# Patient Record
Sex: Female | Born: 1960
Health system: Southern US, Community
[De-identification: ages and names within clinical notes are randomized; demographics above are authoritative.]

## PROBLEM LIST (undated history)

## (undated) DIAGNOSIS — F900 Attention-deficit hyperactivity disorder, predominantly inattentive type: Secondary | ICD-10-CM

## (undated) DIAGNOSIS — S93409A Sprain of unspecified ligament of unspecified ankle, initial encounter: Secondary | ICD-10-CM

## (undated) DIAGNOSIS — C50919 Malignant neoplasm of unspecified site of unspecified female breast: Secondary | ICD-10-CM

## (undated) DIAGNOSIS — Z923 Personal history of irradiation: Secondary | ICD-10-CM

## (undated) DIAGNOSIS — IMO0001 Reserved for inherently not codable concepts without codable children: Secondary | ICD-10-CM

## (undated) DIAGNOSIS — F32A Depression, unspecified: Secondary | ICD-10-CM

## (undated) DIAGNOSIS — E039 Hypothyroidism, unspecified: Secondary | ICD-10-CM

## (undated) DIAGNOSIS — F329 Major depressive disorder, single episode, unspecified: Secondary | ICD-10-CM

## (undated) HISTORY — DX: Malignant neoplasm of unspecified site of unspecified female breast: C50.919

## (undated) HISTORY — PX: WISDOM TOOTH EXTRACTION: SHX21

## (undated) HISTORY — PX: DILATION AND EVACUATION: SHX1459

## (undated) HISTORY — DX: Reserved for inherently not codable concepts without codable children: IMO0001

## (undated) HISTORY — DX: Hypothyroidism, unspecified: E03.9

## (undated) HISTORY — DX: Major depressive disorder, single episode, unspecified: F32.9

## (undated) HISTORY — PX: OTHER SURGICAL HISTORY: SHX169

## (undated) HISTORY — DX: Depression, unspecified: F32.A

## (undated) HISTORY — DX: Attention-deficit hyperactivity disorder, predominantly inattentive type: F90.0

## (undated) HISTORY — DX: Sprain of unspecified ligament of unspecified ankle, initial encounter: S93.409A

## (undated) SURGERY — Surgical Case
Anesthesia: *Unknown

---

## 2000-12-01 ENCOUNTER — Other Ambulatory Visit: Admission: RE | Admit: 2000-12-01 | Discharge: 2000-12-01 | Payer: Self-pay | Admitting: Obstetrics & Gynecology

## 2000-12-03 ENCOUNTER — Encounter: Payer: Self-pay | Admitting: Obstetrics & Gynecology

## 2000-12-03 ENCOUNTER — Encounter: Admission: RE | Admit: 2000-12-03 | Discharge: 2000-12-03 | Payer: Self-pay | Admitting: Obstetrics & Gynecology

## 2000-12-06 ENCOUNTER — Ambulatory Visit (HOSPITAL_COMMUNITY): Admission: RE | Admit: 2000-12-06 | Discharge: 2000-12-06 | Payer: Self-pay | Admitting: Obstetrics & Gynecology

## 2000-12-06 ENCOUNTER — Encounter (INDEPENDENT_AMBULATORY_CARE_PROVIDER_SITE_OTHER): Payer: Self-pay | Admitting: Specialist

## 2000-12-14 ENCOUNTER — Inpatient Hospital Stay (HOSPITAL_COMMUNITY): Admission: AD | Admit: 2000-12-14 | Discharge: 2000-12-14 | Payer: Self-pay | Admitting: Obstetrics and Gynecology

## 2000-12-16 ENCOUNTER — Encounter (INDEPENDENT_AMBULATORY_CARE_PROVIDER_SITE_OTHER): Payer: Self-pay

## 2000-12-16 ENCOUNTER — Ambulatory Visit (HOSPITAL_COMMUNITY): Admission: RE | Admit: 2000-12-16 | Discharge: 2000-12-16 | Payer: Self-pay | Admitting: Obstetrics & Gynecology

## 2002-06-07 ENCOUNTER — Inpatient Hospital Stay (HOSPITAL_COMMUNITY): Admission: AD | Admit: 2002-06-07 | Discharge: 2002-06-07 | Payer: Self-pay | Admitting: Obstetrics and Gynecology

## 2002-06-08 ENCOUNTER — Encounter: Payer: Self-pay | Admitting: Obstetrics and Gynecology

## 2002-08-04 ENCOUNTER — Inpatient Hospital Stay (HOSPITAL_COMMUNITY): Admission: AD | Admit: 2002-08-04 | Discharge: 2002-08-04 | Payer: Self-pay | Admitting: Obstetrics and Gynecology

## 2002-09-26 ENCOUNTER — Inpatient Hospital Stay (HOSPITAL_COMMUNITY): Admission: AD | Admit: 2002-09-26 | Discharge: 2002-09-28 | Payer: Self-pay | Admitting: *Deleted

## 2002-09-29 ENCOUNTER — Encounter: Admission: RE | Admit: 2002-09-29 | Discharge: 2002-10-29 | Payer: Self-pay | Admitting: Obstetrics and Gynecology

## 2002-11-29 ENCOUNTER — Encounter: Admission: RE | Admit: 2002-11-29 | Discharge: 2002-12-29 | Payer: Self-pay | Admitting: Obstetrics and Gynecology

## 2004-08-21 ENCOUNTER — Ambulatory Visit: Payer: Self-pay | Admitting: Internal Medicine

## 2004-09-05 ENCOUNTER — Ambulatory Visit: Payer: Self-pay | Admitting: Internal Medicine

## 2004-12-09 ENCOUNTER — Ambulatory Visit: Payer: Self-pay | Admitting: Internal Medicine

## 2004-12-10 ENCOUNTER — Emergency Department (HOSPITAL_COMMUNITY): Admission: EM | Admit: 2004-12-10 | Discharge: 2004-12-10 | Payer: Self-pay | Admitting: Emergency Medicine

## 2004-12-11 ENCOUNTER — Ambulatory Visit: Payer: Self-pay | Admitting: Internal Medicine

## 2005-03-11 ENCOUNTER — Ambulatory Visit: Payer: Self-pay | Admitting: Internal Medicine

## 2005-07-21 ENCOUNTER — Ambulatory Visit: Payer: Self-pay | Admitting: Internal Medicine

## 2005-10-30 ENCOUNTER — Ambulatory Visit: Payer: Self-pay | Admitting: Internal Medicine

## 2007-05-30 ENCOUNTER — Encounter: Payer: Self-pay | Admitting: Internal Medicine

## 2007-07-15 ENCOUNTER — Telehealth: Payer: Self-pay | Admitting: Internal Medicine

## 2008-07-03 ENCOUNTER — Telehealth: Payer: Self-pay | Admitting: Internal Medicine

## 2009-12-18 ENCOUNTER — Ambulatory Visit: Payer: Self-pay | Admitting: Internal Medicine

## 2010-04-10 ENCOUNTER — Encounter: Payer: Self-pay | Admitting: Internal Medicine

## 2010-07-24 NOTE — Progress Notes (Signed)
Summary: RF  Phone Note Call from Patient Call back at [phone]   Caller: Patient-LIVE CALL Call For: DR K Summary of Call: RF BUDEPRION 87M 1QD. PLEASE CALL [facility] AT [phone]. Initial call taken by: Warnell Forester,  July 15, 2007 12:20 PM  Follow-up for Phone Call        Rx Called In Follow-up by: Raechel Ache, RN,  July 15, 2007 2:19 PM      Prescriptions: WELLBUTRIN XL 300 MG  TB24 (BUPROPION HCL) 1 once daily  #90 x 3   Entered by:   Raechel Ache, RN   Authorized by:   Gordy Savers  MD   Signed by:   Raechel Ache, RN on 07/15/2007   Method used:   Historical   RxID:   [id number]

## 2010-07-24 NOTE — Miscellaneous (Signed)
  Clinical Lists Changes  Medications: Added new medication of WELLBUTRIN XL 300 MG  TB24 (BUPROPION HCL) 1 once daily - Signed Added new medication of CELEXA 40 MG  TABS (CITALOPRAM HYDROBROMIDE) 1 once daily Rx of WELLBUTRIN XL 300 MG  TB24 (BUPROPION HCL) 1 once daily;  #34 x 0;  Signed;  Entered by: Raechel Ache, RN;  Authorized by: Gordy Savers  MD;  Method used: Historical Allergies: Added new allergy or adverse reaction of CODEINE PHOSPHATE (CODEINE PHOSPHATE) Observations: Added new observation of NKA: F (05/30/2007 13:49)    Prescriptions: WELLBUTRIN XL 300 MG  TB24 (BUPROPION HCL) 1 once daily  #34 x 0   Entered by:   Raechel Ache, RN   Authorized by:   Gordy Savers  MD   Signed by:   Raechel Ache, RN on 05/30/2007   Method used:   Historical   RxID:   [id number]

## 2010-07-24 NOTE — Progress Notes (Signed)
Summary: refill  Phone Note Call from Patient Call back at Home Phone [phone]   Caller: pt live Call For: K  Summary of Call: citalopram 40mg , budeprion xl 300 mg [facility]  Initial call taken by: Roselle Locus,  July 03, 2008 2:59 PM  Follow-up for Phone Call        cannot refill- needs OV- has been told this before. Follow-up by: Raechel Ache, RN,  July 03, 2008 3:13 PM  Additional Follow-up for Phone Call Additional follow up Details #1::        lm for patient to schedule appt Roselle Locus  July 03, 2008 4:54 PM

## 2010-07-24 NOTE — Assessment & Plan Note (Signed)
Summary: ankle pain-fup on meds/ccm   Vital Signs:  Patient profile:   50 year old female Height:      63.25 inches Weight:      127 pounds BMI:     22.40 Temp:     97.7 degrees F oral BP sitting:   120 / 78  (right arm) Cuff size:   regular  Vitals Entered By: Duard Brady LPN (December 18, 2009 10:14 AM) CC: c/o (R) ankle swelling and sprain - twisted 1 wk ago going up stairs Is Patient Diabetic? No   CC:  c/o (R) ankle swelling and sprain - twisted 1 wk ago going up stairs.  History of Present Illness: 50 year old patient has not been seen in our office in quite some time.  She does have a long history of depression.  More recently she has been followed by Dr. Allena Napoleon and has been on some thyroid supplementation.  Her depression has been stable.  Her chief complaint today is right ankle pain following a sprain about one week ago.  It is improving daily  Preventive Screening-Counseling & Management  Alcohol-Tobacco     Smoking Status: never  Allergies: 1)  ! Codeine Phosphate (Codeine Phosphate)  Past History:  Past Medical History: Depression  Social History: Smoking Status:  never  Review of Systems       The patient complains of difficulty walking and depression.  The patient denies anorexia, fever, weight loss, weight gain, vision loss, decreased hearing, hoarseness, chest pain, syncope, dyspnea on exertion, peripheral edema, prolonged cough, headaches, hemoptysis, abdominal pain, melena, hematochezia, severe indigestion/heartburn, hematuria, incontinence, genital sores, muscle weakness, suspicious skin lesions, transient blindness, unusual weight change, abnormal bleeding, enlarged lymph nodes, angioedema, and breast masses.    Physical Exam  General:  Well-developed,well-nourished,in no acute distress; alert,appropriate and cooperative throughout examination Msk:  soft tissue swelling and some resolving ecchymoses involving the right ankle both  medially and laterally.  She had no difficulty walking and bearing weight   Impression & Recommendations:  Problem # 1:  ANKLE SPRAIN, RIGHT (ICD-845.00)  Problem # 2:  DEPRESSION (ICD-311)  The following medications were removed from the medication list:    Wellbutrin Xl 300 Mg Tb24 (Bupropion hcl) .Marland Kitchen... 1 once daily    Celexa 40 Mg Tabs (Citalopram hydrobromide) .Marland Kitchen... 1 once daily Her updated medication list for this problem includes:    Bupropion Hcl 300 Mg Xr24h-tab (Bupropion hcl) ..... Qd    Celexa 20 Mg Tabs (Citalopram hydrobromide) ..... Qd  The following medications were removed from the medication list:    Wellbutrin Xl 300 Mg Tb24 (Bupropion hcl) .Marland Kitchen... 1 once daily    Celexa 40 Mg Tabs (Citalopram hydrobromide) .Marland Kitchen... 1 once daily Her updated medication list for this problem includes:    Bupropion Hcl 300 Mg Xr24h-tab (Bupropion hcl) ..... Qd    Celexa 20 Mg Tabs (Citalopram hydrobromide) ..... Qd  Complete Medication List: 1)  Bupropion Hcl 300 Mg Xr24h-tab (Bupropion hcl) .... Qd 2)  Celexa 20 Mg Tabs (Citalopram hydrobromide) .... Qd 3)  Cytomel 25 Mcg Tabs (Liothyronine sodium) .... Q am 4)  Synthroid 75 Mcg Tabs (Levothyroxine sodium) .... Q am  Patient Instructions: 1)  You may move around but avoid painful motions. Apply ice to sore area for 20 minutes 3-4 times a day for 2-3 days. 2)  Please schedule a follow-up appointment as needed. Prescriptions: CELEXA 20 MG TABS (CITALOPRAM HYDROBROMIDE) qd  #90 x 6   Entered and  Authorized by:   Gordy Savers  MD   Signed by:   Gordy Savers  MD on 12/18/2009   Method used:   Print then Give to Patient   RxID:   [id number] BUPROPION HCL 300 MG XR24H-TAB (BUPROPION HCL) qd  #90 x 6   Entered and Authorized by:   Gordy Savers  MD   Signed by:   Gordy Savers  MD on 12/18/2009   Method used:   Print then Give to Patient   RxID:   [id number]

## 2010-07-24 NOTE — Letter (Signed)
Summary: PATIENT HX FORMS  PATIENT HX FORMS   Imported By: Georgian Co 04/10/2010 12:24:42  _____________________________________________________________________  External Attachment:    Type:   Image     Comment:   External Document

## 2010-11-07 NOTE — Op Note (Signed)
[facility]  Patient:    Sarah Hall, Sarah Hall                   MRN: [id number] Proc. Date: 12/06/00 Adm. Date:  [id number] Attending:  Genia Del                           Operative Report  DATE OF BIRTH:                Jan 22, 1961.  PREOPERATIVE DIAGNOSES:       1. Missed abortion around 8 to [redacted] weeks                                  gestation.                               2. Acute toxoplasmosis in first trimester.  POSTOPERATIVE DIAGNOSES:      1. Missed abortion around 8 to [redacted] weeks                                  gestation.                               2. Acute toxoplasmosis in first trimester.  OPERATION:                    Dilatation and evacuation.  SURGEON:                      Genia Del, Hall.D.  ANESTHESIOLOGIST:             Raul Del, Hall.D.  ANESTHESIA:                   MAC and paracervical block.  ESTIMATED BLOOD LOSS:         50 cc.  DESCRIPTION OF PROCEDURE:     Under MAC analgesia, the patient is in lithotomy position. She is prepped with Betadine in the suprapubic, vulvar, and vaginal areas. The bladder is emptied with a catheter and the patient is draped as usual. The vaginal exam reveals an ______ uterus about 8 to [redacted] weeks gestation, mobile, no adnexal mass. The cervix is closed. There is no vaginal bleeding. The speculum is introduced. The anterior lip of the cervix is grasped with a tenaculum and the paracervical block is done with a total of 20 cc of lidocaine 1%, plain at 4 oclock and 8 oclock. We then start dilatation with Hegar dilators up to #29 without difficulty. We then use a curved #9 curet and proceed with suction. Products of conception corresponding to about 8 weeks are brought out. We then use the sharp curet to make sure that no products of conception are left in the cavity. The uterine sound is heard all over. We then suction one last time. The uterus contracts well on the curets.  Hemostasis is adequate. We therefore remove the instruments. Vaginal exam after the procedure confirmed a well-contracted uterus. Estimated blood loss is about 50 cc. No complication occurred and the patient was transferred to recovery room in good status. Note that Rh is negative; a RhoGAM vaccine will therefore be given.  Toxoplasmosis research was requested on products of conception. DD:  12/06/00 TD:  12/07/00 Job: [id number]

## 2010-11-07 NOTE — Op Note (Signed)
[facility]  Patient:    Sarah Hall, Sarah Hall                   MRN: [id number] Proc. Date: 12/16/00 Adm. Date:  [id number] Attending:  Genia Del                           Operative Report  DATE OF BIRTH:                1961-02-03  PREOPERATIVE DIAGNOSIS:       Possible retained products of conception versus endometritis with blood clots.  POSTOPERATIVE DIAGNOSIS:      Possible retained products of conception versus endometritis with blood clots.  OPERATION:                    Dilatation and evacuation.  SURGEON:                      Genia Del, Hall.D.  ANESTHESIOLOGIST:             ______  DESCRIPTION OF PROCEDURE:     Under MAC analgesia, the patient is in the lithotomy position.  She is prepped with Betadine in the suprapubic, vulvar, and vaginal areas.   The bladder is catheterized, and the patient is draped as usual.  The vaginal exam revealed an anteverted uterus slightly increased in volume, mobile, no adnexal mass.  The cervix is closed with no bleeding.  The speculum was inserted.  The paracervical block is done with Xylocaine 1%, 20 cc total at 4 and 8 oclock.  The tenaculum is applied on the anterior lip of the cervix.  Dilatation is achieved with Hegar dilators up to #29 easily.  We then used a #9 curved curet to proceed with suction curettage.  A small amount of products of conception versus dysidria are removed and will be sent to pathology, and toxoplasmosis was requested once again because of acute toxoplasmosis during the first trimester.   We feel that the uterus was contracting well under curet.  The sharp curet had been used to systematically curettage all surfaces of the uterine cavity.  The uterine sound is heard all over.  We then finish with the suction curet to remove any blood clot.  The uterus is contracting very well.  Instruments are removed.  The vaginal exam revealed an anteverted uterus, well  contracted, no adnexal mass.  Hemostasis is adequate.  Estimated blood loss is less than 50 cc.  No complications occurred, and the patient was brought to the recovery room in good status. RhoGAM was received on December 06, 2000, for Rh negative.  The patient will finish doxycycline and Methergine and follow up in office in two weeks. DD:  12/16/00 TD:  12/16/00 Job: [id number]

## 2011-01-08 ENCOUNTER — Other Ambulatory Visit: Payer: Self-pay | Admitting: Internal Medicine

## 2011-03-24 ENCOUNTER — Telehealth: Payer: Self-pay | Admitting: Internal Medicine

## 2011-03-24 NOTE — Telephone Encounter (Signed)
Pt is req to change pcps from Dr Amador Cunas to Dr Tawanna Cooler, because pts spouse is a pt of Dr Tawanna Cooler. Pls advise if ok. Pt needs to get est ov with Dr Tawanna Cooler asap, because pt on thyroid med and she has gained 12 lbs in 1 month.

## 2011-03-24 NOTE — Telephone Encounter (Signed)
ok 

## 2011-03-25 MED ORDER — LEVOTHYROXINE SODIUM 75 MCG PO TABS: 75.0000 ug | ORAL_TABLET | Freq: Every day | ORAL | Status: DC

## 2011-03-25 NOTE — Telephone Encounter (Signed)
Okay to refill thyroid for 30 days.  She needs a 30 minute appointment sometime in the next month for me to see her........ do a physical exam review .  Problem .

## 2011-04-13 ENCOUNTER — Encounter: Payer: Self-pay | Admitting: Family Medicine

## 2011-04-14 ENCOUNTER — Ambulatory Visit (INDEPENDENT_AMBULATORY_CARE_PROVIDER_SITE_OTHER): Payer: BC Managed Care – PPO | Admitting: Family Medicine

## 2011-04-14 ENCOUNTER — Encounter: Payer: Self-pay | Admitting: Family Medicine

## 2011-04-14 ENCOUNTER — Other Ambulatory Visit (HOSPITAL_COMMUNITY)
Admission: RE | Admit: 2011-04-14 | Discharge: 2011-04-14 | Disposition: A | Discharge status: Home / Self Care | Payer: BC Managed Care – PPO | Source: Ambulatory Visit | Attending: Family Medicine | Admitting: Family Medicine

## 2011-04-14 DIAGNOSIS — Z23 Encounter for immunization: Secondary | ICD-10-CM

## 2011-04-14 DIAGNOSIS — Z01419 Encounter for gynecological examination (general) (routine) without abnormal findings: Secondary | ICD-10-CM | POA: Insufficient documentation

## 2011-04-14 DIAGNOSIS — N6019 Diffuse cystic mastopathy of unspecified breast: Secondary | ICD-10-CM

## 2011-04-14 DIAGNOSIS — Z0000 Encounter for general adult medical examination without abnormal findings: Secondary | ICD-10-CM

## 2011-04-14 DIAGNOSIS — F3289 Other specified depressive episodes: Secondary | ICD-10-CM

## 2011-04-14 DIAGNOSIS — E039 Hypothyroidism, unspecified: Secondary | ICD-10-CM

## 2011-04-14 DIAGNOSIS — F329 Major depressive disorder, single episode, unspecified: Secondary | ICD-10-CM

## 2011-04-14 LAB — POCT URINALYSIS DIPSTICK
Bilirubin, UA: NEGATIVE
Glucose, UA: NEGATIVE
Ketones, UA: NEGATIVE
Leukocytes, UA: NEGATIVE
Nitrite, UA: NEGATIVE
Spec Grav, UA: 1.02
Urobilinogen, UA: 0.2
pH, UA: 7

## 2011-04-14 LAB — HEPATIC FUNCTION PANEL
ALT: 23 U/L (ref 0–35)
AST: 28 U/L (ref 0–37)
Albumin: 4.2 g/dL (ref 3.5–5.2)
Alkaline Phosphatase: 58 U/L (ref 39–117)
Bilirubin, Direct: 0 mg/dL (ref 0.0–0.3)
Total Bilirubin: 0.5 mg/dL (ref 0.3–1.2)
Total Protein: 7.2 g/dL (ref 6.0–8.3)

## 2011-04-14 LAB — CBC WITH DIFFERENTIAL/PLATELET
Basophils Absolute: 0 10*3/uL (ref 0.0–0.1)
Basophils Relative: 0.4 % (ref 0.0–3.0)
Eosinophils Absolute: 0.1 10*3/uL (ref 0.0–0.7)
Eosinophils Relative: 1.4 % (ref 0.0–5.0)
HCT: 38.9 % (ref 36.0–46.0)
Hemoglobin: 13.5 g/dL (ref 12.0–15.0)
Lymphocytes Relative: 44.5 % (ref 12.0–46.0)
Lymphs Abs: 1.9 10*3/uL (ref 0.7–4.0)
MCHC: 34.7 g/dL (ref 30.0–36.0)
MCV: 95.4 fL (ref 78.0–100.0)
Monocytes Absolute: 0.5 10*3/uL (ref 0.1–1.0)
Monocytes Relative: 11.9 % (ref 3.0–12.0)
Neutro Abs: 1.8 10*3/uL (ref 1.4–7.7)
Neutrophils Relative %: 41.8 % — ABNORMAL LOW (ref 43.0–77.0)
Platelets: 151 10*3/uL (ref 150.0–400.0)
RBC: 4.08 Mil/uL (ref 3.87–5.11)
RDW: 13.4 % (ref 11.5–14.6)
WBC: 4.2 10*3/uL — ABNORMAL LOW (ref 4.5–10.5)

## 2011-04-14 LAB — TSH: TSH: 3.22 u[IU]/mL (ref 0.35–5.50)

## 2011-04-14 LAB — LIPID PANEL
Cholesterol: 168 mg/dL (ref 0–200)
HDL: 76.6 mg/dL (ref >39.00)
LDL Cholesterol: 73 mg/dL (ref 0–99)
Total CHOL/HDL Ratio: 2
Triglycerides: 94 mg/dL (ref 0.0–149.0)
VLDL: 18.8 mg/dL (ref 0.0–40.0)

## 2011-04-14 LAB — T3, FREE: T3, Free: 2.8 pg/mL (ref 2.3–4.2)

## 2011-04-14 LAB — T4, FREE: Free T4: 0.74 ng/dL (ref 0.60–1.60)

## 2011-04-14 MED ORDER — BUPROPION HCL ER (XL) 300 MG PO TB24: 300.0000 mg | ORAL_TABLET | ORAL | Status: DC

## 2011-04-14 MED ORDER — CITALOPRAM HYDROBROMIDE 20 MG PO TABS: 20.0000 mg | ORAL_TABLET | Freq: Every day | ORAL | Status: DC

## 2011-04-14 NOTE — Progress Notes (Signed)
  Subjective:    Patient ID: Sarah Hall, female    DOB: 04/09/1961, 50 y.o.   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 Kirtland Bouchard.  She has a history of underlying depression, for which she takes Wellbutrin 300 mg daily, and Celexa 20 mg daily.  She's been on this for many years and feels well.  She has been seeing Allena Napoleon and was told she was hypothyroidism and was given Synthroid and Cytomel, however, she stopped those medications.  A year ago, and remains asymptomatic.  Records pending.  Advised that if she was going to see me that she would not also see Dr. [name] at the same time.  She gets routine eye care, dental care, does not check her breasts monthly,,,,,,, history of fibrocystic changes,,,,,,,, last mammogram 9 years ago.  Encouraged monthly BSE and annual mammography.  She does have light-skinned light.  Eyes and sun damage.  Recommend sunscreens SPF 50.  LMP a week ago, normal.  No birth control over one year.  Recommend they use condoms until she goes through menopause.  Tetanus booster and flu shot unknown.  She's had a cold for a week.     Review of Systems  Constitutional: Negative.   HENT: Negative.   Eyes: Negative.   Respiratory: Negative.   Cardiovascular: Negative.   Gastrointestinal: Negative.   Genitourinary: Negative.   Musculoskeletal: Negative.   Neurological: Negative.   Hematological: Negative.   Psychiatric/Behavioral: Negative.        Objective:   Physical Exam  Constitutional: She appears well-developed and well-nourished.  HENT:  Head: Normocephalic and atraumatic.  Right Ear: External ear normal.  Left Ear: External ear normal.  Nose: Nose normal.  Mouth/Throat: Oropharynx is clear and moist.  Eyes: EOM are normal. Pupils are equal, round, and reactive to light.  Neck: Normal range of motion. Neck  supple. No thyromegaly present.  Cardiovascular: Normal rate, regular rhythm, normal heart sounds and intact distal pulses.  Exam reveals no gallop and no friction rub.   No murmur heard. Pulmonary/Chest: Effort normal and breath sounds normal.  Abdominal: Soft. Bowel sounds are normal. She exhibits no distension and no mass. There is no tenderness. There is no rebound.  Genitourinary: Vagina normal and uterus normal. Guaiac negative stool. No vaginal discharge found.       Bilateral breast exam shows the breast to be some at home.  There are diffuse BB size of fibrocystic changes throughout both breasts.  There is a large marble-sized lesion right breast below the nipple.  It soft, rubbery, and movable.  Recommend mammogram ASAP  Musculoskeletal: Normal range of motion.  Lymphadenopathy:    She has no cervical adenopathy.  Neurological: She is alert. She has normal reflexes. No cranial nerve deficit. She exhibits normal muscle tone. Coordination normal.  Skin: Skin is warm and dry.  Psychiatric: She has a normal mood and affect. Her behavior is normal. Judgment and thought content normal.          Assessment & Plan:  Healthy female.  History of depression.  Continue Wellbutrin and Celexa.  History of hypothyroidism.  Check labs.  The fibrocystic breast changes.  Recommend BSE monthly and annual mammography.  Viral syndrome.  Treat symptomatically with Tylenol and Hydromet.  Return one year, sooner if any problems.  Tetanus booster and flu shot given today

## 2011-04-14 NOTE — Patient Instructions (Signed)
Continued ear, good health habits.  Remember to use sunscreens SPF 50.  I would recommend condoms for birth control until you go through menopause.  Return in one year or sooner if any problems.  Do a thorough breast exam monthly and called today to get set up for your annual mammogram.  Continue the Wellbutrin and Celexa.  We will call you in a couple days with report of your lab work

## 2011-06-04 ENCOUNTER — Encounter: Payer: Self-pay | Admitting: Family Medicine

## 2011-06-04 ENCOUNTER — Ambulatory Visit (INDEPENDENT_AMBULATORY_CARE_PROVIDER_SITE_OTHER): Payer: BC Managed Care – PPO | Admitting: Family Medicine

## 2011-06-04 VITALS — BP 120/80 | Temp 98.9°F | Wt 134.0 lb

## 2011-06-04 DIAGNOSIS — N390 Urinary tract infection, site not specified: Secondary | ICD-10-CM

## 2011-06-04 DIAGNOSIS — R300 Dysuria: Secondary | ICD-10-CM

## 2011-06-04 LAB — POCT URINALYSIS DIPSTICK
Bilirubin, UA: NEGATIVE
Blood, UA: NEGATIVE
Nitrite, UA: POSITIVE
Protein, UA: 1
Spec Grav, UA: 1.015
Urobilinogen, UA: 1
pH, UA: 7

## 2011-06-04 MED ORDER — SULFAMETHOXAZOLE-TRIMETHOPRIM 800-160 MG PO TABS: ORAL_TABLET | ORAL | Status: AC

## 2011-06-04 NOTE — Patient Instructions (Signed)
One p.o. B.i.d. Of the Septra x 10 days.  Return p.r.n.

## 2011-06-04 NOTE — Progress Notes (Signed)
  Subjective:    Patient ID: Quynh M [place], female    DOB: [birth year], 50 y.o.   MRN: [id number]  HPI Natalia Leatherwood is a 50 year old, married female, perimenopausal, who comes in today with a UTI.  Last night she began having frequency and dysuria.  No fever, chills, or back pain.  She is going through menopause with dry skin.  Dry, vagina, and hot flushes   Review of Systems General and neurologic review of systems otherwise negative    Objective:   Physical Exam Well-developed well-nourished, female, in no acute distress.  Examination of the abdomen, soft.  Bowel sounds normal.  No tenderness       Assessment & Plan:  UTI plan Septra DS b.i.d. Return p.r.n.  Question Premarin vaginal cream

## 2011-08-20 ENCOUNTER — Other Ambulatory Visit: Payer: Self-pay | Admitting: Family Medicine

## 2011-08-31 ENCOUNTER — Other Ambulatory Visit: Payer: Self-pay | Admitting: Family Medicine

## 2011-08-31 MED ORDER — LIOTHYRONINE SODIUM 25 MCG PO TABS: 25.0000 ug | ORAL_TABLET | Freq: Every day | ORAL | Status: DC

## 2011-08-31 NOTE — Telephone Encounter (Signed)
Pt need refill on cytomel call into [facility]

## 2011-12-31 ENCOUNTER — Ambulatory Visit (INDEPENDENT_AMBULATORY_CARE_PROVIDER_SITE_OTHER): Payer: BC Managed Care – PPO | Admitting: Family Medicine

## 2011-12-31 ENCOUNTER — Encounter: Payer: Self-pay | Admitting: Family Medicine

## 2011-12-31 VITALS — BP 124/80 | Temp 98.3°F | Wt 146.0 lb

## 2011-12-31 DIAGNOSIS — N6019 Diffuse cystic mastopathy of unspecified breast: Secondary | ICD-10-CM

## 2011-12-31 DIAGNOSIS — N951 Menopausal and female climacteric states: Secondary | ICD-10-CM | POA: Insufficient documentation

## 2011-12-31 DIAGNOSIS — F988 Other specified behavioral and emotional disorders with onset usually occurring in childhood and adolescence: Secondary | ICD-10-CM

## 2011-12-31 DIAGNOSIS — F3289 Other specified depressive episodes: Secondary | ICD-10-CM

## 2011-12-31 DIAGNOSIS — F329 Major depressive disorder, single episode, unspecified: Secondary | ICD-10-CM

## 2011-12-31 LAB — CBC WITH DIFFERENTIAL/PLATELET
Basophils Absolute: 0 10*3/uL (ref 0.0–0.1)
Basophils Relative: 0.5 % (ref 0.0–3.0)
Eosinophils Absolute: 0 10*3/uL (ref 0.0–0.7)
Eosinophils Relative: 0.3 % (ref 0.0–5.0)
HCT: 38.8 % (ref 36.0–46.0)
Hemoglobin: 13.1 g/dL (ref 12.0–15.0)
Lymphocytes Relative: 38.6 % (ref 12.0–46.0)
Lymphs Abs: 2.4 10*3/uL (ref 0.7–4.0)
MCHC: 33.7 g/dL (ref 30.0–36.0)
MCV: 94.8 fL (ref 78.0–100.0)
Monocytes Absolute: 0.5 10*3/uL (ref 0.1–1.0)
Monocytes Relative: 8.4 % (ref 3.0–12.0)
Neutro Abs: 3.2 10*3/uL (ref 1.4–7.7)
Neutrophils Relative %: 52.2 % (ref 43.0–77.0)
Platelets: 155 10*3/uL (ref 150.0–400.0)
RBC: 4.09 Mil/uL (ref 3.87–5.11)
RDW: 13.3 % (ref 11.5–14.6)
WBC: 6.2 10*3/uL (ref 4.5–10.5)

## 2011-12-31 LAB — TSH: TSH: 0.99 u[IU]/mL (ref 0.35–5.50)

## 2011-12-31 LAB — POCT URINALYSIS DIPSTICK
Bilirubin, UA: NEGATIVE
Blood, UA: NEGATIVE
Glucose, UA: NEGATIVE
Ketones, UA: NEGATIVE
Leukocytes, UA: NEGATIVE
Nitrite, UA: NEGATIVE
Protein, UA: NEGATIVE
Spec Grav, UA: >=1.03
Urobilinogen, UA: 0.2
pH, UA: 5.5

## 2011-12-31 LAB — BASIC METABOLIC PANEL
BUN: 19 mg/dL (ref 6–23)
CO2: 28 meq/L (ref 19–32)
Calcium: 9.4 mg/dL (ref 8.4–10.5)
Chloride: 104 meq/L (ref 96–112)
Creatinine, Ser: 0.7 mg/dL (ref 0.4–1.2)
GFR: 88.65 mL/min (ref >60.00)
Glucose, Bld: 85 mg/dL (ref 70–99)
Potassium: 3.9 meq/L (ref 3.5–5.1)
Sodium: 140 meq/L (ref 135–145)

## 2011-12-31 MED ORDER — AMPHETAMINE-DEXTROAMPHETAMINE 10 MG PO TABS: 10.0000 mg | ORAL_TABLET | Freq: Two times a day (BID) | ORAL | Status: DC

## 2011-12-31 MED ORDER — ETHYNODIOL DIAC-ETH ESTRADIOL 1-35 MG-MCG PO TABS: 1.0000 | ORAL_TABLET | Freq: Every day | ORAL | Status: DC

## 2011-12-31 NOTE — Patient Instructions (Signed)
Begin the BCPs tonight  Begin the Adderall 10 mg twice daily  Walk 30 minutes daily  Avoid caffeine  Call the breast Center today and get set up for your screening mammogram  Return in 3 weeks for followup

## 2011-12-31 NOTE — Progress Notes (Signed)
  Subjective:    Patient ID: Sarah Hall, female    DOB: Oct 17, 1960, 51 y.o.   MRN: [id number]  HPI Sarah Hall is a 51 year old married female nonsmoker who comes in today for evaluation of multiple issues  At age 51 she is beginning to go through menopause. Periods can range from 5-3 weeks last 4 days in duration and for the last 9 months she's been having hot flushes. No complaints of dry skin nor dry vagina.  She is complaining of fatigue and weight gain  We checked her thyroid levels off Cytomel and Synthroid 9 months ago and they were normal however she continues to take both medications. Advised her to stop those  She takes Celexa 20 mg a day bedtime along with Wellbutrin 300 mg in the morning because of a history of mild depression  She also has ADD. She was diagnosed as a teenager and has been resistant to taking any medication however she gets older her symptoms get worse. She's having difficulty with focus concentrating and getting anything done.  Also when we saw her 9 months ago we noticed a diffuse fibrocystic changes and a lesion in her right breast at 12:00. We advised to get a mammogram and she never went. She does not check her breasts monthly   Review of Systems    general GYN psychiatric review of systems otherwise negative Objective:   Physical Exam Well-developed well nourished female no acute distress ENT was negative neck was supple thyroid was not enlarged lungs clear to auscultation his cardiac exam normal breast exam shows bilateral cystic changes throughout both breasts the largest is a marble-sized lesion right breast at the 12:00 position just above her nipple. Abdominal exam negative total body skin exam normal       Assessment & Plan:  Perimenopausal with hot flashes plan begin BCPs  Adult ADD trial of Adderall 10 mg twice a day  Normal thyroid functions stop the thyroid supplement  History of fibrocystic changes call and get set up for your  mammogram  Followup with me in 3 weeks

## 2012-01-15 ENCOUNTER — Other Ambulatory Visit: Payer: Self-pay | Admitting: Obstetrics and Gynecology

## 2012-01-15 DIAGNOSIS — Z1231 Encounter for screening mammogram for malignant neoplasm of breast: Secondary | ICD-10-CM

## 2012-01-18 ENCOUNTER — Ambulatory Visit (INDEPENDENT_AMBULATORY_CARE_PROVIDER_SITE_OTHER): Payer: BC Managed Care – PPO | Admitting: Family Medicine

## 2012-01-18 ENCOUNTER — Encounter: Payer: Self-pay | Admitting: Family Medicine

## 2012-01-18 VITALS — BP 120/80 | Temp 98.7°F | Wt 142.0 lb

## 2012-01-18 DIAGNOSIS — F988 Other specified behavioral and emotional disorders with onset usually occurring in childhood and adolescence: Secondary | ICD-10-CM

## 2012-01-18 DIAGNOSIS — N951 Menopausal and female climacteric states: Secondary | ICD-10-CM

## 2012-01-18 DIAGNOSIS — N6019 Diffuse cystic mastopathy of unspecified breast: Secondary | ICD-10-CM

## 2012-01-18 MED ORDER — AMPHETAMINE-DEXTROAMPHETAMINE 20 MG PO TABS: ORAL_TABLET | ORAL | Status: DC

## 2012-01-18 NOTE — Patient Instructions (Signed)
Take 20 mg of Adderall daily in the morning and a second 10 mg dose around 2 PM  Return in 4 weeks for followup

## 2012-01-18 NOTE — Progress Notes (Signed)
  Subjective:    Patient ID: Sarah Hall, female    DOB: 25-Sep-1960, 51 y.o.   MRN: [id number]  HPI Sarah Hall is a 51 year old single female nonsmoker who comes in today for followup of 2 problems  She started Adderall 10 mg twice a day and doesn't see much improvement in her focus and concentration. Therefore increase the dose to 20 in the morning 10 in the evening  She also has the pre-menopausal hot flashes and did not start her BCPs. She states now hot flashes are diminishing and she wishes to stay off the birth control pills. She'll normal. The end of June and the beginning of July.  She has a history of fibrocystic breast changes and has rescheduled her mammogram for August.   Review of Systems General and psychiatric review of systems otherwise negative    Objective:   Physical Exam  Well-developed well-nourished female in no acute distress      Assessment & Plan:

## 2012-01-27 ENCOUNTER — Ambulatory Visit: Payer: BC Managed Care – PPO

## 2012-01-27 ENCOUNTER — Telehealth: Payer: Self-pay | Admitting: Family Medicine

## 2012-01-27 NOTE — Telephone Encounter (Signed)
Pt has questions regarding new Adderall RX she recently recieved. Pt would like to be contacted before she fills it Friday

## 2012-01-27 NOTE — Telephone Encounter (Signed)
Patient would like to increase Adderall 20  to twice daily.  She has made an appointment for Monday.

## 2012-02-01 ENCOUNTER — Encounter: Payer: Self-pay | Admitting: Family Medicine

## 2012-02-01 ENCOUNTER — Ambulatory Visit (INDEPENDENT_AMBULATORY_CARE_PROVIDER_SITE_OTHER): Payer: BC Managed Care – PPO | Admitting: Family Medicine

## 2012-02-01 VITALS — BP 120/80 | Temp 98.3°F | Wt 141.0 lb

## 2012-02-01 DIAGNOSIS — F988 Other specified behavioral and emotional disorders with onset usually occurring in childhood and adolescence: Secondary | ICD-10-CM

## 2012-02-01 MED ORDER — AMPHETAMINE-DEXTROAMPHETAMINE 20 MG PO TABS: ORAL_TABLET | ORAL | Status: DC

## 2012-02-01 NOTE — Patient Instructions (Signed)
Increase the Adderall to 20 mg twice daily if you don't see any improvement in this medication call Dr. Andee Poles for consultation

## 2012-02-01 NOTE — Progress Notes (Signed)
  Subjective:    Patient ID: Sarah Hall, female    DOB: 1961-05-29, 51 y.o.   MRN: [id number]  HPI [name] is a 51 year old female who comes in today for followup of ADD  We have her on 10 mg of plane in the morning and 10 mg around 3 PM however she doesn't feel like is helping. She states initially it that did but now seems to wear off rather quickly. No major side effects    Review of Systems Review of systems otherwise negative    Objective:   Physical Exam Well-developed well-nourished female no acute distress she really does not able to focus and concentrate she goes from one topic to the next       Assessment & Plan:

## 2012-02-09 ENCOUNTER — Inpatient Hospital Stay: Admission: RE | Admit: 2012-02-09 | Payer: BC Managed Care – PPO | Source: Ambulatory Visit

## 2012-02-12 ENCOUNTER — Ambulatory Visit: Payer: BC Managed Care – PPO

## 2012-02-29 ENCOUNTER — Ambulatory Visit
Admission: RE | Admit: 2012-02-29 | Discharge: 2012-02-29 | Disposition: A | Discharge status: Home / Self Care | Payer: BC Managed Care – PPO | Source: Ambulatory Visit | Attending: Obstetrics and Gynecology | Admitting: Obstetrics and Gynecology

## 2012-02-29 DIAGNOSIS — Z1231 Encounter for screening mammogram for malignant neoplasm of breast: Secondary | ICD-10-CM

## 2012-03-01 ENCOUNTER — Telehealth: Payer: Self-pay | Admitting: Family Medicine

## 2012-03-01 DIAGNOSIS — F988 Other specified behavioral and emotional disorders with onset usually occurring in childhood and adolescence: Secondary | ICD-10-CM

## 2012-03-01 NOTE — Telephone Encounter (Signed)
Pt called is is req a 1 month refill of amphetamine-dextroamphetamine (ADDERALL) 20 MG tablet. Pt takes 1 pill bid qty #60. Pt has schd an ov to see Dr Emerson Monte as recommended by Dr Tawanna Cooler, because the med is helping but not doing all it should do. Pt only wants 1 month supply to tide her over until her ov with Dr Nolen Mu.

## 2012-03-02 MED ORDER — AMPHETAMINE-DEXTROAMPHETAMINE 20 MG PO TABS: ORAL_TABLET | ORAL | Status: DC

## 2012-03-02 NOTE — Telephone Encounter (Signed)
Left message on machine for patient Rx ready for pick up 

## 2012-03-08 ENCOUNTER — Other Ambulatory Visit: Payer: Self-pay | Admitting: Obstetrics and Gynecology

## 2012-03-08 DIAGNOSIS — R928 Other abnormal and inconclusive findings on diagnostic imaging of breast: Secondary | ICD-10-CM

## 2012-03-10 ENCOUNTER — Ambulatory Visit
Admission: RE | Admit: 2012-03-10 | Discharge: 2012-03-10 | Disposition: A | Discharge status: Home / Self Care | Payer: BC Managed Care – PPO | Source: Ambulatory Visit | Attending: Obstetrics and Gynecology | Admitting: Obstetrics and Gynecology

## 2012-03-10 DIAGNOSIS — R928 Other abnormal and inconclusive findings on diagnostic imaging of breast: Secondary | ICD-10-CM

## 2012-04-01 ENCOUNTER — Other Ambulatory Visit: Payer: Self-pay | Admitting: Family Medicine

## 2012-04-01 DIAGNOSIS — F988 Other specified behavioral and emotional disorders with onset usually occurring in childhood and adolescence: Secondary | ICD-10-CM

## 2012-04-01 MED ORDER — AMPHETAMINE-DEXTROAMPHETAMINE 20 MG PO TABS: ORAL_TABLET | ORAL | Status: DC

## 2012-04-01 NOTE — Telephone Encounter (Signed)
rx's x3 ready for pick up

## 2012-04-01 NOTE — Telephone Encounter (Signed)
Attempt to call to confirm dose - 20mg  - plain ,1 pill twice daily - LMTCB to confirm- will have ready on monday

## 2012-04-01 NOTE — Telephone Encounter (Signed)
Pt called req 1 month refill of amphetamine-dextroamphetamine (ADDERALL) 20 MG tablet. Pt req to pick up on Monday, because pt will run out by then.

## 2012-04-01 NOTE — Telephone Encounter (Signed)
Pt callback to confirm dose below is correct. Pt is aware rx will be ready monday

## 2012-04-27 ENCOUNTER — Other Ambulatory Visit: Payer: Self-pay | Admitting: Family Medicine

## 2012-07-06 ENCOUNTER — Other Ambulatory Visit: Payer: Self-pay | Admitting: Family Medicine

## 2012-07-06 DIAGNOSIS — F988 Other specified behavioral and emotional disorders with onset usually occurring in childhood and adolescence: Secondary | ICD-10-CM

## 2012-07-06 MED ORDER — AMPHETAMINE-DEXTROAMPHETAMINE 20 MG PO TABS: ORAL_TABLET | ORAL | Status: DC

## 2012-07-06 MED ORDER — AMPHETAMINE-DEXTROAMPHETAMINE 20 MG PO TABS: 20.0000 mg | ORAL_TABLET | Freq: Two times a day (BID) | ORAL | Status: DC

## 2012-07-06 NOTE — Telephone Encounter (Signed)
Rx ready for pick up and patient is aware 

## 2012-07-06 NOTE — Telephone Encounter (Signed)
Pt needs refill of  amphetamine-dextroamphetamine (ADDERALL) 20 MG tablet.  

## 2012-09-20 ENCOUNTER — Telehealth: Payer: Self-pay | Admitting: Family Medicine

## 2012-09-20 ENCOUNTER — Encounter: Payer: Self-pay | Admitting: Internal Medicine

## 2012-09-20 ENCOUNTER — Ambulatory Visit (INDEPENDENT_AMBULATORY_CARE_PROVIDER_SITE_OTHER): Payer: BC Managed Care – PPO | Admitting: Internal Medicine

## 2012-09-20 VITALS — BP 110/80 | Temp 98.9°F | Wt 122.0 lb

## 2012-09-20 DIAGNOSIS — F988 Other specified behavioral and emotional disorders with onset usually occurring in childhood and adolescence: Secondary | ICD-10-CM

## 2012-09-20 DIAGNOSIS — S8010XA Contusion of unspecified lower leg, initial encounter: Secondary | ICD-10-CM

## 2012-09-20 DIAGNOSIS — S8011XA Contusion of right lower leg, initial encounter: Secondary | ICD-10-CM

## 2012-09-20 NOTE — Progress Notes (Signed)
  Subjective:    Patient ID: Sarah Hall, female    DOB: 1960/07/09, 52 y.o.   MRN: [id number]  HPI  52 year old patient who is seen today as a work in after sustaining trauma to her right anterior shin when she tripped over a log. This occurred less than one hour ago.  Past Medical History  Diagnosis Date  . Ankle sprain   . Depression     History   Social History  . Marital Status: Single    Spouse Name: N/A    Number of Children: N/A  . Years of Education: N/A   Occupational History  . Not on file.   Social History Main Topics  . Smoking status: Never Smoker   . Smokeless tobacco: Not on file  . Alcohol Use: Yes  . Drug Use: No  . Sexually Active:    Other Topics Concern  . Not on file   Social History Narrative  . No narrative on file    History reviewed. No pertinent past surgical history.  No family history on file.  Allergies  Allergen Reactions  . Codeine Phosphate     Current Outpatient Prescriptions on File Prior to Visit  Medication Sig Dispense Refill  . amphetamine-dextroamphetamine (ADDERALL) 20 MG tablet One tablet twice a day  60 tablet  0  . amphetamine-dextroamphetamine (ADDERALL) 20 MG tablet Take 1 tablet (20 mg total) by mouth 2 (two) times daily.  60 tablet  0  . amphetamine-dextroamphetamine (ADDERALL) 20 MG tablet One tablet twice a day  60 tablet  0  . buPROPion (WELLBUTRIN XL) 300 MG 24 hr tablet take 1 tablet by mouth every morning  90 tablet  1  . citalopram (CELEXA) 20 MG tablet take 1 tablet by mouth once daily  90 tablet  1  . SYNTHROID 75 MCG tablet take 1 tablet by mouth once daily  90 tablet  1   No current facility-administered medications on file prior to visit.    BP 110/80  Temp(Src) 98.9 F (37.2 C) (Oral)  Wt 122 lb (55.339 kg)  BMI 21.62 kg/m2       Review of Systems  Skin: Positive for wound.       Objective:   Physical Exam  Constitutional: She appears well-developed and well-nourished. No  distress.  Skin:  Prominent 4 cm hematoma right anterior shin          Assessment & Plan:   Hematoma right anterior shin. Will elevate and maintain ice for the next 24 hours; will use Tylenol or ibuprofen for pain.

## 2012-09-20 NOTE — Telephone Encounter (Signed)
Patient called stating that she need a refill of her adderall 20 mg 1po bid. Please assist.  °

## 2012-09-20 NOTE — Patient Instructions (Signed)
You  may move around, but avoid painful motions and activities.  Apply ice to the sore area for 15 to 20 minutes 3 or 4 times daily for the next two to 3 days.  Take Aleve 200 mg twice daily for pain or swelling

## 2012-09-22 MED ORDER — AMPHETAMINE-DEXTROAMPHETAMINE 20 MG PO TABS: ORAL_TABLET | ORAL | Status: DC

## 2012-09-22 MED ORDER — AMPHETAMINE-DEXTROAMPHETAMINE 20 MG PO TABS: 20.0000 mg | ORAL_TABLET | Freq: Two times a day (BID) | ORAL | Status: DC

## 2012-09-22 NOTE — Telephone Encounter (Signed)
Left message on machine Rx ready for pick up 

## 2013-10-19 ENCOUNTER — Ambulatory Visit: Payer: BC Managed Care – PPO | Admitting: Family Medicine

## 2014-02-22 ENCOUNTER — Ambulatory Visit (INDEPENDENT_AMBULATORY_CARE_PROVIDER_SITE_OTHER): Payer: BC Managed Care – PPO | Admitting: Family Medicine

## 2014-02-22 ENCOUNTER — Encounter: Payer: Self-pay | Admitting: Family Medicine

## 2014-02-22 DIAGNOSIS — J3089 Other allergic rhinitis: Secondary | ICD-10-CM

## 2014-02-22 DIAGNOSIS — J302 Other seasonal allergic rhinitis: Secondary | ICD-10-CM

## 2014-02-22 DIAGNOSIS — E038 Other specified hypothyroidism: Secondary | ICD-10-CM

## 2014-02-22 DIAGNOSIS — J309 Allergic rhinitis, unspecified: Secondary | ICD-10-CM | POA: Insufficient documentation

## 2014-02-22 DIAGNOSIS — J069 Acute upper respiratory infection, unspecified: Secondary | ICD-10-CM | POA: Insufficient documentation

## 2014-02-22 DIAGNOSIS — B9789 Other viral agents as the cause of diseases classified elsewhere: Secondary | ICD-10-CM

## 2014-02-22 LAB — CBC WITH DIFFERENTIAL/PLATELET
Basophils Absolute: 0 10*3/uL (ref 0.0–0.1)
Basophils Relative: 0.4 % (ref 0.0–3.0)
Eosinophils Absolute: 0.1 10*3/uL (ref 0.0–0.7)
Eosinophils Relative: 2 % (ref 0.0–5.0)
HCT: 38.4 % (ref 36.0–46.0)
Hemoglobin: 13 g/dL (ref 12.0–15.0)
Lymphocytes Relative: 25.7 % (ref 12.0–46.0)
Lymphs Abs: 1.7 10*3/uL (ref 0.7–4.0)
MCHC: 33.7 g/dL (ref 30.0–36.0)
MCV: 94 fL (ref 78.0–100.0)
Monocytes Absolute: 0.6 10*3/uL (ref 0.1–1.0)
Monocytes Relative: 8.4 % (ref 3.0–12.0)
Neutro Abs: 4.3 10*3/uL (ref 1.4–7.7)
Neutrophils Relative %: 63.5 % (ref 43.0–77.0)
Platelets: 190 10*3/uL (ref 150.0–400.0)
RBC: 4.09 Mil/uL (ref 3.87–5.11)
RDW: 13.1 % (ref 11.5–15.5)
WBC: 6.8 10*3/uL (ref 4.0–10.5)

## 2014-02-22 LAB — BASIC METABOLIC PANEL
BUN: 18 mg/dL (ref 6–23)
CO2: 31 meq/L (ref 19–32)
Calcium: 9.6 mg/dL (ref 8.4–10.5)
Chloride: 106 meq/L (ref 96–112)
Creatinine, Ser: 0.8 mg/dL (ref 0.4–1.2)
GFR: 80.32 mL/min (ref >60.00)
Glucose, Bld: 96 mg/dL (ref 70–99)
Potassium: 4.4 meq/L (ref 3.5–5.1)
Sodium: 144 meq/L (ref 135–145)

## 2014-02-22 LAB — T3, FREE: T3, Free: 2.6 pg/mL (ref 2.3–4.2)

## 2014-02-22 LAB — TSH: TSH: 2.02 u[IU]/mL (ref 0.35–4.50)

## 2014-02-22 LAB — T4, FREE: Free T4: 0.7 ng/dL (ref 0.60–1.60)

## 2014-02-22 MED ORDER — HYDROCODONE-HOMATROPINE 5-1.5 MG/5ML PO SYRP: 5.0000 mL | ORAL_SOLUTION | Freq: Three times a day (TID) | ORAL | Status: DC | PRN

## 2014-02-22 NOTE — Progress Notes (Signed)
   Subjective:    Patient ID: Sarah Hall, female    DOB: [birth year], 53 y.o.   MRN: [id number]  HPI Sarah Hall is a 53 year old female nonsmoker who comes in today for evaluation of 3 issues  We last saw HER-2 years ago for general physical examination. Her thyroid levels were normal therefore we discontinued her Synthroid. She feels like she's having symptoms of hypothyroidism and would like her thyroid level rechecked  She also has a history of allergic rhinitis and takes Zyrtec daily  5 days ago she developed head congestion sore throat and cough. No fever no sputum production  History of asthma but no wheezing now   Review of Systems Review of systems otherwise negative,,,,,,, she's seeing a psychiatrist for treatment of her adult ADD and depression    Objective:   Physical Exam  Well-developed well-nourished female no acute distress vital signs stable she's afebrile HEENT negative neck was supple no adenopathy lungs are clear      Assessment & Plan:  Allergic rhinitis........ continue Zyrtec  Viral syndrome....... treat symptomatically...... Hydromet when necessary  History of hypothyroidism........ check labs

## 2014-02-22 NOTE — Progress Notes (Signed)
Pre visit review using our clinic review tool, if applicable. No additional management support is needed unless otherwise documented below in the visit note. 

## 2014-02-22 NOTE — Patient Instructions (Signed)
Labs today.........Marland Kitchen we will call you next week with the report  Zyrtec 10 mg plain...Marland KitchenMarland KitchenMarland Kitchen 1 daily  Hydromet..........Marland Kitchen 1/2-1 teaspoon at bedtime when necessary for cough and cold  Thank lots of water  Return for CPX

## 2014-02-23 ENCOUNTER — Telehealth: Payer: Self-pay | Admitting: Family Medicine

## 2014-02-23 NOTE — Telephone Encounter (Signed)
Pt walked in to office requesting results of thyroid test.

## 2014-02-23 NOTE — Telephone Encounter (Signed)
Patient is aware of lab results.

## 2014-03-28 ENCOUNTER — Ambulatory Visit (INDEPENDENT_AMBULATORY_CARE_PROVIDER_SITE_OTHER): Payer: BC Managed Care – PPO | Admitting: Family Medicine

## 2014-03-28 ENCOUNTER — Encounter: Payer: Self-pay | Admitting: Family Medicine

## 2014-03-28 VITALS — BP 130/84 | Temp 97.6°F | Ht 63.0 in | Wt 131.0 lb

## 2014-03-28 DIAGNOSIS — Z0000 Encounter for general adult medical examination without abnormal findings: Secondary | ICD-10-CM | POA: Insufficient documentation

## 2014-03-28 NOTE — Progress Notes (Signed)
   Subjective:    Patient ID: Sarah Hall, female    DOB: [birth year], 53 y.o.   MRN: [id number]  HPI Cire is a 53 year old married female nonsmoker who comes in today for general physical examination  She's been followed in psychiatry and is on ADD medication and a new medication for depression.  She gets routine eye care, dental care, does not do BSE monthly, last mammogram a year and half ago. Her Paps and pelvics by GYN. She is due to have her first screening colonoscopy. Family history negative for colon cancer and polyps     Review of Systems  Constitutional: Negative.   HENT: Negative.   Eyes: Negative.   Respiratory: Negative.   Cardiovascular: Negative.   Gastrointestinal: Negative.   Genitourinary: Negative.   Musculoskeletal: Negative.   Neurological: Negative.   Psychiatric/Behavioral: Negative.        Objective:   Physical Exam  Nursing note and vitals reviewed. Constitutional: She appears well-developed and well-nourished.  HENT:  Head: Normocephalic and atraumatic.  Right Ear: External ear normal.  Left Ear: External ear normal.  Nose: Nose normal.  Mouth/Throat: Oropharynx is clear and moist.  Eyes: EOM are normal. Pupils are equal, round, and reactive to light.  Neck: Normal range of motion. Neck supple. No thyromegaly present.  Cardiovascular: Normal rate, regular rhythm, normal heart sounds and intact distal pulses.  Exam reveals no gallop and no friction rub.   No murmur heard. Pulmonary/Chest: Effort normal and breath sounds normal.  Abdominal: Soft. Bowel sounds are normal. She exhibits no distension and no mass. There is no tenderness. There is no rebound.  Genitourinary:  Breasts and pelvic deferred to GYN  Musculoskeletal: Normal range of motion.  Lymphadenopathy:    She has no cervical adenopathy.  Neurological: She is alert. She has normal reflexes. No cranial nerve deficit. She exhibits normal muscle tone. Coordination normal.    Skin: Skin is warm and dry.  Total body skin exam normal  Psychiatric: She has a normal mood and affect. Her behavior is normal. Judgment and thought content normal.          Assessment & Plan:  Healthy female  Depression...Marland KitchenMarland KitchenMarland Kitchen followed by psychiatry  ADD............. followed by psychiatry  Recommended a flu shot,,,,,,, patient declines,,,,,,,,,, will refer for first screening colonoscopy

## 2014-03-28 NOTE — Progress Notes (Signed)
Pre visit review using our clinic review tool, if applicable. No additional management support is needed unless otherwise documented below in the visit note. 

## 2014-03-28 NOTE — Patient Instructions (Signed)
Remember to walk 30 minutes daily  We will set you up for a consult in GI to discuss your her first screening colonoscopy

## 2014-06-19 ENCOUNTER — Encounter: Payer: Self-pay | Admitting: Gastroenterology

## 2014-07-10 ENCOUNTER — Ambulatory Visit (AMBULATORY_SURGERY_CENTER): Payer: Self-pay

## 2014-07-10 VITALS — Ht 63.0 in | Wt 137.2 lb

## 2014-07-10 DIAGNOSIS — Z1211 Encounter for screening for malignant neoplasm of colon: Secondary | ICD-10-CM

## 2014-07-10 MED ORDER — MOVIPREP 100 G PO SOLR: ORAL | Status: DC

## 2014-07-10 NOTE — Progress Notes (Signed)
Per pt, no allergies to soy or egg products.Pt not taking any weight loss meds or using  O2 at home. 

## 2014-07-13 ENCOUNTER — Encounter: Payer: BLUE CROSS/BLUE SHIELD | Admitting: Gastroenterology

## 2014-07-30 ENCOUNTER — Encounter: Payer: BC Managed Care – PPO | Admitting: Gastroenterology

## 2014-07-30 ENCOUNTER — Encounter: Payer: BLUE CROSS/BLUE SHIELD | Admitting: Gastroenterology

## 2014-08-14 ENCOUNTER — Telehealth: Payer: Self-pay | Admitting: Family Medicine

## 2014-08-14 NOTE — Telephone Encounter (Signed)
Pt is having pain on outer left side of her leg that shoots down to her foot and has had this pain for about 2 months or so. She would like to see Dr Sherren Mocha tomorrow 08/15/14 or should she see someone else.Sarah Hall

## 2014-08-15 NOTE — Telephone Encounter (Signed)
Spoke with patient and an appointment made for Monday

## 2014-08-20 ENCOUNTER — Ambulatory Visit (INDEPENDENT_AMBULATORY_CARE_PROVIDER_SITE_OTHER)
Admission: RE | Admit: 2014-08-20 | Discharge: 2014-08-20 | Disposition: A | Discharge status: Home / Self Care | Payer: BLUE CROSS/BLUE SHIELD | Source: Ambulatory Visit | Attending: Family Medicine | Admitting: Family Medicine

## 2014-08-20 ENCOUNTER — Ambulatory Visit (INDEPENDENT_AMBULATORY_CARE_PROVIDER_SITE_OTHER): Payer: BLUE CROSS/BLUE SHIELD | Admitting: Family Medicine

## 2014-08-20 VITALS — BP 140/98 | Temp 98.4°F | Wt 142.0 lb

## 2014-08-20 DIAGNOSIS — M5442 Lumbago with sciatica, left side: Secondary | ICD-10-CM

## 2014-08-20 DIAGNOSIS — M545 Low back pain, unspecified: Secondary | ICD-10-CM | POA: Insufficient documentation

## 2014-08-20 NOTE — Progress Notes (Signed)
   Subjective:    Patient ID: Sarah Hall, female    DOB: 03-06-1961, 54 y.o.   MRN: [id number]  HPI Sarah Hall is a 54 year old married female nonsmoker who comes in today for evaluation of low back pain  She states she's had low back pain off-and-on for many years over the last 3 months is become more severe and more frequent. She describes it is a sharp pain sometimes dull and she points the left lumbar area. Says it radiates down to her foot. When it's at its worst is a 5. The pain occurs very severely 3 or 4 times a week the last for couple hours. In between time she has a chronic soreness which he describes as dull in her back. She's been trying over-the-counter medicine and massage therapy to no avail.  She states 30 years ago she had a fracture of her sacrum.  She still has a period once or twice a year no urine or bowel problems     Review of Systems No fever chills weight loss night pain etc.    Objective:   Physical Exam  Well-developed well-nourished female no acute distress she is off her Adderall therefore it's very difficult to get a coherent history  Examination spine in the sitting position shows no palpable tenderness or scoliosis. In the supine position both legs reveal equal length. Sensation muscle strength reflexes within normal limits. Straight leg raising negative      Assessment & Plan:  Mechanical low back pain,,,,, x-ray to rule out underlying pathology,,,,,, begin physical therapy,,,,,, OTC Motrin

## 2014-08-20 NOTE — Patient Instructions (Signed)
Go to the main office now for x-rays of your back  I will call you the report  Once we see your x-ray report then we'll decide further therapy  In the meantime 600 mg of Motrin twice daily with food

## 2014-08-20 NOTE — Progress Notes (Signed)
Pre visit review using our clinic review tool, if applicable. No additional management support is needed unless otherwise documented below in the visit note. 

## 2014-09-03 ENCOUNTER — Ambulatory Visit (AMBULATORY_SURGERY_CENTER): Payer: BLUE CROSS/BLUE SHIELD | Admitting: Gastroenterology

## 2014-09-03 ENCOUNTER — Encounter: Payer: Self-pay | Admitting: Gastroenterology

## 2014-09-03 VITALS — BP 123/89 | HR 62 | Temp 97.8°F | Resp 15 | Ht 63.0 in | Wt 137.0 lb

## 2014-09-03 DIAGNOSIS — D128 Benign neoplasm of rectum: Secondary | ICD-10-CM

## 2014-09-03 DIAGNOSIS — K621 Rectal polyp: Secondary | ICD-10-CM | POA: Diagnosis not present

## 2014-09-03 DIAGNOSIS — D120 Benign neoplasm of cecum: Secondary | ICD-10-CM | POA: Diagnosis not present

## 2014-09-03 DIAGNOSIS — K635 Polyp of colon: Secondary | ICD-10-CM

## 2014-09-03 DIAGNOSIS — Z1211 Encounter for screening for malignant neoplasm of colon: Secondary | ICD-10-CM

## 2014-09-03 DIAGNOSIS — D129 Benign neoplasm of anus and anal canal: Secondary | ICD-10-CM

## 2014-09-03 HISTORY — PX: COLONOSCOPY: SHX174

## 2014-09-03 MED ORDER — SODIUM CHLORIDE 0.9 % IV SOLN: 500.0000 mL | INTRAVENOUS | Status: DC

## 2014-09-03 NOTE — Op Note (Signed)
[facility]  [address] [place], [id number]   COLONOSCOPY PROCEDURE REPORT  PATIENT: Hall, Sarah M  MR#: [id number] BIRTHDATE: 22-Jul-1960 , 53  yrs. old GENDER: female ENDOSCOPIST: Ladene Artist, MD, [facility] REFERRED LP:[id number] Delora Fuel, M.D. PROCEDURE DATE:  09/03/2014 PROCEDURE:   Colonoscopy, screening, Colonoscopy with biopsy, and Colonoscopy with snare polypectomy First Screening Colonoscopy - Avg.  risk and is 50 yrs.  old or older Yes.  Prior Negative Screening - Now for repeat screening. N/A  History of Adenoma - Now for follow-up colonoscopy & has been > or = to 3 yrs.  N/A ASA CLASS:   Class II INDICATIONS:Screening for colonic neoplasia and Colorectal Neoplasm Risk Assessment for this procedure is average risk. MEDICATIONS: Monitored anesthesia care and Propofol 250 mg IV DESCRIPTION OF PROCEDURE:   After the risks benefits and alternatives of the procedure were thoroughly explained, informed consent was obtained.  The digital rectal exam revealed no abnormalities of the rectum.   The LB [id number]  endoscope was introduced through the anus and advanced to the cecum, which was identified by both the appendix and ileocecal valve. No adverse events experienced.   The quality of the prep was excellent. (MoviPrep was used)  The instrument was then slowly withdrawn as the colon was fully examined.  COLON FINDINGS: A sessile polyp measuring 4 mm in size was found at the cecum.  A polypectomy was performed with cold forceps.  The resection was complete, the polyp tissue was completely retrieved and sent to histology. A sessile polyp measuring 6 mm in size was found in the rectum.  A polypectomy was performed with a cold snare.  The resection was complete, the polyp tissue was completely retrieved and sent to histology.  There was mild diverticulosis noted in the sigmoid colon.   The examination was otherwise normal. Retroflexed views revealed no  abnormalities. The time to cecum = 2.2 Withdrawal time = 8.9   The scope was withdrawn and the procedure completed. COMPLICATIONS: There were no immediate complications.  ENDOSCOPIC IMPRESSION: 1.   Sessile polyp at the cecum; polypectomy performed with cold forceps 2.   Sessile polyp in the rectum; polypectomy performed with a cold snare 3.   Mild diverticulosis in the sigmoid colon  RECOMMENDATIONS: 1.  Await pathology results 2.  Repeat colonoscopy in 5 years if polyp(s) adenomatous; otherwise 10 years 3.  High fiber diet with liberal fluid intake.  eSigned:  Ladene Artist, MD, [facility] 09/03/2014 10:12 AM

## 2014-09-03 NOTE — Progress Notes (Signed)
A/ox3, pleased with MAC, report to RN 

## 2014-09-03 NOTE — Patient Instructions (Signed)
YOU HAD AN ENDOSCOPIC PROCEDURE TODAY AT [facility]:   Refer to the procedure report that was given to you for any specific questions about what was found during the examination.  If the procedure report does not answer your questions, please call your gastroenterologist to clarify.  If you requested that your care partner not be given the details of your procedure findings, then the procedure report has been included in a sealed envelope for you to review at your convenience later.  YOU SHOULD EXPECT: Some feelings of bloating in the abdomen. Passage of more gas than usual.  Walking can help get rid of the air that was put into your GI tract during the procedure and reduce the bloating. If you had a lower endoscopy (such as a colonoscopy or flexible sigmoidoscopy) you may notice spotting of blood in your stool or on the toilet paper. If you underwent a bowel prep for your procedure, you may not have a normal bowel movement for a few days.  Please Note:  You might notice some irritation and congestion in your nose or some drainage.  This is from the oxygen used during your procedure.  There is no need for concern and it should clear up in a day or so.  SYMPTOMS TO REPORT IMMEDIATELY:   Following lower endoscopy (colonoscopy or flexible sigmoidoscopy):  Excessive amounts of blood in the stool  Significant tenderness or worsening of abdominal pains  Swelling of the abdomen that is new, acute  Fever of 100F or higher    For urgent or emergent issues, a gastroenterologist can be reached at any hour by calling [phone].   DIET: Your first meal following the procedure should be a small meal and then it is ok to progress to your normal diet. Heavy or fried foods are harder to digest and may make you feel nauseous or bloated.  Likewise, meals heavy in dairy and vegetables can increase bloating.  Drink plenty of fluids but you should avoid alcoholic beverages for 24  hours.  ACTIVITY:  You should plan to take it easy for the rest of today and you should NOT DRIVE or use heavy machinery until tomorrow (because of the sedation medicines used during the test).    FOLLOW UP: Our staff will call the number listed on your records the next business day following your procedure to check on you and address any questions or concerns that you may have regarding the information given to you following your procedure. If we do not reach you, we will leave a message.  However, if you are feeling well and you are not experiencing any problems, there is no need to return our call.  We will assume that you have returned to your regular daily activities without incident.  If any biopsies were taken you will be contacted by phone or by letter within the next 1-3 weeks.  Please call us at [phone] if you have not heard about the biopsies in 3 weeks.    SIGNATURES/CONFIDENTIALITY: You and/or your care partner have signed paperwork which will be entered into your electronic medical record.  These signatures attest to the fact that that the information above on your After Visit Summary has been reviewed and is understood.  Full responsibility of the confidentiality of this discharge information lies with you and/or your care-partner.    Information on polyps ,diverticulosis ,and high fiber diet given to you today

## 2014-09-03 NOTE — Progress Notes (Signed)
Called to room to assist during endoscopic procedure.  Patient ID and intended procedure confirmed with present staff. Received instructions for my participation in the procedure from the performing physician.  

## 2014-09-04 ENCOUNTER — Telehealth: Payer: Self-pay

## 2014-09-04 NOTE — Telephone Encounter (Signed)
Left message on answering machine. 

## 2014-09-07 ENCOUNTER — Encounter: Payer: Self-pay | Admitting: Gastroenterology

## 2014-09-10 ENCOUNTER — Encounter: Payer: Self-pay | Admitting: Family Medicine

## 2014-09-10 ENCOUNTER — Ambulatory Visit (INDEPENDENT_AMBULATORY_CARE_PROVIDER_SITE_OTHER): Payer: BLUE CROSS/BLUE SHIELD | Admitting: Family Medicine

## 2014-09-10 VITALS — BP 131/81 | HR 79 | Temp 98.1°F | Ht 63.0 in | Wt 147.0 lb

## 2014-09-10 DIAGNOSIS — F909 Attention-deficit hyperactivity disorder, unspecified type: Secondary | ICD-10-CM

## 2014-09-10 DIAGNOSIS — J309 Allergic rhinitis, unspecified: Secondary | ICD-10-CM

## 2014-09-10 DIAGNOSIS — F988 Other specified behavioral and emotional disorders with onset usually occurring in childhood and adolescence: Secondary | ICD-10-CM

## 2014-09-10 MED ORDER — AMPHETAMINE-DEXTROAMPHETAMINE 20 MG PO TABS: 20.0000 mg | ORAL_TABLET | Freq: Two times a day (BID) | ORAL | Status: DC

## 2014-09-10 NOTE — Progress Notes (Signed)
Pre visit review using our clinic review tool, if applicable. No additional management support is needed unless otherwise documented below in the visit note. 

## 2014-09-11 ENCOUNTER — Encounter: Payer: Self-pay | Admitting: Family Medicine

## 2014-09-11 NOTE — Progress Notes (Signed)
   Subjective:    Patient ID: Sarah Hall, female    DOB: 1960-09-11, 54 y.o.   MRN: [id number]  HPI Here for several issues. First she is out of Adderall and asks for a small supply until she can see Dr. Sherren Mocha again. Also for the past 2 weeks she has had sinus congestion, sneezing, and a slight dry cough. No ST or fever. She has a hx of allergies to molds and mildews and she takes OTC meds for these at certain times of the year, usually in the fall when the leaves come down. Of note, she had all her carpeting replaced inher house 2 weeks ago, and some of the old carpeting had some signs of mildew beneath it.    Review of Systems  Constitutional: Negative.   HENT: Positive for congestion, postnasal drip, rhinorrhea, sinus pressure and sneezing. Negative for sore throat.   Eyes: Negative.   Respiratory: Positive for cough.        Objective:   Physical Exam  Constitutional: She is oriented to person, place, and time. She appears well-developed and well-nourished.  HENT:  Right Ear: External ear normal.  Left Ear: External ear normal.  Nose: Nose normal.  Mouth/Throat: Oropharynx is clear and moist.  Eyes: Conjunctivae are normal.  Pulmonary/Chest: Effort normal and breath sounds normal.  Lymphadenopathy:    She has no cervical adenopathy.  Neurological: She is alert and oriented to person, place, and time.        Assessment & Plan:  I think the mildew beneath her old carpeting has caused a flare of her allergies. I suggested she try Flonase sprays and Zyrtec daily until this calms down. Refilled Adderall for one month.

## 2014-10-09 ENCOUNTER — Ambulatory Visit (INDEPENDENT_AMBULATORY_CARE_PROVIDER_SITE_OTHER): Payer: BLUE CROSS/BLUE SHIELD | Admitting: Family Medicine

## 2014-10-09 ENCOUNTER — Encounter: Payer: Self-pay | Admitting: Family Medicine

## 2014-10-09 VITALS — BP 122/84 | Temp 98.0°F | Wt 142.0 lb

## 2014-10-09 DIAGNOSIS — F988 Other specified behavioral and emotional disorders with onset usually occurring in childhood and adolescence: Secondary | ICD-10-CM

## 2014-10-09 DIAGNOSIS — F909 Attention-deficit hyperactivity disorder, unspecified type: Secondary | ICD-10-CM | POA: Diagnosis not present

## 2014-10-09 MED ORDER — AMPHETAMINE-DEXTROAMPHET ER 20 MG PO CP24: ORAL_CAPSULE | ORAL | Status: DC

## 2014-10-09 MED ORDER — AMPHETAMINE-DEXTROAMPHETAMINE 20 MG PO TABS: 20.0000 mg | ORAL_TABLET | Freq: Two times a day (BID) | ORAL | Status: DC

## 2014-10-09 NOTE — Patient Instructions (Signed)
Adderall 20 mg........ one twice daily  Call 2 weeks from being out of your third prescription for refills.......Marland Kitchen Rachel's extension is [phone]

## 2014-10-09 NOTE — Progress Notes (Signed)
Pre visit review using our clinic review tool, if applicable. No additional management support is needed unless otherwise documented below in the visit note. 

## 2014-10-09 NOTE — Progress Notes (Signed)
   Subjective:    Patient ID: Sarah Hall, female    DOB: 1961-01-25, 54 y.o.   MRN: [id number]  HPI Sarah Hall is a 54 year old married female nonsmoker who comes in today for follow-up of adult ADD  She's currently taking Adderall 20 mg twice a day. I referred her to see Dr. Caprice Beaver in group. They have her on a new antidepressant 3 mg daily. She says is the best medicine she's overtaken.   Review of Systems Review of systems otherwise negative    Objective:   Physical Exam Well-developed well-nourished female no acute distress vital signs stable she's afebrile       Assessment & Plan:  Adult ADD,,,,,,, continue Adderall 20 mg twice a day  History of depression,,,,,,, follow-up by psychiatry

## 2014-11-08 ENCOUNTER — Telehealth: Payer: Self-pay | Admitting: Family Medicine

## 2014-11-08 DIAGNOSIS — R5382 Chronic fatigue, unspecified: Secondary | ICD-10-CM

## 2014-11-08 DIAGNOSIS — M797 Fibromyalgia: Secondary | ICD-10-CM

## 2014-11-08 DIAGNOSIS — G4730 Sleep apnea, unspecified: Secondary | ICD-10-CM

## 2014-11-08 NOTE — Telephone Encounter (Signed)
Pt would like to have a sleep study done. Pt went to an endocrinologist due to her chronic fatigue. Thyroid issue was ruled out. But suggested sleep study.  Pt would like to know who dr todd would recommend to see if it might be fibromyalgia.

## 2014-11-08 NOTE — Telephone Encounter (Signed)
Per Dr Sherren Mocha - referrals placed and patient is aware.

## 2015-01-04 ENCOUNTER — Encounter: Payer: Self-pay | Admitting: Family Medicine

## 2015-01-04 ENCOUNTER — Telehealth: Payer: Self-pay | Admitting: Family Medicine

## 2015-01-04 ENCOUNTER — Ambulatory Visit (INDEPENDENT_AMBULATORY_CARE_PROVIDER_SITE_OTHER): Payer: BLUE CROSS/BLUE SHIELD | Admitting: Family Medicine

## 2015-01-04 VITALS — BP 116/82 | HR 99 | Temp 99.8°F | Ht 63.0 in | Wt 137.0 lb

## 2015-01-04 DIAGNOSIS — K529 Noninfective gastroenteritis and colitis, unspecified: Secondary | ICD-10-CM | POA: Diagnosis not present

## 2015-01-04 MED ORDER — CIPROFLOXACIN HCL 500 MG PO TABS: 500.0000 mg | ORAL_TABLET | Freq: Two times a day (BID) | ORAL | Status: DC

## 2015-01-04 MED ORDER — DIPHENOXYLATE-ATROPINE 2.5-0.025 MG PO TABS: 2.0000 | ORAL_TABLET | Freq: Four times a day (QID) | ORAL | Status: DC | PRN

## 2015-01-04 MED ORDER — AMPHETAMINE-DEXTROAMPHETAMINE 20 MG PO TABS: 20.0000 mg | ORAL_TABLET | Freq: Two times a day (BID) | ORAL | Status: DC

## 2015-01-04 NOTE — Progress Notes (Signed)
   Subjective:    Patient ID: Sarah Hall, female    DOB: 1961/01/21, 54 y.o.   MRN: [id number]  HPI Here for 3 days of mild generalized abdominal cramps, body aches, and HAs which started on a plane flight home from [place]. Her husband feels fine. She has had some nausea but has not vomited. Drinking fluids.    Review of Systems  Constitutional: Negative.   HENT: Negative.   Eyes: Negative.   Respiratory: Negative.   Cardiovascular: Negative.   Gastrointestinal: Positive for nausea, abdominal pain and diarrhea. Negative for vomiting, constipation, blood in stool, abdominal distention, anal bleeding and rectal pain.  Genitourinary: Negative.        Objective:   Physical Exam  Constitutional: She is oriented to person, place, and time. She appears well-developed and well-nourished. No distress.  Neck: No thyromegaly present.  Cardiovascular: Normal rate, regular rhythm, normal heart sounds and intact distal pulses.   Pulmonary/Chest: Effort normal and breath sounds normal.  Abdominal: Soft. Bowel sounds are normal. She exhibits no distension and no mass. There is no rebound and no guarding.  Mildly tender diffusely   Lymphadenopathy:    She has no cervical adenopathy.  Neurological: She is alert and oriented to person, place, and time.          Assessment & Plan:  Enteritis. Treat with Cipro. Drink plenty of water and Gatorade. Use Lomotil prn

## 2015-01-04 NOTE — Telephone Encounter (Signed)
We will do this at the [facility] today

## 2015-01-04 NOTE — Progress Notes (Signed)
Pre visit review using our clinic review tool, if applicable. No additional management support is needed unless otherwise documented below in the visit note. 

## 2015-01-04 NOTE — Telephone Encounter (Signed)
Pt is seeing dr fry at 1:30 today for an acute issue.  Pt is needing refill of amphetamine-dextroamphetamine (ADDERALL XR) 20 MG 24 hr capsule  3 mo supply  Pt states dr fry is the one who put her back on the adderral when dr todd not avail. Can you get her rx ready and see if dr fry will sign? Pt has cpe in sept w/ dr Sherren Mocha

## 2015-01-15 ENCOUNTER — Ambulatory Visit (INDEPENDENT_AMBULATORY_CARE_PROVIDER_SITE_OTHER): Payer: BLUE CROSS/BLUE SHIELD | Admitting: Pulmonary Disease

## 2015-01-15 ENCOUNTER — Encounter: Payer: Self-pay | Admitting: Pulmonary Disease

## 2015-01-15 VITALS — BP 118/82 | HR 82 | Ht 63.0 in | Wt 136.8 lb

## 2015-01-15 DIAGNOSIS — G4700 Insomnia, unspecified: Secondary | ICD-10-CM | POA: Insufficient documentation

## 2015-01-15 NOTE — Progress Notes (Signed)
Subjective:    Patient ID: Sarah Hall, female    DOB: 01-Feb-1961, 54 y.o.   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 She reports poor sleep all her life. She also reports chronic depression for at least the last 20 years for which she is seeing a therapist. She is on Brexiprazole for 4 months and on Adderall for at least 2 years. She wakes up 6-7 times during the night and often has trouble going back to sleep. She's been tired throughout the day. Epworth sleepiness score is 2-but she reports excessive daytime fatigue. Bedtime is around 11 PM, sleep latency about 20 minutes, she prefers to sleep in the fetal position with one pillow. She does not have a fixed wake up time, and wakes up according to schedule as soon as 6:30 AM and as late sometimes is 11 AM. She wakes up feeling tired with occasional headaches and dryness of mouth. She feels the same regardless of whether she has slept 5 hours or 10 hours. She exercises 3 times a week. She has been able to cut out caffeine from her diet completely. She drinks up to 2 glasses of wine and sometimes closer to bedtime,  There is no history suggestive of cataplexy, sleep paralysis or parasomnias  She has used Ambien and Lunesta in the past-but this created a hangover effect  Past Medical History  Diagnosis Date  . Ankle sprain     right ankle  . Depression   . Deep breathing     hard to take a deep breath due to thyroid issue    Past Surgical History  Procedure Laterality Date  . Wisdom tooth extraction    . Dilation and evacuation       2 times  . Scraping of uterus      20 years ago   Allergies  Allergen Reactions  . Codeine Phosphate     Can take hydrocodone/Codeine cause anxiousness    History   Social History  . Marital Status: Single    Spouse Name: N/A  . Number of Children: N/A  . Years of Education: N/A   Occupational History  . Not on file.     Social History Main Topics  . Smoking status: Never Smoker   . Smokeless tobacco: Never Used  . Alcohol Use: 6.0 oz/week    10 Glasses of wine per week  . Drug Use: No  . Sexual Activity: Not on file   Other Topics Concern  . Not on file   Social History Narrative    Family History  Problem Relation Age of Onset  . Alzheimer's disease Father   . Colon cancer Neg Hx   . Esophageal cancer Neg Hx   . Rectal cancer Neg Hx   . Stomach cancer Neg Hx      Review of Systems neg for any significant sore throat, dysphagia, itching, sneezing, nasal congestion or excess/ purulent secretions, fever, chills, sweats, unintended wt loss, pleuritic or exertional cp, hempoptysis, orthopnea pnd or change in chronic leg swelling. Also denies presyncope, palpitations, heartburn, abdominal pain, nausea, vomiting, diarrhea or change in bowel or urinary habits, dysuria,hematuria, rash, arthralgias, visual complaints, headache, numbness weakness or ataxia.     Objective:   Physical Exam  Gen. Pleasant, well-nourished, in no distress, normal affect ENT - no lesions, no post nasal drip Neck: No JVD, no thyromegaly, no carotid bruits Lungs: no use of accessory muscles, no dullness  to percussion, clear without rales or rhonchi  Cardiovascular: Rhythm regular, heart sounds  normal, no murmurs or gallops, no peripheral edema Abdomen: soft and non-tender, no hepatosplenomegaly, BS normal. Musculoskeletal: No deformities, no cyanosis or clubbing Neuro:  alert, non focal       Assessment & Plan:

## 2015-01-15 NOTE — Patient Instructions (Signed)
Rules of sleep hygiene were discussed  - light exercise -avoid caffeinated beverages - no more than 20 mins staying awake in bed, if not asleep, get out of bed & reading or light music - No TV or computer games at bedtime. -Set up fixed wake up time -Sunlight exposure 20-30 mins in am  Trial of melatonin 5-10 mg 2h prior to bedtime Contact sleep therapist if no improvement with above

## 2015-01-15 NOTE — Assessment & Plan Note (Signed)
Rules of sleep hygiene were discussed  - light exercise -avoid caffeinated beverages - no more than 20 mins staying awake in bed, if not asleep, get out of bed & reading or light music - No TV or computer games at bedtime. -Set up fixed wake up time -Sunlight exposure 20-30 mins in am -Avoid alcohol at bedtime  Trial of melatonin 5-10 mg 2h prior to bedtime Contact sleep therapist if no improvement with above

## 2015-03-05 ENCOUNTER — Other Ambulatory Visit: Payer: Self-pay

## 2015-03-05 DIAGNOSIS — Z1231 Encounter for screening mammogram for malignant neoplasm of breast: Secondary | ICD-10-CM

## 2015-03-06 ENCOUNTER — Other Ambulatory Visit (INDEPENDENT_AMBULATORY_CARE_PROVIDER_SITE_OTHER): Payer: BLUE CROSS/BLUE SHIELD

## 2015-03-06 DIAGNOSIS — Z0000 Encounter for general adult medical examination without abnormal findings: Secondary | ICD-10-CM | POA: Diagnosis not present

## 2015-03-06 LAB — BASIC METABOLIC PANEL
BUN: 14 mg/dL (ref 6–23)
CO2: 28 meq/L (ref 19–32)
Calcium: 9.6 mg/dL (ref 8.4–10.5)
Chloride: 107 meq/L (ref 96–112)
Creatinine, Ser: 0.73 mg/dL (ref 0.40–1.20)
GFR: 88.91 mL/min (ref >60.00)
Glucose, Bld: 104 mg/dL — ABNORMAL HIGH (ref 70–99)
Potassium: 3.9 meq/L (ref 3.5–5.1)
Sodium: 143 meq/L (ref 135–145)

## 2015-03-06 LAB — CBC WITH DIFFERENTIAL/PLATELET
Basophils Absolute: 0 10*3/uL (ref 0.0–0.1)
Basophils Relative: 0.5 % (ref 0.0–3.0)
Eosinophils Absolute: 0 10*3/uL (ref 0.0–0.7)
Eosinophils Relative: 0.5 % (ref 0.0–5.0)
HCT: 38.3 % (ref 36.0–46.0)
Hemoglobin: 12.8 g/dL (ref 12.0–15.0)
Lymphocytes Relative: 33.8 % (ref 12.0–46.0)
Lymphs Abs: 2.2 10*3/uL (ref 0.7–4.0)
MCHC: 33.5 g/dL (ref 30.0–36.0)
MCV: 93.8 fL (ref 78.0–100.0)
Monocytes Absolute: 0.4 10*3/uL (ref 0.1–1.0)
Monocytes Relative: 6.1 % (ref 3.0–12.0)
Neutro Abs: 3.9 10*3/uL (ref 1.4–7.7)
Neutrophils Relative %: 59.1 % (ref 43.0–77.0)
Platelets: 190 10*3/uL (ref 150.0–400.0)
RBC: 4.08 Mil/uL (ref 3.87–5.11)
RDW: 13.4 % (ref 11.5–15.5)
WBC: 6.6 10*3/uL (ref 4.0–10.5)

## 2015-03-06 LAB — HEPATIC FUNCTION PANEL
ALT: 9 U/L (ref 0–35)
AST: 15 U/L (ref 0–37)
Albumin: 4.4 g/dL (ref 3.5–5.2)
Alkaline Phosphatase: 55 U/L (ref 39–117)
Bilirubin, Direct: 0.1 mg/dL (ref 0.0–0.3)
Total Bilirubin: 0.6 mg/dL (ref 0.2–1.2)
Total Protein: 6.9 g/dL (ref 6.0–8.3)

## 2015-03-06 LAB — LIPID PANEL
Cholesterol: 169 mg/dL (ref 0–200)
HDL: 82.8 mg/dL
LDL Cholesterol: 71 mg/dL (ref 0–99)
NonHDL: 86.44
Total CHOL/HDL Ratio: 2
Triglycerides: 75 mg/dL (ref 0.0–149.0)
VLDL: 15 mg/dL (ref 0.0–40.0)

## 2015-03-06 LAB — POCT URINALYSIS DIPSTICK
Bilirubin, UA: NEGATIVE
Blood, UA: NEGATIVE
Glucose, UA: NEGATIVE
Ketones, UA: NEGATIVE
Nitrite, UA: NEGATIVE
Protein, UA: NEGATIVE
Spec Grav, UA: 1.015
Urobilinogen, UA: 0.2
pH, UA: 7

## 2015-03-06 LAB — TSH: TSH: 3.42 u[IU]/mL (ref 0.35–4.50)

## 2015-03-08 ENCOUNTER — Ambulatory Visit: Payer: BLUE CROSS/BLUE SHIELD

## 2015-03-13 ENCOUNTER — Ambulatory Visit (INDEPENDENT_AMBULATORY_CARE_PROVIDER_SITE_OTHER): Payer: BLUE CROSS/BLUE SHIELD | Admitting: Family Medicine

## 2015-03-13 ENCOUNTER — Encounter: Payer: Self-pay | Admitting: Family Medicine

## 2015-03-13 VITALS — BP 120/80 | Temp 98.5°F | Ht 63.0 in | Wt 136.0 lb

## 2015-03-13 DIAGNOSIS — F3130 Bipolar disorder, current episode depressed, mild or moderate severity, unspecified: Secondary | ICD-10-CM

## 2015-03-13 DIAGNOSIS — F909 Attention-deficit hyperactivity disorder, unspecified type: Secondary | ICD-10-CM | POA: Diagnosis not present

## 2015-03-13 DIAGNOSIS — Z0000 Encounter for general adult medical examination without abnormal findings: Secondary | ICD-10-CM | POA: Diagnosis not present

## 2015-03-13 DIAGNOSIS — F988 Other specified behavioral and emotional disorders with onset usually occurring in childhood and adolescence: Secondary | ICD-10-CM

## 2015-03-13 DIAGNOSIS — F319 Bipolar disorder, unspecified: Secondary | ICD-10-CM

## 2015-03-13 MED ORDER — AMPHETAMINE-DEXTROAMPHET ER 20 MG PO CP24: ORAL_CAPSULE | ORAL | Status: DC

## 2015-03-13 MED ORDER — AMPHETAMINE-DEXTROAMPHETAMINE 20 MG PO TABS: 20.0000 mg | ORAL_TABLET | Freq: Two times a day (BID) | ORAL | Status: DC

## 2015-03-13 NOTE — Progress Notes (Signed)
Pre visit review using our clinic review tool, if applicable. No additional management support is needed unless otherwise documented below in the visit note. 

## 2015-03-13 NOTE — Progress Notes (Signed)
Influenza immunization was not given due to patient refusal.

## 2015-03-13 NOTE — Progress Notes (Signed)
   Subjective:    Patient ID: Sarah Hall, female    DOB: 1961-03-01, 54 y.o.   MRN: [id number]  HPI Sarah Hall is a 54 year old married female nonsmoker who comes in today for general physical examination because of a history of depression and adult ADD  We have her on Adderall 20 mg twice a day for adult ADD and she's doing well. I encouraged her to see Dr. Arvil Persons group. She went to was diagnosed to have bipolar depression. She is on 2 medications and is doing very well since she started the lithium 2 months ago.  She gets routine eye care, dental care, BSE sporadically, and you mammography, however the last time she went was 2013. She is due to go this fall she'll he has a date an appointment.  Pap by GYN  Vaccinations history she is due a flu shot but declines   Review of Systems  Constitutional: Negative.   HENT: Negative.   Eyes: Negative.   Respiratory: Negative.   Cardiovascular: Negative.   Gastrointestinal: Negative.   Endocrine: Negative.   Genitourinary: Negative.   Musculoskeletal: Negative.   Skin: Negative.   Allergic/Immunologic: Negative.   Neurological: Negative.   Hematological: Negative.   Psychiatric/Behavioral: Negative.        Objective:   Physical Exam  Constitutional: She appears well-developed and well-nourished.  HENT:  Head: Normocephalic and atraumatic.  Right Ear: External ear normal.  Left Ear: External ear normal.  Nose: Nose normal.  Mouth/Throat: Oropharynx is clear and moist.  Eyes: EOM are normal. Pupils are equal, round, and reactive to light.  Neck: Normal range of motion. Neck supple. No JVD present. No tracheal deviation present. No thyromegaly present.  Cardiovascular: Normal rate, regular rhythm, normal heart sounds and intact distal pulses.  Exam reveals no gallop and no friction rub.   No murmur heard. Pulmonary/Chest: Effort normal and breath sounds normal. No stridor. No respiratory distress. She has no wheezes.  She has no rales. She exhibits no tenderness.  Abdominal: Soft. Bowel sounds are normal. She exhibits no distension and no mass. There is no tenderness. There is no rebound and no guarding.  Genitourinary:  Bilateral breast exam normal  Musculoskeletal: Normal range of motion.  Lymphadenopathy:    She has no cervical adenopathy.  Neurological: She is alert. She has normal reflexes. No cranial nerve deficit. She exhibits normal muscle tone. Coordination normal.  Skin: Skin is warm and dry. No rash noted. No erythema. No pallor.  Total body skin exam normal  Psychiatric: She has a normal mood and affect. Her behavior is normal. Judgment and thought content normal.  Nursing note and vitals reviewed.         Assessment & Plan:  Healthy female  History of bipolar depression........ continue medications via psychiatry  History of adult ADD......Marland Kitchen Adderall 20 mg twice a day

## 2015-03-13 NOTE — Patient Instructions (Signed)
Continue current medications  Follow-up in one year sooner if any problems  Cory nafzinger............ our new adult nurse practitioner from [facility] 

## 2015-03-18 ENCOUNTER — Ambulatory Visit
Admission: RE | Admit: 2015-03-18 | Discharge: 2015-03-18 | Disposition: A | Discharge status: Home / Self Care | Payer: BLUE CROSS/BLUE SHIELD | Source: Ambulatory Visit

## 2015-03-18 DIAGNOSIS — Z1231 Encounter for screening mammogram for malignant neoplasm of breast: Secondary | ICD-10-CM

## 2015-05-19 ENCOUNTER — Ambulatory Visit (INDEPENDENT_AMBULATORY_CARE_PROVIDER_SITE_OTHER): Payer: BLUE CROSS/BLUE SHIELD | Admitting: Physician Assistant

## 2015-05-19 VITALS — BP 110/64 | HR 84 | Temp 98.3°F | Resp 14 | Ht 63.0 in | Wt 138.0 lb

## 2015-05-19 DIAGNOSIS — H00013 Hordeolum externum right eye, unspecified eyelid: Secondary | ICD-10-CM | POA: Diagnosis not present

## 2015-05-19 NOTE — Patient Instructions (Signed)
Stye A stye is a bump on your eyelid caused by a bacterial infection. A stye can form inside the eyelid (internal stye) or outside the eyelid (external stye). An internal stye may be caused by an infected oil-producing gland inside your eyelid. An external stye may be caused by an infection at the base of your eyelash (hair follicle). Styes are very common. Anyone can get them at any age. They usually occur in just one eye, but you may have more than one in either eye.  CAUSES  The infection is almost always caused by bacteria called Staphylococcus aureus. This is a common type of bacteria that lives on your skin. RISK FACTORS You may be at higher risk for a stye if you have had one before. You may also be at higher risk if you have:  Diabetes.  Long-term illness.  Long-term eye redness.  A skin condition called seborrhea.  High fat levels in your blood (lipids). SIGNS AND SYMPTOMS  Eyelid pain is the most common symptom of a stye. Internal styes are more painful than external styes. Other signs and symptoms may include:  Painful swelling of your eyelid.  A scratchy feeling in your eye.  Tearing and redness of your eye.  Pus draining from the stye. DIAGNOSIS  Your health care provider may be able to diagnose a stye just by examining your eye. The health care provider may also check to make sure:  You do not have a fever or other signs of a more serious infection.  The infection has not spread to other parts of your eye or areas around your eye. TREATMENT  Most styes will clear up in a few days without treatment. In some cases, you may need to use antibiotic drops or ointment to prevent infection. Your health care provider may have to drain the stye surgically if your stye is:  Large.  Causing a lot of pain.  Interfering with your vision. This can be done using a thin blade or a needle.  HOME CARE INSTRUCTIONS   Take medicines only as directed by your health care  provider.  Apply a clean, warm compress to your eye for 10 minutes, 4 times a day.  Do not wear contact lenses or eye makeup until your stye has healed.  Do not try to pop or drain the stye. SEEK MEDICAL CARE IF:  You have chills or a fever.  Your stye does not go away after several days.  Your stye affects your vision.  Your eyeball becomes swollen, red, or painful. MAKE SURE YOU:  Understand these instructions.  Will watch your condition.  Will get help right away if you are not doing well or get worse.   This information is not intended to replace advice given to you by your health care provider. Make sure you discuss any questions you have with your health care provider.   Document Released: 03/18/2005 Document Revised: 06/29/2014 Document Reviewed: 09/22/2013 Elsevier Interactive Patient Education 2016 Elsevier Inc.  

## 2015-05-19 NOTE — Progress Notes (Signed)
[facility] [address], [place]  [id number] [phone]  Date:  05/19/2015   Name:  Sarah Hall   DOB:  1961/04/08   MRN:  [id number]  PCP:  Joycelyn Man, MD    Chief Complaint: Eye Problem   History of Present Illness:  This is a 54 y.o. female with PMH bipolar depression, ADD, insomnia, allergic rhinitis who is presenting with right upper eyelid swelling x 2 days. There is a pustule at the tip of her eyelid. It is mildly painful with palpation but otherwise does not bother her. She denies vision change, eyeball redness, drainage. She has not tried anything for her symptoms yet. She does not wear contacts. She is worried because she has a family portrait planned in 2 days.  Review of Systems:  Review of Systems See HPI  Patient Active Problem List   Diagnosis Date Noted  . Bipolar depression ([place]) 03/13/2015  . Insomnia 01/15/2015  . Low back pain 08/20/2014  . Allergic rhinitis 02/22/2014  . ADD (attention deficit disorder) 12/31/2011  . Perimenopausal vasomotor symptoms 12/31/2011  . Fibrocystic breast disease 04/14/2011  . ANKLE SPRAIN, RIGHT 12/18/2009    Prior to Admission medications   Medication Sig Start Date End Date Taking? Authorizing Provider  amphetamine-dextroamphetamine (ADDERALL XR) 20 MG 24 hr capsule One by mouth twice a day 03/13/15  Yes Dorena Cookey, MD  amphetamine-dextroamphetamine (ADDERALL) 20 MG tablet Take 1 tablet (20 mg total) by mouth 2 (two) times daily. 03/13/15  Yes Dorena Cookey, MD  B Complex Vitamins (B COMPLEX PO) Take by mouth daily.   Yes Historical Provider, MD  Brexpiprazole 2 MG TABS Take 3 mg by mouth daily.    Yes Historical Provider, MD  lithium carbonate (LITHOBID) 300 MG CR tablet 100 mg. 02/12/15  Yes Historical Provider, MD  magnesium 30 MG tablet Take 300 mg by mouth daily.    Yes Historical Provider, MD  Multiple Vitamin (MULTIVITAMIN) tablet Take 1 tablet by mouth daily.   Yes  Historical Provider, MD  Probiotic Product (PROBIOTIC DAILY PO) Take by mouth. Perfect Biotics-take one daily   Yes Historical Provider, MD    Allergies  Allergen Reactions  . Codeine Phosphate     Can take hydrocodone/Codeine cause anxiousness    Past Surgical History  Procedure Laterality Date  . Wisdom tooth extraction    . Dilation and evacuation       2 times  . Scraping of uterus      20 years ago    Social History  Substance Use Topics  . Smoking status: Never Smoker   . Smokeless tobacco: Never Used  . Alcohol Use: 6.0 oz/week    10 Glasses of wine per week    Family History  Problem Relation Age of Onset  . Alzheimer's disease Father   . Colon cancer Neg Hx   . Esophageal cancer Neg Hx   . Rectal cancer Neg Hx   . Stomach cancer Neg Hx     Medication list has been reviewed and updated.  Physical Examination:   Physical Exam  Constitutional: She is oriented to person, place, and time. She appears well-developed and well-nourished. No distress.  HENT:  Head: Normocephalic and atraumatic.  Right Ear: Hearing normal.  Left Ear: Hearing normal.  Nose: Nose normal.  Eyes: Conjunctivae are normal.  Right upper eyelid with erythema and swelling. Small pustule at rim of eyelid, consistent with external hordeolum. Mild ttp. No  drainage or scleral redness.  Pulmonary/Chest: Effort normal. No respiratory distress.  Musculoskeletal: Normal range of motion.  Neurological: She is alert and oriented to person, place, and time.  Skin: Skin is warm, dry and intact.  Psychiatric: She has a normal mood and affect. Her speech is normal and behavior is normal. Thought content normal.   BP 110/64 mmHg  Pulse 84  Temp(Src) 98.3 F (36.8 C) (Oral)  Resp 14  Ht 5\' 3"  (1.6 m)  Wt 138 lb (62.596 kg)  BMI 24.45 kg/m2  SpO2 94%   Visual Acuity Screening   Right eye Left eye Both eyes  Without correction: 20/40 20/40 20/40   With correction:       Assessment and  Plan:  1. Stye external, right Exam consistent with stye. Counseled on supportive care. Return in 1 week if symptoms not improving.   Benjaman Pott Drenda Freeze, MHS [facility]  05/19/2015

## 2015-07-08 ENCOUNTER — Telehealth: Payer: Self-pay | Admitting: Family Medicine

## 2015-07-08 MED ORDER — AMPHETAMINE-DEXTROAMPHETAMINE 20 MG PO TABS: 20.0000 mg | ORAL_TABLET | Freq: Two times a day (BID) | ORAL | Status: DC

## 2015-07-08 NOTE — Telephone Encounter (Signed)
Rx ready for pick up and patient is aware 

## 2015-07-08 NOTE — Telephone Encounter (Signed)
Pt request refill of the following: amphetamine-dextroamphetamine (ADDERALL) 20 MG tablet ° ° °Phamacy: ° °

## 2015-10-11 ENCOUNTER — Telehealth: Payer: Self-pay | Admitting: Family Medicine

## 2015-10-11 DIAGNOSIS — E039 Hypothyroidism, unspecified: Secondary | ICD-10-CM | POA: Diagnosis not present

## 2015-10-11 NOTE — Telephone Encounter (Signed)
Pt request refill amphetamine-dextroamphetamine (ADDERALL XR) 20 MG 24 hr capsule °3 mo supply °

## 2015-10-14 MED ORDER — AMPHETAMINE-DEXTROAMPHETAMINE 20 MG PO TABS
20.0000 mg | ORAL_TABLET | Freq: Two times a day (BID) | ORAL | Status: DC
Start: 2015-10-14 — End: 2016-02-12

## 2015-10-14 MED ORDER — AMPHETAMINE-DEXTROAMPHETAMINE 20 MG PO TABS: 20.0000 mg | ORAL_TABLET | Freq: Two times a day (BID) | ORAL | Status: DC

## 2015-10-14 MED ORDER — AMPHETAMINE-DEXTROAMPHET ER 20 MG PO CP24: ORAL_CAPSULE | ORAL | Status: DC

## 2015-10-14 NOTE — Telephone Encounter (Signed)
rx ready for pick up and patient is aware  

## 2015-10-16 DIAGNOSIS — F411 Generalized anxiety disorder: Secondary | ICD-10-CM | POA: Diagnosis not present

## 2015-10-16 DIAGNOSIS — F319 Bipolar disorder, unspecified: Secondary | ICD-10-CM | POA: Diagnosis not present

## 2015-11-13 DIAGNOSIS — F319 Bipolar disorder, unspecified: Secondary | ICD-10-CM | POA: Diagnosis not present

## 2015-12-12 DIAGNOSIS — F319 Bipolar disorder, unspecified: Secondary | ICD-10-CM | POA: Diagnosis not present

## 2016-01-20 DIAGNOSIS — Q72812 Congenital shortening of left lower limb: Secondary | ICD-10-CM | POA: Diagnosis not present

## 2016-01-20 DIAGNOSIS — M9905 Segmental and somatic dysfunction of pelvic region: Secondary | ICD-10-CM | POA: Diagnosis not present

## 2016-01-20 DIAGNOSIS — M9901 Segmental and somatic dysfunction of cervical region: Secondary | ICD-10-CM | POA: Diagnosis not present

## 2016-01-20 DIAGNOSIS — M50322 Other cervical disc degeneration at C5-C6 level: Secondary | ICD-10-CM | POA: Diagnosis not present

## 2016-01-21 DIAGNOSIS — Q72812 Congenital shortening of left lower limb: Secondary | ICD-10-CM | POA: Diagnosis not present

## 2016-01-21 DIAGNOSIS — M9905 Segmental and somatic dysfunction of pelvic region: Secondary | ICD-10-CM | POA: Diagnosis not present

## 2016-01-21 DIAGNOSIS — M50322 Other cervical disc degeneration at C5-C6 level: Secondary | ICD-10-CM | POA: Diagnosis not present

## 2016-01-21 DIAGNOSIS — M9901 Segmental and somatic dysfunction of cervical region: Secondary | ICD-10-CM | POA: Diagnosis not present

## 2016-01-22 DIAGNOSIS — Q72812 Congenital shortening of left lower limb: Secondary | ICD-10-CM | POA: Diagnosis not present

## 2016-01-22 DIAGNOSIS — M50322 Other cervical disc degeneration at C5-C6 level: Secondary | ICD-10-CM | POA: Diagnosis not present

## 2016-01-22 DIAGNOSIS — M9901 Segmental and somatic dysfunction of cervical region: Secondary | ICD-10-CM | POA: Diagnosis not present

## 2016-01-22 DIAGNOSIS — M9905 Segmental and somatic dysfunction of pelvic region: Secondary | ICD-10-CM | POA: Diagnosis not present

## 2016-01-27 DIAGNOSIS — Q72812 Congenital shortening of left lower limb: Secondary | ICD-10-CM | POA: Diagnosis not present

## 2016-01-27 DIAGNOSIS — M9901 Segmental and somatic dysfunction of cervical region: Secondary | ICD-10-CM | POA: Diagnosis not present

## 2016-01-27 DIAGNOSIS — M50322 Other cervical disc degeneration at C5-C6 level: Secondary | ICD-10-CM | POA: Diagnosis not present

## 2016-01-27 DIAGNOSIS — M9905 Segmental and somatic dysfunction of pelvic region: Secondary | ICD-10-CM | POA: Diagnosis not present

## 2016-01-28 DIAGNOSIS — M50322 Other cervical disc degeneration at C5-C6 level: Secondary | ICD-10-CM | POA: Diagnosis not present

## 2016-01-28 DIAGNOSIS — M9905 Segmental and somatic dysfunction of pelvic region: Secondary | ICD-10-CM | POA: Diagnosis not present

## 2016-01-28 DIAGNOSIS — Q72812 Congenital shortening of left lower limb: Secondary | ICD-10-CM | POA: Diagnosis not present

## 2016-01-28 DIAGNOSIS — M9901 Segmental and somatic dysfunction of cervical region: Secondary | ICD-10-CM | POA: Diagnosis not present

## 2016-01-29 DIAGNOSIS — M9905 Segmental and somatic dysfunction of pelvic region: Secondary | ICD-10-CM | POA: Diagnosis not present

## 2016-01-29 DIAGNOSIS — M50322 Other cervical disc degeneration at C5-C6 level: Secondary | ICD-10-CM | POA: Diagnosis not present

## 2016-01-29 DIAGNOSIS — M9901 Segmental and somatic dysfunction of cervical region: Secondary | ICD-10-CM | POA: Diagnosis not present

## 2016-01-29 DIAGNOSIS — Q72812 Congenital shortening of left lower limb: Secondary | ICD-10-CM | POA: Diagnosis not present

## 2016-02-03 DIAGNOSIS — M9905 Segmental and somatic dysfunction of pelvic region: Secondary | ICD-10-CM | POA: Diagnosis not present

## 2016-02-03 DIAGNOSIS — M50322 Other cervical disc degeneration at C5-C6 level: Secondary | ICD-10-CM | POA: Diagnosis not present

## 2016-02-03 DIAGNOSIS — M9901 Segmental and somatic dysfunction of cervical region: Secondary | ICD-10-CM | POA: Diagnosis not present

## 2016-02-03 DIAGNOSIS — Q72812 Congenital shortening of left lower limb: Secondary | ICD-10-CM | POA: Diagnosis not present

## 2016-02-04 DIAGNOSIS — Q72812 Congenital shortening of left lower limb: Secondary | ICD-10-CM | POA: Diagnosis not present

## 2016-02-04 DIAGNOSIS — M9905 Segmental and somatic dysfunction of pelvic region: Secondary | ICD-10-CM | POA: Diagnosis not present

## 2016-02-04 DIAGNOSIS — M50322 Other cervical disc degeneration at C5-C6 level: Secondary | ICD-10-CM | POA: Diagnosis not present

## 2016-02-04 DIAGNOSIS — M9901 Segmental and somatic dysfunction of cervical region: Secondary | ICD-10-CM | POA: Diagnosis not present

## 2016-02-06 DIAGNOSIS — M50322 Other cervical disc degeneration at C5-C6 level: Secondary | ICD-10-CM | POA: Diagnosis not present

## 2016-02-06 DIAGNOSIS — M9901 Segmental and somatic dysfunction of cervical region: Secondary | ICD-10-CM | POA: Diagnosis not present

## 2016-02-06 DIAGNOSIS — Q72812 Congenital shortening of left lower limb: Secondary | ICD-10-CM | POA: Diagnosis not present

## 2016-02-06 DIAGNOSIS — M9905 Segmental and somatic dysfunction of pelvic region: Secondary | ICD-10-CM | POA: Diagnosis not present

## 2016-02-07 ENCOUNTER — Telehealth: Payer: Self-pay | Admitting: Family Medicine

## 2016-02-07 NOTE — Telephone Encounter (Signed)
Pt need new Rx for Adderall °

## 2016-02-10 NOTE — Telephone Encounter (Signed)
Pt scheduled with Dr. Elease Hashimoto 8/23

## 2016-02-10 NOTE — Telephone Encounter (Signed)
Per Dr. Sherren Mocha protocol pt needed appointment for further refills.

## 2016-02-12 ENCOUNTER — Ambulatory Visit (INDEPENDENT_AMBULATORY_CARE_PROVIDER_SITE_OTHER): Payer: BLUE CROSS/BLUE SHIELD | Admitting: Family Medicine

## 2016-02-12 VITALS — BP 104/80 | HR 101 | Temp 99.0°F | Ht 63.0 in | Wt 147.7 lb

## 2016-02-12 DIAGNOSIS — J309 Allergic rhinitis, unspecified: Secondary | ICD-10-CM | POA: Diagnosis not present

## 2016-02-12 DIAGNOSIS — F909 Attention-deficit hyperactivity disorder, unspecified type: Secondary | ICD-10-CM | POA: Diagnosis not present

## 2016-02-12 DIAGNOSIS — M9905 Segmental and somatic dysfunction of pelvic region: Secondary | ICD-10-CM | POA: Diagnosis not present

## 2016-02-12 DIAGNOSIS — F988 Other specified behavioral and emotional disorders with onset usually occurring in childhood and adolescence: Secondary | ICD-10-CM

## 2016-02-12 DIAGNOSIS — Q72812 Congenital shortening of left lower limb: Secondary | ICD-10-CM | POA: Diagnosis not present

## 2016-02-12 DIAGNOSIS — M9901 Segmental and somatic dysfunction of cervical region: Secondary | ICD-10-CM | POA: Diagnosis not present

## 2016-02-12 DIAGNOSIS — M50322 Other cervical disc degeneration at C5-C6 level: Secondary | ICD-10-CM | POA: Diagnosis not present

## 2016-02-12 MED ORDER — AZELASTINE HCL 0.1 % NA SOLN: 2.0000 | Freq: Two times a day (BID) | NASAL | 12 refills | Status: DC

## 2016-02-12 MED ORDER — AMPHETAMINE-DEXTROAMPHETAMINE 20 MG PO TABS: 20.0000 mg | ORAL_TABLET | Freq: Two times a day (BID) | ORAL | 0 refills | Status: DC

## 2016-02-12 NOTE — Progress Notes (Signed)
Subjective:     Patient ID: Sarah Hall, female   DOB: 03/16/1961, 55 y.o.   MRN: [id number]  HPI Patient seen for the following issues:  Attention deficit disorder. She's on Adderall 20 mg twice daily and has been on this for several years. Requesting refills. She's had some weight gain during the past year. She is followed by psychiatry for bipolar disorder and she states that has been very stable. No insomnia. No chest pains  Perennial and seasonal allergies. Currently takes Zyrtec and Flonase and still has frequent breakthrough symptoms.  Past Medical History:  Diagnosis Date  . Ankle sprain    right ankle  . Deep breathing    hard to take a deep breath due to thyroid issue  . Depression    Past Surgical History:  Procedure Laterality Date  . DILATION AND EVACUATION      2 times  . scraping of uterus     20 years ago  . WISDOM TOOTH EXTRACTION      reports that she has never smoked. She has never used smokeless tobacco. She reports that she drinks about 6.0 oz of alcohol per week . She reports that she does not use drugs. family history includes Alzheimer's disease in her father. Allergies  Allergen Reactions  . Codeine Phosphate     Can take hydrocodone/Codeine cause anxiousness     Review of Systems  Constitutional: Negative for chills, fatigue and fever.  HENT: Positive for congestion.   Eyes: Negative for visual disturbance.  Respiratory: Negative for cough, chest tightness, shortness of breath and wheezing.   Cardiovascular: Negative for chest pain, palpitations and leg swelling.  Neurological: Negative for dizziness, seizures, syncope, weakness, light-headedness and headaches.       Objective:   Physical Exam  Constitutional: She appears well-developed and well-nourished.  Eyes: Pupils are equal, round, and reactive to light.  Neck: Neck supple. No JVD present. No thyromegaly present.  Cardiovascular: Normal rate and regular rhythm.  Exam  reveals no gallop.   Pulmonary/Chest: Effort normal and breath sounds normal. No respiratory distress. She has no wheezes. She has no rales.  Musculoskeletal: She exhibits no edema.  Neurological: She is alert.       Assessment:     #1 attention deficit disorder stable on Adderall  #2 seasonal and perennial allergic rhinitis    Plan:     -Refill Adderall for 3 months until follow-up with her primary -Try Astelin nasal 1-2 sprays per nostril twice daily as needed  Eulas Post MD [facility]

## 2016-02-12 NOTE — Progress Notes (Signed)
Pre visit review using our clinic review tool, if applicable. No additional management support is needed unless otherwise documented below in the visit note. 

## 2016-02-13 DIAGNOSIS — Q72812 Congenital shortening of left lower limb: Secondary | ICD-10-CM | POA: Diagnosis not present

## 2016-02-13 DIAGNOSIS — M50322 Other cervical disc degeneration at C5-C6 level: Secondary | ICD-10-CM | POA: Diagnosis not present

## 2016-02-13 DIAGNOSIS — M9901 Segmental and somatic dysfunction of cervical region: Secondary | ICD-10-CM | POA: Diagnosis not present

## 2016-02-13 DIAGNOSIS — M9905 Segmental and somatic dysfunction of pelvic region: Secondary | ICD-10-CM | POA: Diagnosis not present

## 2016-02-13 NOTE — Telephone Encounter (Signed)
Called and spoke to pt, informed her that per Dr. Sherren Mocha establishing with a new provider will be best. Pt explained that she would love to transfer to Dr. Sarajane Jews. I explained that Dr. Sarajane Jews is not accepting new pt's at this time. I informed her that Nadara Mode, and Dr,Bruinsma are accepting new pt's at this time. Pt stated that she will come in for acute visits and see what provider she would like to establish with at that time.

## 2016-02-17 DIAGNOSIS — Q72812 Congenital shortening of left lower limb: Secondary | ICD-10-CM | POA: Diagnosis not present

## 2016-02-17 DIAGNOSIS — M9901 Segmental and somatic dysfunction of cervical region: Secondary | ICD-10-CM | POA: Diagnosis not present

## 2016-02-17 DIAGNOSIS — M9905 Segmental and somatic dysfunction of pelvic region: Secondary | ICD-10-CM | POA: Diagnosis not present

## 2016-02-17 DIAGNOSIS — M50322 Other cervical disc degeneration at C5-C6 level: Secondary | ICD-10-CM | POA: Diagnosis not present

## 2016-02-20 DIAGNOSIS — M9901 Segmental and somatic dysfunction of cervical region: Secondary | ICD-10-CM | POA: Diagnosis not present

## 2016-02-20 DIAGNOSIS — M9905 Segmental and somatic dysfunction of pelvic region: Secondary | ICD-10-CM | POA: Diagnosis not present

## 2016-02-20 DIAGNOSIS — Q72812 Congenital shortening of left lower limb: Secondary | ICD-10-CM | POA: Diagnosis not present

## 2016-02-20 DIAGNOSIS — M50322 Other cervical disc degeneration at C5-C6 level: Secondary | ICD-10-CM | POA: Diagnosis not present

## 2016-02-25 DIAGNOSIS — M9905 Segmental and somatic dysfunction of pelvic region: Secondary | ICD-10-CM | POA: Diagnosis not present

## 2016-02-25 DIAGNOSIS — M9901 Segmental and somatic dysfunction of cervical region: Secondary | ICD-10-CM | POA: Diagnosis not present

## 2016-02-25 DIAGNOSIS — M50322 Other cervical disc degeneration at C5-C6 level: Secondary | ICD-10-CM | POA: Diagnosis not present

## 2016-02-25 DIAGNOSIS — Q72812 Congenital shortening of left lower limb: Secondary | ICD-10-CM | POA: Diagnosis not present

## 2016-02-27 DIAGNOSIS — M9905 Segmental and somatic dysfunction of pelvic region: Secondary | ICD-10-CM | POA: Diagnosis not present

## 2016-02-27 DIAGNOSIS — M9901 Segmental and somatic dysfunction of cervical region: Secondary | ICD-10-CM | POA: Diagnosis not present

## 2016-02-27 DIAGNOSIS — M50322 Other cervical disc degeneration at C5-C6 level: Secondary | ICD-10-CM | POA: Diagnosis not present

## 2016-02-27 DIAGNOSIS — Q72812 Congenital shortening of left lower limb: Secondary | ICD-10-CM | POA: Diagnosis not present

## 2016-03-02 DIAGNOSIS — M50322 Other cervical disc degeneration at C5-C6 level: Secondary | ICD-10-CM | POA: Diagnosis not present

## 2016-03-02 DIAGNOSIS — M9901 Segmental and somatic dysfunction of cervical region: Secondary | ICD-10-CM | POA: Diagnosis not present

## 2016-03-02 DIAGNOSIS — Q72812 Congenital shortening of left lower limb: Secondary | ICD-10-CM | POA: Diagnosis not present

## 2016-03-02 DIAGNOSIS — M9905 Segmental and somatic dysfunction of pelvic region: Secondary | ICD-10-CM | POA: Diagnosis not present

## 2016-03-16 DIAGNOSIS — Q72812 Congenital shortening of left lower limb: Secondary | ICD-10-CM | POA: Diagnosis not present

## 2016-03-16 DIAGNOSIS — M9905 Segmental and somatic dysfunction of pelvic region: Secondary | ICD-10-CM | POA: Diagnosis not present

## 2016-03-16 DIAGNOSIS — M50322 Other cervical disc degeneration at C5-C6 level: Secondary | ICD-10-CM | POA: Diagnosis not present

## 2016-03-16 DIAGNOSIS — M9901 Segmental and somatic dysfunction of cervical region: Secondary | ICD-10-CM | POA: Diagnosis not present

## 2016-03-24 DIAGNOSIS — F319 Bipolar disorder, unspecified: Secondary | ICD-10-CM | POA: Diagnosis not present

## 2016-05-22 ENCOUNTER — Telehealth: Payer: Self-pay | Admitting: Family Medicine

## 2016-05-22 NOTE — Telephone Encounter (Signed)
Pt needs new rx generic adderall 20 mg °

## 2016-05-25 ENCOUNTER — Other Ambulatory Visit: Payer: Self-pay | Admitting: Emergency Medicine

## 2016-05-25 MED ORDER — AMPHETAMINE-DEXTROAMPHETAMINE 20 MG PO TABS: 20.0000 mg | ORAL_TABLET | Freq: Two times a day (BID) | ORAL | 0 refills | Status: DC

## 2016-05-25 NOTE — Telephone Encounter (Signed)
Called and left message to return call to office. Patient's father is not DPR.   Please inform pt that prescription has been printed and upfront ready for pick up.

## 2016-05-25 NOTE — Telephone Encounter (Signed)
error/ltd

## 2016-05-25 NOTE — Telephone Encounter (Signed)
Called and left voicemail for pt to return call to office   Please inform pt that prescription has been printed and upfront ready for pick up.

## 2016-05-25 NOTE — Telephone Encounter (Signed)
ERROR  Please disregard previous message.

## 2016-05-25 NOTE — Telephone Encounter (Signed)
Prescription had been printed awaiting Dr. Sherren Mocha sig.

## 2016-05-28 DIAGNOSIS — Z01419 Encounter for gynecological examination (general) (routine) without abnormal findings: Secondary | ICD-10-CM | POA: Diagnosis not present

## 2016-05-28 DIAGNOSIS — Z1231 Encounter for screening mammogram for malignant neoplasm of breast: Secondary | ICD-10-CM | POA: Diagnosis not present

## 2016-05-28 DIAGNOSIS — Z6825 Body mass index (BMI) 25.0-25.9, adult: Secondary | ICD-10-CM | POA: Diagnosis not present

## 2016-08-27 ENCOUNTER — Telehealth: Payer: Self-pay | Admitting: Family Medicine

## 2016-08-27 NOTE — Telephone Encounter (Signed)
Pt need new Rx for Adderall  Pt is aware of 3 business days for refills.

## 2016-08-28 MED ORDER — AMPHETAMINE-DEXTROAMPHETAMINE 20 MG PO TABS: 20.0000 mg | ORAL_TABLET | Freq: Two times a day (BID) | ORAL | 0 refills | Status: DC

## 2016-08-28 NOTE — Telephone Encounter (Signed)
Dr. Elease Hashimoto seen pt for ADD on 02/16/16.  Will forward to Dr. B for authorization to fill for one month with Dr. Sherren Mocha is out of the office.

## 2016-08-28 NOTE — Telephone Encounter (Signed)
Placed at the front desk.  Left a message for the pt to pick up.

## 2016-08-28 NOTE — Telephone Encounter (Signed)
OK 

## 2016-08-28 NOTE — Telephone Encounter (Signed)
Printed for Dr. Elease Hashimoto to sign.

## 2016-09-07 ENCOUNTER — Ambulatory Visit: Payer: BLUE CROSS/BLUE SHIELD | Admitting: Family Medicine

## 2016-09-14 ENCOUNTER — Telehealth: Payer: Self-pay | Admitting: Family Medicine

## 2016-09-14 ENCOUNTER — Encounter: Payer: Self-pay | Admitting: Family Medicine

## 2016-09-14 ENCOUNTER — Ambulatory Visit (INDEPENDENT_AMBULATORY_CARE_PROVIDER_SITE_OTHER): Payer: BLUE CROSS/BLUE SHIELD | Admitting: Family Medicine

## 2016-09-14 VITALS — BP 144/94 | Temp 98.3°F | Ht 63.0 in | Wt 147.0 lb

## 2016-09-14 DIAGNOSIS — Z0184 Encounter for antibody response examination: Secondary | ICD-10-CM | POA: Diagnosis not present

## 2016-09-14 DIAGNOSIS — Z23 Encounter for immunization: Secondary | ICD-10-CM

## 2016-09-14 NOTE — Telephone Encounter (Signed)
Pt request refill of the following: amphetamine-dextroamphetamine (ADDERALL) 20 MG tablet ° ° °Phamacy: ° °

## 2016-09-14 NOTE — Progress Notes (Signed)
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  She needs an MMR titer, parasellar titer, and starting a hep B series 3. She also needs a PPD skin test.  Blood pressure came in was 144/90. Her blood pressures always been normal.

## 2016-09-14 NOTE — Progress Notes (Signed)
Pre visit review using our clinic review tool, if applicable. No additional management support is needed unless otherwise documented below in the visit note. 

## 2016-09-15 LAB — MEASLES/MUMPS/RUBELLA IMMUNITY
Mumps IgG: >300 [AU]/ml — ABNORMAL HIGH (ref <9.00)
Rubella: 1.3 {index} — ABNORMAL HIGH (ref <0.90)
Rubeola IgG: <25 [AU]/ml (ref <25.00)

## 2016-09-15 LAB — VARICELLA ZOSTER ANTIBODY, IGG: Varicella IgG: 2134 {index} — ABNORMAL HIGH (ref <135.00)

## 2016-09-15 MED ORDER — AMPHETAMINE-DEXTROAMPHETAMINE 20 MG PO TABS
20.0000 mg | ORAL_TABLET | Freq: Two times a day (BID) | ORAL | 0 refills | Status: DC
Start: 2016-09-15 — End: 2017-01-15

## 2016-09-15 MED ORDER — AMPHETAMINE-DEXTROAMPHETAMINE 20 MG PO TABS: 20.0000 mg | ORAL_TABLET | Freq: Two times a day (BID) | ORAL | 0 refills | Status: DC

## 2016-09-15 NOTE — Telephone Encounter (Signed)
Per Dr. Sherren Mocha, ok to fill.  Printed for him to sign.

## 2016-09-15 NOTE — Telephone Encounter (Signed)
Pt notified to pick up at the front desk. 

## 2016-09-15 NOTE — Telephone Encounter (Signed)
Dr. Elease Hashimoto filled for 1 month on 08/28/16.  Request is slightly early.   Ok to fill for 3 months?

## 2016-09-22 ENCOUNTER — Ambulatory Visit: Payer: BLUE CROSS/BLUE SHIELD

## 2016-09-29 ENCOUNTER — Ambulatory Visit (INDEPENDENT_AMBULATORY_CARE_PROVIDER_SITE_OTHER): Payer: BLUE CROSS/BLUE SHIELD

## 2016-09-29 DIAGNOSIS — Z111 Encounter for screening for respiratory tuberculosis: Secondary | ICD-10-CM | POA: Diagnosis not present

## 2016-09-29 DIAGNOSIS — Z23 Encounter for immunization: Secondary | ICD-10-CM

## 2016-09-29 NOTE — Progress Notes (Signed)
Patient here for MMR and TB skin test d/t  requirements for volunteer work with [employer]. Administered TB skin test to left FA ID; tolerated well; administered MMR to left arm SQ; patient tolerated well; no s/s of reactions; patient to return in 48-72 hours to have TB skin test read; patient was educated on immunization and skin test including possible side effects and reactions; patient verbalized understanding; patient to return in 4 weeks for second MMR immunization and Hep B.

## 2016-10-01 ENCOUNTER — Telehealth: Payer: Self-pay | Admitting: *Deleted

## 2016-10-01 ENCOUNTER — Other Ambulatory Visit: Payer: Self-pay | Admitting: *Deleted

## 2016-10-01 DIAGNOSIS — Z111 Encounter for screening for respiratory tuberculosis: Secondary | ICD-10-CM

## 2016-10-01 LAB — TB SKIN TEST
Induration: 0 mm
TB Skin Test: NEGATIVE

## 2016-10-01 NOTE — Telephone Encounter (Signed)
PPD Reading Note  PPD read and results entered in EpicCare.  Result:  0 mm induration.  Interpretation: negative  If test not read within 48-72 hours of initial placement, patient advised to repeat in other arm 1-3 weeks after this test.  Allergic reaction: No

## 2016-10-05 ENCOUNTER — Other Ambulatory Visit: Payer: Self-pay | Admitting: Family Medicine

## 2016-10-05 DIAGNOSIS — Z1231 Encounter for screening mammogram for malignant neoplasm of breast: Secondary | ICD-10-CM

## 2016-10-08 ENCOUNTER — Ambulatory Visit: Payer: BLUE CROSS/BLUE SHIELD

## 2016-10-23 ENCOUNTER — Ambulatory Visit: Payer: BLUE CROSS/BLUE SHIELD

## 2016-10-27 ENCOUNTER — Ambulatory Visit (INDEPENDENT_AMBULATORY_CARE_PROVIDER_SITE_OTHER): Payer: BLUE CROSS/BLUE SHIELD | Admitting: Emergency Medicine

## 2016-10-27 DIAGNOSIS — Z23 Encounter for immunization: Secondary | ICD-10-CM

## 2016-10-28 DIAGNOSIS — F319 Bipolar disorder, unspecified: Secondary | ICD-10-CM | POA: Diagnosis not present

## 2016-11-10 ENCOUNTER — Ambulatory Visit
Admission: RE | Admit: 2016-11-10 | Discharge: 2016-11-10 | Disposition: A | Discharge status: Home / Self Care | Payer: BLUE CROSS/BLUE SHIELD | Source: Ambulatory Visit | Attending: Family Medicine | Admitting: Family Medicine

## 2016-11-10 DIAGNOSIS — Z1231 Encounter for screening mammogram for malignant neoplasm of breast: Secondary | ICD-10-CM

## 2017-01-13 ENCOUNTER — Telehealth: Payer: Self-pay | Admitting: Family Medicine

## 2017-01-13 NOTE — Telephone Encounter (Signed)
Pt request refill amphetamine-dextroamphetamine (ADDERALL) 20 MG tablet °3 mo supply °

## 2017-01-15 ENCOUNTER — Other Ambulatory Visit: Payer: Self-pay

## 2017-01-15 MED ORDER — AMPHETAMINE-DEXTROAMPHETAMINE 20 MG PO TABS: 20.0000 mg | ORAL_TABLET | Freq: Two times a day (BID) | ORAL | 0 refills | Status: DC

## 2017-01-15 NOTE — Telephone Encounter (Signed)
Rx has been printed awaiting signing. 

## 2017-01-15 NOTE — Telephone Encounter (Signed)
Rx is signed and patient is aware to pick up the Rx in the office.

## 2017-01-15 NOTE — Telephone Encounter (Signed)
Okay for refill?  

## 2017-02-23 DIAGNOSIS — F319 Bipolar disorder, unspecified: Secondary | ICD-10-CM | POA: Diagnosis not present

## 2017-03-12 ENCOUNTER — Encounter: Payer: Self-pay | Admitting: Family Medicine

## 2017-03-23 DIAGNOSIS — F319 Bipolar disorder, unspecified: Secondary | ICD-10-CM | POA: Diagnosis not present

## 2017-03-29 ENCOUNTER — Ambulatory Visit (INDEPENDENT_AMBULATORY_CARE_PROVIDER_SITE_OTHER): Payer: BLUE CROSS/BLUE SHIELD | Admitting: *Deleted

## 2017-03-29 DIAGNOSIS — Z23 Encounter for immunization: Secondary | ICD-10-CM | POA: Diagnosis not present

## 2017-03-30 ENCOUNTER — Telehealth: Payer: Self-pay | Admitting: Family Medicine

## 2017-03-30 MED ORDER — AMPHETAMINE-DEXTROAMPHETAMINE 20 MG PO TABS: 20.0000 mg | ORAL_TABLET | Freq: Two times a day (BID) | ORAL | 0 refills | Status: DC

## 2017-03-30 NOTE — Telephone Encounter (Signed)
Okay per Dr. Sherren Mocha, Rxs printed and left voicemail letting pt know Rxs ready for pick up

## 2017-03-30 NOTE — Telephone Encounter (Signed)
3 months worth of Rxs were given in on 01/15/17, not due until 04/16/17, will check with Dr. Sherren Mocha about refilling it since it's early

## 2017-03-30 NOTE — Telephone Encounter (Signed)
Pt request refill of the following: amphetamine-dextroamphetamine (ADDERALL) 20 MG tablet ° ° °Phamacy: ° °

## 2017-05-19 DIAGNOSIS — F319 Bipolar disorder, unspecified: Secondary | ICD-10-CM | POA: Diagnosis not present

## 2017-05-20 DIAGNOSIS — F319 Bipolar disorder, unspecified: Secondary | ICD-10-CM | POA: Diagnosis not present

## 2017-07-02 ENCOUNTER — Telehealth: Payer: Self-pay | Admitting: Family Medicine

## 2017-07-02 NOTE — Telephone Encounter (Signed)
Copied from [place] [id number]. Topic: Quick Communication - Rx Refill/Question >> Jul 02, 2017  2:24 PM Carolyn Stare wrote: Medication  amphetamine-dextroamphetamine (ADDERALL) 20 MG tablet   PICK UP     [phone]

## 2017-07-05 ENCOUNTER — Other Ambulatory Visit: Payer: Self-pay

## 2017-07-05 MED ORDER — AMPHETAMINE-DEXTROAMPHETAMINE 20 MG PO TABS: 20.0000 mg | ORAL_TABLET | Freq: Two times a day (BID) | ORAL | 0 refills | Status: DC

## 2017-07-05 NOTE — Telephone Encounter (Signed)
Called pt left a message for pt to return my call in the office regarding to her medication refill request.

## 2017-07-05 NOTE — Telephone Encounter (Signed)
Rx for Adderall has been printed and is waiting for signature from dr Sherren Mocha.

## 2017-07-05 NOTE — Telephone Encounter (Signed)
Rx is ready for pick up at the front desk.

## 2017-07-08 DIAGNOSIS — Z1231 Encounter for screening mammogram for malignant neoplasm of breast: Secondary | ICD-10-CM | POA: Diagnosis not present

## 2017-07-08 DIAGNOSIS — Z01419 Encounter for gynecological examination (general) (routine) without abnormal findings: Secondary | ICD-10-CM | POA: Diagnosis not present

## 2017-07-08 DIAGNOSIS — Z6825 Body mass index (BMI) 25.0-25.9, adult: Secondary | ICD-10-CM | POA: Diagnosis not present

## 2017-08-16 DIAGNOSIS — E039 Hypothyroidism, unspecified: Secondary | ICD-10-CM | POA: Diagnosis not present

## 2017-08-18 DIAGNOSIS — F319 Bipolar disorder, unspecified: Secondary | ICD-10-CM | POA: Diagnosis not present

## 2017-09-16 DIAGNOSIS — F319 Bipolar disorder, unspecified: Secondary | ICD-10-CM | POA: Diagnosis not present

## 2017-09-27 ENCOUNTER — Other Ambulatory Visit: Payer: Self-pay | Admitting: Family Medicine

## 2017-10-06 ENCOUNTER — Telehealth: Payer: Self-pay | Admitting: Family Medicine

## 2017-10-06 NOTE — Telephone Encounter (Signed)
Adderall refill Last OV: 02/12/16 Last Refill:07/05/17 ("fill in 2 months" [place] PCP: Dr Sherren Mocha

## 2017-10-06 NOTE — Telephone Encounter (Unsigned)
Copied from [place] [phone]. Topic: Quick Communication - Rx Refill/Question >> Oct 06, 2017  2:28 PM Percell Belt A wrote: Medication: amphetamine-dextroamphetamine (ADDERALL) 20 MG tablet [[id number]] Has the patient contacted their pharmacy? No  (Agent: If no, request that the patient contact the pharmacy for the refill.) Preferred Pharmacy (with phone number or street name): [facility] on [address] Agent: Please be advised that RX refills may take up to 3 business days. We ask that you follow-up with your pharmacy.

## 2017-10-07 NOTE — Telephone Encounter (Signed)
Spoke with pt regarding her Rx request for Adderall refill,pt states that she has enough to last her until the office opens back next week.

## 2017-10-13 NOTE — Telephone Encounter (Signed)
Pt is requesting refills, please advise.

## 2017-10-19 ENCOUNTER — Other Ambulatory Visit: Payer: Self-pay | Admitting: Family Medicine

## 2017-10-19 MED ORDER — AMPHETAMINE-DEXTROAMPHETAMINE 20 MG PO TABS: 20.0000 mg | ORAL_TABLET | Freq: Two times a day (BID) | ORAL | 0 refills | Status: DC

## 2017-10-19 NOTE — Telephone Encounter (Signed)
Spoke with pt voiced understanding that Rx for Adderall was sent to pharmacy

## 2017-10-19 NOTE — Telephone Encounter (Signed)
[name] please call Kathryn Martinique and tell her we emailed in her prescriptions

## 2017-12-14 ENCOUNTER — Other Ambulatory Visit: Payer: Self-pay | Admitting: Family Medicine

## 2017-12-14 NOTE — Telephone Encounter (Signed)
Copied from [place] [id number]. Topic: Quick Communication - Rx Refill/Question >> Dec 14, 2017 11:42 AM Synthia Innocent wrote: Medication: amphetamine-dextroamphetamine (ADDERALL) 20 MG tablet  Has the patient contacted their pharmacy? Yes.   (Agent: If no, request that the patient contact the pharmacy for the refill.) (Agent: If yes, when and what did the pharmacy advise?)  Preferred Pharmacy (with phone number or street name): [facility] [address]: Please be advised that RX refills may take up to 3 business days. We ask that you follow-up with your pharmacy.

## 2017-12-15 NOTE — Telephone Encounter (Signed)
Refill of Adderall  LOV 09/14/16   Dr. Sherren Mocha    [facility] 10/19/17  #60  0 refills  [id number]  Sarah Hall, [place]

## 2017-12-16 NOTE — Telephone Encounter (Signed)
Please advise, dr Sherren Mocha pt

## 2017-12-20 MED ORDER — AMPHETAMINE-DEXTROAMPHETAMINE 20 MG PO TABS: 20.0000 mg | ORAL_TABLET | Freq: Two times a day (BID) | ORAL | 0 refills | Status: DC

## 2018-03-01 ENCOUNTER — Other Ambulatory Visit: Payer: Self-pay | Admitting: Family Medicine

## 2018-03-01 NOTE — Telephone Encounter (Signed)
Copied from [place] [id number]. Topic: Quick Communication - Rx Refill/Question >> Mar 01, 2018  4:41 PM Reyne Dumas L wrote: Medication: amphetamine-dextroamphetamine (ADDERALL) 20 MG tablet  Has the patient contacted their pharmacy? Yes - controlled substance.  Pt wants to know if she can get a 90 day supply instead of a 30 day supply (Agent: If no, request that the patient contact the pharmacy for the refill.) (Agent: If yes, when and what did the pharmacy advise?)  Preferred Pharmacy (with phone number or street name): [facility] [place], [place] AT [facility] [address] [place] [phone] (Phone) [phone] (Fax)  Agent: Please be advised that RX refills may take up to 3 business days. We ask that you follow-up with your pharmacy.

## 2018-03-01 NOTE — Telephone Encounter (Signed)
Refill of adderall  LOV 09/14/16 Dr. Sherren Mocha  [facility] 12/20/17  #60  0  Refills   [id number] - [place], [place] - [address] AT [facility] [address]        [phone] (Phone) [phone] (Fax)

## 2018-03-02 ENCOUNTER — Other Ambulatory Visit: Payer: Self-pay | Admitting: Family Medicine

## 2018-03-02 MED ORDER — AMPHETAMINE-DEXTROAMPHETAMINE 20 MG PO TABS: 20.0000 mg | ORAL_TABLET | Freq: Two times a day (BID) | ORAL | 0 refills | Status: DC

## 2018-03-02 NOTE — Telephone Encounter (Signed)
Dr. Sherren Mocha, please advise if refill is ok to do.  Thanks

## 2018-05-11 DIAGNOSIS — F319 Bipolar disorder, unspecified: Secondary | ICD-10-CM | POA: Diagnosis not present

## 2018-05-16 DIAGNOSIS — F319 Bipolar disorder, unspecified: Secondary | ICD-10-CM | POA: Diagnosis not present

## 2018-05-28 DIAGNOSIS — J040 Acute laryngitis: Secondary | ICD-10-CM | POA: Diagnosis not present

## 2018-05-31 DIAGNOSIS — F319 Bipolar disorder, unspecified: Secondary | ICD-10-CM | POA: Diagnosis not present

## 2018-06-05 DIAGNOSIS — J029 Acute pharyngitis, unspecified: Secondary | ICD-10-CM | POA: Diagnosis not present

## 2018-06-09 ENCOUNTER — Ambulatory Visit: Payer: BLUE CROSS/BLUE SHIELD | Admitting: Family Medicine

## 2018-06-09 ENCOUNTER — Encounter: Payer: Self-pay | Admitting: Family Medicine

## 2018-06-09 VITALS — BP 140/100 | HR 87 | Temp 97.6°F | Ht 63.0 in | Wt 131.2 lb

## 2018-06-09 DIAGNOSIS — J302 Other seasonal allergic rhinitis: Secondary | ICD-10-CM | POA: Insufficient documentation

## 2018-06-09 DIAGNOSIS — F339 Major depressive disorder, recurrent, unspecified: Secondary | ICD-10-CM | POA: Diagnosis not present

## 2018-06-09 DIAGNOSIS — E039 Hypothyroidism, unspecified: Secondary | ICD-10-CM | POA: Insufficient documentation

## 2018-06-09 DIAGNOSIS — F900 Attention-deficit hyperactivity disorder, predominantly inattentive type: Secondary | ICD-10-CM

## 2018-06-09 DIAGNOSIS — I10 Essential (primary) hypertension: Secondary | ICD-10-CM

## 2018-06-09 MED ORDER — AMPHETAMINE-DEXTROAMPHETAMINE 20 MG PO TABS: 20.0000 mg | ORAL_TABLET | Freq: Two times a day (BID) | ORAL | 0 refills | Status: DC

## 2018-06-09 NOTE — Progress Notes (Signed)
Subjective:    Patient ID: Sarah Hall, female    DOB: 11-25-1960, 57 y.o.   MRN: [id number]  Chief Complaint  Patient presents with  . Transitions Of Care    HPI Patient was seen today for f/u on chronic conditions and TOC, previously seen by Dr. Sherren Mocha. Pt was last seen in 2018.  ADD: -on Adderall 20 mg BID  -on meds x 6 yrs -previously seen by psychiatrist.  -now followed by Psych NP who will not rx her adderall. -pt notes med maker her calmer  Depression: -had dealt with depression "since as long as I can remember" -seeing [name], NP at [facility] and therapy? -currently off meds x 6 months -Wellbutrin didn't work -Rexulti caused 25 lb wt gain  ongoing illness: -"sick x 1 mo" -started with pressure behind eyes and sore throat -now with stuffy nose -seen by UC, told viral.  Given 6 d steroid course -developed thrush -pt's step daughter was sick -has h/o allergies, has not been taking anything. -denies HA, facial pain/pressure, ear pain/pressure, cough, fever  Hypothyroidism: -dx'd by OB/GYN -Taking Synthroid 88 mcg daily -Has been following with OB/GYN.  Past Medical History:  Diagnosis Date  . Ankle sprain    right ankle  . Deep breathing    hard to take a deep breath due to thyroid issue  . Depression     Allergies  Allergen Reactions  . Codeine Phosphate     Can not take hydrocodone/Codeine cause anxiousness    ROS General: Denies fever, chills, night sweats, changes in weight, changes in appetite HEENT: Denies headaches, ear pain, changes in vision, rhinorrhea, sore throat  + nasal congestion CV: Denies CP, palpitations, SOB, orthopnea Pulm: Denies SOB, cough, wheezing GI: Denies abdominal pain, nausea, vomiting, diarrhea, constipation GU: Denies dysuria, hematuria, frequency, vaginal discharge Msk: Denies muscle cramps, joint pains Neuro: Denies weakness, numbness, tingling Skin: Denies rashes, bruising Psych: Denies  depression, anxiety, hallucinations  +ADD, depression    Objective:    Blood pressure (!) 140/100, pulse 87, temperature 97.6 F (36.4 C), temperature source Oral, height 5\' 3"  (1.6 m), weight 131 lb 3.2 oz (59.5 kg), SpO2 99 %.  Gen. Pleasant, well-nourished, in no distress, normal affect  HEENT: [place]/AT, face symmetric, conjunctiva clear, no scleral icterus, PERRLA, EOMI, nares patent without drainage, pharynx without erythema or exudate. TMs full b/l. No cervical lymphadenopathy.  No thyromegaly. Lungs: no accessory muscle use, CTAB, no wheezes or rales Cardiovascular: RRR, no peripheral edema Neuro:  A&Ox3, CN II-XII intact, normal gait  Wt Readings from Last 3 Encounters:  06/09/18 131 lb 3.2 oz (59.5 kg)  09/14/16 147 lb (66.7 kg)  02/12/16 147 lb 11.2 oz (67 kg)    Lab Results  Component Value Date   WBC 6.6 03/06/2015   HGB 12.8 03/06/2015   HCT 38.3 03/06/2015   PLT 190.0 03/06/2015   GLUCOSE 104 (H) 03/06/2015   CHOL 169 03/06/2015   TRIG 75.0 03/06/2015   HDL 82.80 03/06/2015   LDLCALC 71 03/06/2015   ALT 9 03/06/2015   AST 15 03/06/2015   NA 143 03/06/2015   K 3.9 03/06/2015   CL 107 03/06/2015   CREATININE 0.73 03/06/2015   BUN 14 03/06/2015   CO2 28 03/06/2015   TSH 3.42 03/06/2015    Assessment/Plan:  Attention deficit hyperactivity disorder (ADHD), predominantly inattentive type  -recommend formal testing for ADHD -unclear why BH will not manage this for pt.   -Also recommend pt see  Psychiatrist who can can manage all BH meds. -Given elevated BP will send in 1 month refill for Adderall.  Pt to follow-up in clinic to recheck BP.  Will likely reduce dose of Adderall with efforts to get patient off medication completely. - Plan: amphetamine-dextroamphetamine (ADDERALL) 20 MG tablet,  UDS  Depression, recurrent ([place]) -currently stable off meds. -continue f/u with [facility] -pt should consider seeing a psychiatrist to manage all [place] meds.  Currently followed by  [name], NP  Seasonal allergies -take OTC zyrtec  Acquired hypothyroidism  Essential hypertension -elevated this visit and at last visit 1.5 yrs ago -not on meds -pt to check bp at home and keep a log -Lifestyle modifications encouraged -Adderall likely contributing to elevated BP.  Will send in 1 month's refill on Adderall however will likely reduce dose at next office visit. -Patient is to follow-up in 2 weeks for blood pressure.  Hypothyroidism -Continue levothyroxine 88 mcg daily  Follow-up in 2 weeks for BP  Grier Mitts, MD

## 2018-06-09 NOTE — Patient Instructions (Signed)

## 2018-06-12 LAB — PAIN MGMT, PROFILE 8 W/CONF, U
6 Acetylmorphine: NEGATIVE ng/mL (ref <10)
Alcohol Metabolites: POSITIVE ng/mL — AB (ref <500)
Amphetamine: 1978 ng/mL — ABNORMAL HIGH (ref <250)
Amphetamines: POSITIVE ng/mL — AB (ref <500)
Benzodiazepines: NEGATIVE ng/mL (ref <100)
Buprenorphine, Urine: NEGATIVE ng/mL (ref <5)
Cocaine Metabolite: NEGATIVE ng/mL (ref <150)
Creatinine: 179.8 mg/dL
Ethyl Glucuronide (ETG): 2393 ng/mL — ABNORMAL HIGH (ref <500)
Ethyl Sulfate (ETS): 813 ng/mL — ABNORMAL HIGH (ref <100)
MDMA: NEGATIVE ng/mL (ref <500)
Marijuana Metabolite: NEGATIVE ng/mL (ref <20)
Methamphetamine: NEGATIVE ng/mL (ref <250)
Opiates: NEGATIVE ng/mL (ref <100)
Oxidant: NEGATIVE ug/mL (ref <200)
Oxycodone: NEGATIVE ng/mL (ref <100)
pH: 7.13 (ref 4.5–9.0)

## 2018-06-21 ENCOUNTER — Ambulatory Visit: Payer: BLUE CROSS/BLUE SHIELD | Admitting: Family Medicine

## 2018-06-21 ENCOUNTER — Telehealth: Payer: Self-pay | Admitting: Family Medicine

## 2018-06-21 ENCOUNTER — Encounter: Payer: Self-pay | Admitting: Family Medicine

## 2018-06-21 VITALS — BP 142/86 | HR 95 | Temp 98.2°F | Wt 133.4 lb

## 2018-06-21 DIAGNOSIS — J019 Acute sinusitis, unspecified: Secondary | ICD-10-CM | POA: Diagnosis not present

## 2018-06-21 MED ORDER — AZITHROMYCIN 250 MG PO TABS: ORAL_TABLET | ORAL | 0 refills | Status: DC

## 2018-06-21 NOTE — Telephone Encounter (Signed)
Please Advise

## 2018-06-21 NOTE — Telephone Encounter (Signed)
[facility]-   Pt is requesting to transfer to Dr Sarajane Jews.  Sarajane Jews-   Patient is requesting to transfer to you.

## 2018-06-21 NOTE — Progress Notes (Signed)
   Subjective:    Patient ID: Sarah Hall, female    DOB: 05-17-1961, 57 y.o.   MRN: [id number]  HPI Here for PND, a ST, and a dry cough that started 5 weeks ago. No fever or head congestion. She went to urgent care twice in the past few weeks. One time she was given 6 days of Prednisone, and the other she was told to use Claritin and Flonase. None of these has helped.    Review of Systems  Constitutional: Negative.   HENT: Positive for postnasal drip and sore throat. Negative for congestion, ear pain, sinus pressure and sinus pain.   Eyes: Negative.   Respiratory: Positive for cough. Negative for chest tightness, shortness of breath and wheezing.   Cardiovascular: Negative.        Objective:   Physical Exam Constitutional:      Appearance: She is well-developed. She is not ill-appearing.  HENT:     Right Ear: Tympanic membrane and ear canal normal.     Left Ear: Tympanic membrane and ear canal normal.     Nose: Nose normal.     Mouth/Throat:     Pharynx: Oropharynx is clear.  Eyes:     Conjunctiva/sclera: Conjunctivae normal.  Pulmonary:     Effort: Pulmonary effort is normal. No respiratory distress.     Breath sounds: Normal breath sounds. No stridor. No wheezing, rhonchi or rales.  Neurological:     Mental Status: She is alert.           Assessment & Plan:  Sinusitis, treat with a Zpack.  Alysia Penna, MD

## 2018-06-26 NOTE — Telephone Encounter (Signed)
ok 

## 2018-06-27 NOTE — Telephone Encounter (Signed)
Dr Volanda Napoleon is ok with the transfer

## 2018-06-29 ENCOUNTER — Ambulatory Visit: Payer: BLUE CROSS/BLUE SHIELD | Admitting: Family Medicine

## 2018-06-29 NOTE — Telephone Encounter (Signed)
Dr. Sarajane Jews are you ok with the transfer to you from Dr. Volanda Napoleon with this pt?  Thanks

## 2018-06-29 NOTE — Telephone Encounter (Signed)
Yes that's fine with me

## 2018-07-14 ENCOUNTER — Other Ambulatory Visit: Payer: Self-pay | Admitting: Family Medicine

## 2018-07-14 NOTE — Telephone Encounter (Signed)
Copied from [place] [id number]. Topic: Quick Communication - Rx Refill/Question >> Jul 14, 2018 11:41 AM Bea Graff, NT wrote: Medication: azelastine (ASTELIN) 0.1 % nasal spray  Has the patient contacted their pharmacy? Yes.   (Agent: If no, request that the patient contact the pharmacy for the refill.) (Agent: If yes, when and what did the pharmacy advise?)  Preferred Pharmacy (with phone number or street name): [facility] [place], [place] AT [facility] OF [place] [phone] (Phone) [phone] (Fax)    Agent: Please be advised that RX refills may take up to 3 business days. We ask that you follow-up with your pharmacy.

## 2018-07-14 NOTE — Telephone Encounter (Signed)
Will route to office for final disposition; pt has 2 different strengths of medication ordered (0.1% nasal spray ) per Dr Elease Hashimoto written 02/12/16, and ( 0.15% solution) per Dr Sherren Mocha written 09/29/17; pt last seen by Dr Volanda Napoleon 06/09/18.

## 2018-07-14 NOTE — Telephone Encounter (Signed)
Please advise what dose of nasal spray pt should be on.

## 2018-07-15 MED ORDER — AZELASTINE HCL 0.1 % NA SOLN: 2.0000 | Freq: Two times a day (BID) | NASAL | 0 refills | Status: DC

## 2018-07-19 ENCOUNTER — Other Ambulatory Visit: Payer: Self-pay | Admitting: Family Medicine

## 2018-07-19 DIAGNOSIS — F900 Attention-deficit hyperactivity disorder, predominantly inattentive type: Secondary | ICD-10-CM

## 2018-07-19 NOTE — Telephone Encounter (Signed)
Copied from [place] [id number]. Topic: Quick Communication - Rx Refill/Question >> Jul 19, 2018  3:45 PM Marin Olp L wrote: Medication: amphetamine-dextroamphetamine (ADDERALL) 20 MG tablet   Has the patient contacted their pharmacy? Yes.   (Agent: If no, request that the patient contact the pharmacy for the refill.) (Agent: If yes, when and what did the pharmacy advise?)  Preferred Pharmacy (with phone number or street name): [facility] [place] [facility] [place], [address] AT [place]  [place] [address] Phone: [phone] Fax: [phone]  Agent: Please be advised that RX refills may take up to 3 business days. We ask that you follow-up with your pharmacy.

## 2018-07-26 ENCOUNTER — Ambulatory Visit: Payer: 59 | Admitting: Family Medicine

## 2018-07-26 ENCOUNTER — Encounter: Payer: Self-pay | Admitting: Family Medicine

## 2018-07-26 VITALS — BP 138/70 | HR 82 | Temp 98.5°F | Ht 62.75 in | Wt 135.0 lb

## 2018-07-26 DIAGNOSIS — G8929 Other chronic pain: Secondary | ICD-10-CM

## 2018-07-26 DIAGNOSIS — I10 Essential (primary) hypertension: Secondary | ICD-10-CM | POA: Diagnosis not present

## 2018-07-26 DIAGNOSIS — E039 Hypothyroidism, unspecified: Secondary | ICD-10-CM

## 2018-07-26 DIAGNOSIS — F339 Major depressive disorder, recurrent, unspecified: Secondary | ICD-10-CM | POA: Diagnosis not present

## 2018-07-26 DIAGNOSIS — M5442 Lumbago with sciatica, left side: Secondary | ICD-10-CM | POA: Diagnosis not present

## 2018-07-26 DIAGNOSIS — F900 Attention-deficit hyperactivity disorder, predominantly inattentive type: Secondary | ICD-10-CM | POA: Diagnosis not present

## 2018-07-26 MED ORDER — AMPHETAMINE-DEXTROAMPHETAMINE 20 MG PO TABS: 20.0000 mg | ORAL_TABLET | Freq: Two times a day (BID) | ORAL | 0 refills | Status: DC

## 2018-07-26 NOTE — Progress Notes (Signed)
   Subjective:    Patient ID: Sarah Hall, female    DOB: October 16, 1960, 58 y.o.   MRN: [id number]  HPI Here to establish after transfering from Dr. Volanda Napoleon. She is doing well today and she is happy to say that she recovered from the recent sinus infection we treated her for. She takes Adderall for ADHD and she asks for refills. This has been working well for her. She has a long hx of depression and she took medications for this for over 20 years. However she took herself off medication over a year ago and she has done well. She sees a therapist regularly, she gets deep tissue massages, and she works out 4 days a week. All this helps to reduce her stress. She has some low back pain and she sees Dr. Jola Baptist once a month for chiropractic sessions. She wears a lift in the left shoe to correct a slight limb length discrepancy. She sees Dr. Ronita Hipps for GYN care and he manages her hypothyroidism as well.    Review of Systems  Constitutional: Negative.   HENT: Negative.   Eyes: Negative.   Respiratory: Negative.   Cardiovascular: Negative.   Musculoskeletal: Positive for back pain.  Neurological: Negative.   Psychiatric/Behavioral: Negative.        Objective:   Physical Exam Constitutional:      Appearance: Normal appearance.  Cardiovascular:     Rate and Rhythm: Normal rate and regular rhythm.     Pulses: Normal pulses.     Heart sounds: Normal heart sounds.  Pulmonary:     Effort: Pulmonary effort is normal.     Breath sounds: Normal breath sounds.  Neurological:     General: No focal deficit present.     Mental Status: She is alert and oriented to person, place, and time.           Assessment & Plan:  She seems to be doing well. Her hypothyroidism and depression and low back pain are stablel her ADHD is well manages and we refilled Adderall for 3 months. She will return soon for a complete exam and fasting labs. Alysia Penna, MD

## 2018-08-16 ENCOUNTER — Ambulatory Visit (INDEPENDENT_AMBULATORY_CARE_PROVIDER_SITE_OTHER): Payer: 59 | Admitting: Family Medicine

## 2018-08-16 ENCOUNTER — Encounter: Payer: Self-pay | Admitting: Family Medicine

## 2018-08-16 VITALS — BP 140/90 | HR 77 | Temp 97.5°F | Ht 63.0 in | Wt 133.2 lb

## 2018-08-16 DIAGNOSIS — Z23 Encounter for immunization: Secondary | ICD-10-CM

## 2018-08-16 DIAGNOSIS — Z0000 Encounter for general adult medical examination without abnormal findings: Secondary | ICD-10-CM

## 2018-08-16 LAB — POC URINALSYSI DIPSTICK (AUTOMATED)
Bilirubin, UA: NEGATIVE
Blood, UA: NEGATIVE
Glucose, UA: NEGATIVE
Ketones, UA: NEGATIVE
Leukocytes, UA: NEGATIVE
Nitrite, UA: NEGATIVE
Protein, UA: NEGATIVE
Spec Grav, UA: 1.015 (ref 1.010–1.025)
Urobilinogen, UA: 0.2 U/dL
pH, UA: 7 (ref 5.0–8.0)

## 2018-08-16 LAB — HEPATIC FUNCTION PANEL
ALT: 25 U/L (ref 0–35)
AST: 28 U/L (ref 0–37)
Albumin: 4.5 g/dL (ref 3.5–5.2)
Alkaline Phosphatase: 79 U/L (ref 39–117)
Bilirubin, Direct: 0.1 mg/dL (ref 0.0–0.3)
Total Bilirubin: 0.5 mg/dL (ref 0.2–1.2)
Total Protein: 6.7 g/dL (ref 6.0–8.3)

## 2018-08-16 LAB — LIPID PANEL
Cholesterol: 187 mg/dL (ref 0–200)
HDL: 91.5 mg/dL (ref >39.00)
LDL Cholesterol: 86 mg/dL (ref 0–99)
NonHDL: 95.92
Total CHOL/HDL Ratio: 2
Triglycerides: 50 mg/dL (ref 0.0–149.0)
VLDL: 10 mg/dL (ref 0.0–40.0)

## 2018-08-16 LAB — CBC WITH DIFFERENTIAL/PLATELET
Basophils Absolute: 0 10*3/uL (ref 0.0–0.1)
Basophils Relative: 0.4 % (ref 0.0–3.0)
Eosinophils Absolute: 0 10*3/uL (ref 0.0–0.7)
Eosinophils Relative: 0.6 % (ref 0.0–5.0)
HCT: 39.7 % (ref 36.0–46.0)
Hemoglobin: 13.3 g/dL (ref 12.0–15.0)
Lymphocytes Relative: 42.4 % (ref 12.0–46.0)
Lymphs Abs: 2.3 10*3/uL (ref 0.7–4.0)
MCHC: 33.5 g/dL (ref 30.0–36.0)
MCV: 91.5 fL (ref 78.0–100.0)
Monocytes Absolute: 0.4 10*3/uL (ref 0.1–1.0)
Monocytes Relative: 7.7 % (ref 3.0–12.0)
Neutro Abs: 2.6 10*3/uL (ref 1.4–7.7)
Neutrophils Relative %: 48.9 % (ref 43.0–77.0)
Platelets: 196 10*3/uL (ref 150.0–400.0)
RBC: 4.34 Mil/uL (ref 3.87–5.11)
RDW: 14 % (ref 11.5–15.5)
WBC: 5.3 10*3/uL (ref 4.0–10.5)

## 2018-08-16 LAB — BASIC METABOLIC PANEL
BUN: 14 mg/dL (ref 6–23)
CO2: 29 meq/L (ref 19–32)
Calcium: 9 mg/dL (ref 8.4–10.5)
Chloride: 101 meq/L (ref 96–112)
Creatinine, Ser: 0.62 mg/dL (ref 0.40–1.20)
GFR: 99.7 mL/min (ref >60.00)
Glucose, Bld: 82 mg/dL (ref 70–99)
Potassium: 4.3 meq/L (ref 3.5–5.1)
Sodium: 139 meq/L (ref 135–145)

## 2018-08-16 NOTE — Progress Notes (Signed)
   Subjective:    Patient ID: Sarah Hall, female    DOB: 1961/04/25, 58 y.o.   MRN: [id number]  HPI Here for a well exam. She feels great. She sees Dr. Ronita Hipps regularly for GYN exams, and he manages her thyroid medication. She sees Metta Clines NP for her depression.    Review of Systems  Constitutional: Negative.   HENT: Negative.   Eyes: Negative.   Respiratory: Negative.   Cardiovascular: Negative.   Gastrointestinal: Negative.   Genitourinary: Negative for decreased urine volume, difficulty urinating, dyspareunia, dysuria, enuresis, flank pain, frequency, hematuria, pelvic pain and urgency.  Musculoskeletal: Negative.   Skin: Negative.   Neurological: Negative.   Psychiatric/Behavioral: Negative.        Objective:   Physical Exam Constitutional:      General: She is not in acute distress.    Appearance: She is well-developed.  HENT:     Head: Normocephalic and atraumatic.     Right Ear: External ear normal.     Left Ear: External ear normal.     Nose: Nose normal.     Mouth/Throat:     Pharynx: No oropharyngeal exudate.  Eyes:     General: No scleral icterus.    Conjunctiva/sclera: Conjunctivae normal.     Pupils: Pupils are equal, round, and reactive to light.  Neck:     Musculoskeletal: Normal range of motion and neck supple.     Thyroid: No thyromegaly.     Vascular: No JVD.  Cardiovascular:     Rate and Rhythm: Normal rate and regular rhythm.     Heart sounds: Normal heart sounds. No murmur. No friction rub. No gallop.   Pulmonary:     Effort: Pulmonary effort is normal. No respiratory distress.     Breath sounds: Normal breath sounds. No wheezing or rales.  Chest:     Chest wall: No tenderness.  Abdominal:     General: Bowel sounds are normal. There is no distension.     Palpations: Abdomen is soft. There is no mass.     Tenderness: There is no abdominal tenderness. There is no guarding or rebound.  Musculoskeletal: Normal range of motion.       General: No tenderness.  Lymphadenopathy:     Cervical: No cervical adenopathy.  Skin:    General: Skin is warm and dry.     Findings: No erythema or rash.  Neurological:     Mental Status: She is alert and oriented to person, place, and time.     Cranial Nerves: No cranial nerve deficit.     Motor: No abnormal muscle tone.     Coordination: Coordination normal.     Deep Tendon Reflexes: Reflexes are normal and symmetric. Reflexes normal.  Psychiatric:        Behavior: Behavior normal.        Thought Content: Thought content normal.        Judgment: Judgment normal.           Assessment & Plan:  Well exam. We discussed diet and exercise. Get fasting labs.  Alysia Penna, MD

## 2018-08-18 DIAGNOSIS — F319 Bipolar disorder, unspecified: Secondary | ICD-10-CM | POA: Diagnosis not present

## 2018-08-19 ENCOUNTER — Encounter: Payer: Self-pay | Admitting: *Deleted

## 2018-11-24 ENCOUNTER — Telehealth: Payer: Self-pay | Admitting: Family Medicine

## 2018-11-24 DIAGNOSIS — F900 Attention-deficit hyperactivity disorder, predominantly inattentive type: Secondary | ICD-10-CM

## 2018-11-24 NOTE — Telephone Encounter (Signed)
Copied from [place] [id number]. Topic: Quick Communication - Rx Refill/Question >> Nov 24, 2018  3:21 PM Sarah Hall wrote: Medication: amphetamine-dextroamphetamine (ADDERALL) 20 MG tablet [[id number]]  ENDED   Has the patient contacted their pharmacy? No. (Agent: If no, request that the patient contact the pharmacy for the refill.) (Agent: If yes, when and what did the pharmacy advise?)  Preferred Pharmacy (with phone number or street name): [id number] - [place], [address] AT [place] [phone] (Phone)   Agent: Please be advised that RX refills may take up to 3 business days. We ask that you follow-up with your pharmacy.

## 2018-11-28 MED ORDER — AMPHETAMINE-DEXTROAMPHETAMINE 20 MG PO TABS: 20.0000 mg | ORAL_TABLET | Freq: Two times a day (BID) | ORAL | 0 refills | Status: DC

## 2018-11-28 NOTE — Telephone Encounter (Signed)
Done

## 2018-11-28 NOTE — Telephone Encounter (Signed)
Dr. Fry please advise on refill. Thanks  

## 2018-12-21 ENCOUNTER — Other Ambulatory Visit: Payer: Self-pay | Admitting: Family Medicine

## 2019-03-30 ENCOUNTER — Other Ambulatory Visit: Payer: Self-pay | Admitting: Family Medicine

## 2019-03-30 MED ORDER — AZELASTINE HCL 0.1 % NA SOLN: NASAL | 0 refills | Status: DC

## 2019-03-30 NOTE — Telephone Encounter (Signed)
Medication Refill - Medication: azelastine    Preferred Pharmacy (with phone number or street name):  [id number] - [place], [place] AT [address]  [address] [place] [address]  Phone: [phone] Fax: [phone]  Not a 24 hour pharmacy; exact hours not known.     Agent: Please be advised that RX refills may take up to 3 business days. We ask that you follow-up with your pharmacy.

## 2019-04-12 ENCOUNTER — Encounter: Payer: Self-pay | Admitting: Family Medicine

## 2019-06-05 ENCOUNTER — Other Ambulatory Visit: Payer: Self-pay | Admitting: Family Medicine

## 2019-06-05 DIAGNOSIS — F900 Attention-deficit hyperactivity disorder, predominantly inattentive type: Secondary | ICD-10-CM

## 2019-06-05 NOTE — Telephone Encounter (Signed)
Last filled 01/28/2019 Last OV 08/16/2018  Ok to fill?

## 2019-06-05 NOTE — Telephone Encounter (Signed)
Medication Refill - Medication: amphetamine-dextroamphetamine (ADDERALL) 20 MG tablet [id number] ENDED    Preferred Pharmacy (with phone number or street name) [id number] [place], [address] [address] AT [place]  [place] [address]  Phone: [phone] Fax: [phone]   :   Agent: Please be advised that RX refills may take up to 3 business days. We ask that you follow-up with your pharmacy.

## 2019-06-05 NOTE — Telephone Encounter (Signed)
Requested medication (s) are due for refill today:yes  Requested medication (s) are on the active medication list: yes  Last refill:  01/28/2019  Future visit scheduled:no  Notes to clinic:  refill cannot be delegated    Requested Prescriptions  Pending Prescriptions Disp Refills   amphetamine-dextroamphetamine (ADDERALL) 20 MG tablet 60 tablet 0    Sig: Take 1 tablet (20 mg total) by mouth 2 (two) times daily.      Not Delegated - Psychiatry:  Stimulants/ADHD Failed - 06/05/2019  2:46 PM      Failed - This refill cannot be delegated      Failed - Urine Drug Screen completed in last 360 days.      Failed - Valid encounter within last 3 months    Recent Outpatient Visits           9 months ago Preventative health care   [employer] [facility] [place], [place]   10 months ago Attention deficit hyperactivity disorder (ADHD), predominantly inattentive type   Therapist, music at [place], [place]   11 months ago Acute sinusitis, recurrence not specified, unspecified location   [employer] [facility] at [place], [place]   12 months ago Attention deficit hyperactivity disorder (ADHD), predominantly inattentive type   Therapist, music at [employer], Langley Adie, MD   2 years ago Immunity status Lobbyist at [employer], Jory Ee, MD

## 2019-06-06 MED ORDER — AMPHETAMINE-DEXTROAMPHETAMINE 20 MG PO TABS: 20.0000 mg | ORAL_TABLET | Freq: Two times a day (BID) | ORAL | 0 refills | Status: DC

## 2019-06-06 NOTE — Telephone Encounter (Signed)
Done

## 2019-08-04 ENCOUNTER — Telehealth: Payer: Self-pay | Admitting: Family Medicine

## 2019-08-04 MED ORDER — AZELASTINE HCL 0.1 % NA SOLN: NASAL | 0 refills | Status: DC

## 2019-08-04 NOTE — Telephone Encounter (Signed)
Rx has been sent in. Patient is aware. 

## 2019-08-04 NOTE — Telephone Encounter (Signed)
Medication Refill: Azelastine  Pharmacy: [id number] [address] Fax: [phone]   Pt stated the pharmacy has tried faxing over a refill request and hasn't heard anything back

## 2019-08-07 ENCOUNTER — Telehealth (INDEPENDENT_AMBULATORY_CARE_PROVIDER_SITE_OTHER): Payer: 59 | Admitting: Family Medicine

## 2019-08-07 ENCOUNTER — Other Ambulatory Visit: Payer: Self-pay

## 2019-08-07 DIAGNOSIS — G4700 Insomnia, unspecified: Secondary | ICD-10-CM

## 2019-08-07 MED ORDER — TEMAZEPAM 15 MG PO CAPS: 15.0000 mg | ORAL_CAPSULE | Freq: Every evening | ORAL | 2 refills | Status: DC | PRN

## 2019-08-07 NOTE — Progress Notes (Signed)
Virtual Visit via Telephone Note  I connected with the patient on 08/07/19 at  1:30 PM EST by telephone and verified that I am speaking with the correct person using two identifiers.   I discussed the limitations, risks, security and privacy concerns of performing an evaluation and management service by telephone and the availability of in person appointments. I also discussed with the patient that there may be a patient responsible charge related to this service. The patient expressed understanding and agreed to proceed.  Location patient: home Location provider: work or home office Participants present for the call: patient, provider Patient did not have a visit in the prior 7 days to address this/these issue(s).   History of Present Illness: Here to ask for help with sleep. She has struggles with this for years and she has tried numerous things OTC with mixed results including Benadryl and Melatonin.    Observations/Objective: Patient sounds cheerful and well on the phone. I do not appreciate any SOB. Speech and thought processing are grossly intact. Patient reported vitals:  Assessment and Plan: Insomnia, try Temazepam 15 mg qhs. Alysia Penna, MD   Follow Up Instructions:     [phone] 5-10 [id number] 11-20 [id number] 21-30 I did not refer this patient for an OV in the next 24 hours for this/these issue(s).  I discussed the assessment and treatment plan with the patient. The patient was provided an opportunity to ask questions and all were answered. The patient agreed with the plan and demonstrated an understanding of the instructions.   The patient was advised to call back or seek an in-person evaluation if the symptoms worsen or if the condition fails to improve as anticipated.  I provided 10 minutes of non-face-to-face time during this encounter.   Alysia Penna, MD

## 2019-09-06 ENCOUNTER — Telehealth: Payer: Self-pay | Admitting: Family Medicine

## 2019-09-06 NOTE — Telephone Encounter (Signed)
Pt states that the temazepam dosage does not work for her. She tried taking 2 capsules and that seemed to work for the pt without feeling groggy. She is wondering if fry would call in a prescription for double the dosage (30 MG)? Pt has one pill left   Mediation refill: Temazepam   Pharmacy: [place]: [phone]

## 2019-09-08 MED ORDER — TEMAZEPAM 30 MG PO CAPS: 30.0000 mg | ORAL_CAPSULE | Freq: Every evening | ORAL | 5 refills | Status: DC | PRN

## 2019-09-08 NOTE — Telephone Encounter (Signed)
Yes I sent in for her to get the higher dosage (30 mg)

## 2019-09-08 NOTE — Telephone Encounter (Signed)
See below

## 2019-10-31 ENCOUNTER — Telehealth: Payer: Self-pay | Admitting: Family Medicine

## 2019-10-31 DIAGNOSIS — F900 Attention-deficit hyperactivity disorder, predominantly inattentive type: Secondary | ICD-10-CM

## 2019-10-31 NOTE — Telephone Encounter (Signed)
Pt call and need a refill on amphetamine-dextroamphetamine (ADDERALL) 20 MG tablet and azelastine (ASTELIN) 0.1 % nasal spray  sent to azelastine (ASTELIN) 0.1 % nasal spray azelastine (ASTELIN) 0.1 % nasal spray [id number] - [place], [place] [address] AT [address] Phone:  [phone]  Fax:  [phone]

## 2019-10-31 NOTE — Telephone Encounter (Signed)
Last filled 08/07/2019 Last OV 08/07/2019 ( acute)  Ok to fill?

## 2019-11-01 MED ORDER — AMPHETAMINE-DEXTROAMPHETAMINE 20 MG PO TABS: 20.0000 mg | ORAL_TABLET | Freq: Two times a day (BID) | ORAL | 0 refills | Status: DC

## 2019-11-01 MED ORDER — AZELASTINE HCL 0.1 % NA SOLN: NASAL | 11 refills | Status: DC

## 2019-11-01 NOTE — Telephone Encounter (Signed)
Done

## 2019-12-15 ENCOUNTER — Telehealth: Payer: Self-pay | Admitting: Family Medicine

## 2019-12-15 NOTE — Telephone Encounter (Signed)
Patient is getting labs done at her physical appt in July and wants to know if someone could look at her chart and call her to let her know if there are any of her medications she shouldn't take the morning of her appt.  It is ok to leave a message on her answering machine.

## 2019-12-15 NOTE — Telephone Encounter (Signed)
Please advise. Appointment is July 19th

## 2019-12-19 NOTE — Telephone Encounter (Signed)
Spoke with the patient. She is aware that she can take all her medications, the morning of her appointment with water.

## 2019-12-19 NOTE — Telephone Encounter (Signed)
She should take all her usual medications that morning with water

## 2019-12-22 ENCOUNTER — Encounter: Payer: 59 | Admitting: Family Medicine

## 2020-01-08 ENCOUNTER — Other Ambulatory Visit: Payer: Self-pay

## 2020-01-08 ENCOUNTER — Encounter: Payer: Self-pay | Admitting: Family Medicine

## 2020-01-08 ENCOUNTER — Ambulatory Visit (INDEPENDENT_AMBULATORY_CARE_PROVIDER_SITE_OTHER): Payer: 59 | Admitting: Family Medicine

## 2020-01-08 VITALS — BP 140/80 | HR 84 | Ht 63.0 in | Wt 126.6 lb

## 2020-01-08 DIAGNOSIS — M67442 Ganglion, left hand: Secondary | ICD-10-CM | POA: Diagnosis not present

## 2020-01-08 DIAGNOSIS — Z0000 Encounter for general adult medical examination without abnormal findings: Secondary | ICD-10-CM | POA: Diagnosis not present

## 2020-01-08 NOTE — Progress Notes (Signed)
   Subjective:    Patient ID: Sarah Hall, female    DOB: February 04, 1961, 59 y.o.   MRN: [id number]  HPI Here for a well exam. She feels fine in general but she shows me a tender lump on the left 4th finger that appeared about 6 months ago. She gets a mammogram yearly. She will be seeing Dr. Ronita Hipps next week for a GYN exam and he also monitors her thyroid levels.    Review of Systems  Constitutional: Negative.   HENT: Negative.   Eyes: Negative.   Respiratory: Negative.   Cardiovascular: Negative.   Gastrointestinal: Negative.   Genitourinary: Negative for decreased urine volume, difficulty urinating, dyspareunia, dysuria, enuresis, flank pain, frequency, hematuria, pelvic pain and urgency.  Musculoskeletal: Negative.   Skin: Negative.   Neurological: Negative.   Psychiatric/Behavioral: Negative.        Objective:   Physical Exam Constitutional:      General: She is not in acute distress.    Appearance: Normal appearance. She is well-developed.  HENT:     Head: Normocephalic and atraumatic.     Right Ear: External ear normal.     Left Ear: External ear normal.     Nose: Nose normal.     Mouth/Throat:     Pharynx: No oropharyngeal exudate.  Eyes:     General: No scleral icterus.    Conjunctiva/sclera: Conjunctivae normal.     Pupils: Pupils are equal, round, and reactive to light.  Neck:     Thyroid: No thyromegaly.     Vascular: No JVD.  Cardiovascular:     Rate and Rhythm: Normal rate and regular rhythm.     Heart sounds: Normal heart sounds. No murmur heard.  No friction rub. No gallop.   Pulmonary:     Effort: Pulmonary effort is normal. No respiratory distress.     Breath sounds: Normal breath sounds. No wheezing or rales.  Chest:     Chest wall: No tenderness.  Abdominal:     General: Bowel sounds are normal. There is no distension.     Palpations: Abdomen is soft. There is no mass.     Tenderness: There is no abdominal tenderness. There is no guarding or  rebound.  Musculoskeletal:        General: No tenderness. Normal range of motion.     Cervical back: Normal range of motion and neck supple.     Comments: The flexor side of the left 4th finger PIP joint has a small tender round firm mobile mass   Lymphadenopathy:     Cervical: No cervical adenopathy.  Skin:    General: Skin is warm and dry.     Findings: No erythema or rash.  Neurological:     Mental Status: She is alert and oriented to person, place, and time.     Cranial Nerves: No cranial nerve deficit.     Motor: No abnormal muscle tone.     Coordination: Coordination normal.     Deep Tendon Reflexes: Reflexes are normal and symmetric. Reflexes normal.  Psychiatric:        Behavior: Behavior normal.        Thought Content: Thought content normal.        Judgment: Judgment normal.           Assessment & Plan:  Well exam. We discussed diet and exercise. Get fasting labs. For the ganglion cyst, we will refer her to Hand Surgery. Alysia Penna, MD

## 2020-01-09 LAB — CBC WITH DIFFERENTIAL/PLATELET
Absolute Monocytes: 489 {cells}/uL (ref 200–950)
Basophils Absolute: 42 {cells}/uL (ref 0–200)
Basophils Relative: 0.8 %
Eosinophils Absolute: 31 {cells}/uL (ref 15–500)
Eosinophils Relative: 0.6 %
HCT: 39.1 % (ref 35.0–45.0)
Hemoglobin: 13 g/dL (ref 11.7–15.5)
Lymphs Abs: 1477 {cells}/uL (ref 850–3900)
MCH: 29.3 pg (ref 27.0–33.0)
MCHC: 33.2 g/dL (ref 32.0–36.0)
MCV: 88.3 fL (ref 80.0–100.0)
MPV: 11.9 fL (ref 7.5–12.5)
Monocytes Relative: 9.4 %
Neutro Abs: 3162 {cells}/uL (ref 1500–7800)
Neutrophils Relative %: 60.8 %
Platelets: 232 10*3/uL (ref 140–400)
RBC: 4.43 10*6/uL (ref 3.80–5.10)
RDW: 14.9 % (ref 11.0–15.0)
Total Lymphocyte: 28.4 %
WBC: 5.2 10*3/uL (ref 3.8–10.8)

## 2020-01-09 LAB — HEPATIC FUNCTION PANEL
AG Ratio: 2.1 (calc) (ref 1.0–2.5)
ALT: 18 U/L (ref 6–29)
AST: 21 U/L (ref 10–35)
Albumin: 4.7 g/dL (ref 3.6–5.1)
Alkaline phosphatase (APISO): 77 U/L (ref 37–153)
Bilirubin, Direct: 0.1 mg/dL (ref 0.0–0.2)
Globulin: 2.2 g/dL (ref 1.9–3.7)
Indirect Bilirubin: 0.5 mg/dL (ref 0.2–1.2)
Total Bilirubin: 0.6 mg/dL (ref 0.2–1.2)
Total Protein: 6.9 g/dL (ref 6.1–8.1)

## 2020-01-09 LAB — BASIC METABOLIC PANEL
BUN: 10 mg/dL (ref 7–25)
CO2: 29 mmol/L (ref 20–32)
Calcium: 9.9 mg/dL (ref 8.6–10.4)
Chloride: 101 mmol/L (ref 98–110)
Creat: 0.59 mg/dL (ref 0.50–1.05)
Glucose, Bld: 91 mg/dL (ref 65–99)
Potassium: 4 mmol/L (ref 3.5–5.3)
Sodium: 139 mmol/L (ref 135–146)

## 2020-01-09 LAB — LIPID PANEL
Cholesterol: 228 mg/dL — ABNORMAL HIGH (ref <200)
HDL: 105 mg/dL (ref >=50)
LDL Cholesterol (Calc): 107 mg/dL — ABNORMAL HIGH
Non-HDL Cholesterol (Calc): 123 mg/dL (ref <130)
Total CHOL/HDL Ratio: 2.2 (calc) (ref <5.0)
Triglycerides: 74 mg/dL (ref <150)

## 2020-02-19 ENCOUNTER — Telehealth: Payer: Self-pay | Admitting: Family Medicine

## 2020-02-19 DIAGNOSIS — F900 Attention-deficit hyperactivity disorder, predominantly inattentive type: Secondary | ICD-10-CM

## 2020-02-19 NOTE — Telephone Encounter (Signed)
Pt is calling in stating that she if out of her Rx ADDERALL.  Pharm:  [facility] on [address]

## 2020-02-19 NOTE — Telephone Encounter (Signed)
Last filled 01/01/2020 Last OV 01/08/2020  Ok to fill?

## 2020-02-20 MED ORDER — AMPHETAMINE-DEXTROAMPHETAMINE 20 MG PO TABS: 20.0000 mg | ORAL_TABLET | Freq: Two times a day (BID) | ORAL | 0 refills | Status: DC

## 2020-02-20 NOTE — Telephone Encounter (Signed)
Done

## 2020-03-15 ENCOUNTER — Other Ambulatory Visit: Payer: Self-pay | Admitting: Family Medicine

## 2020-03-15 DIAGNOSIS — G4700 Insomnia, unspecified: Secondary | ICD-10-CM

## 2020-03-15 NOTE — Telephone Encounter (Signed)
Ordered 09/08/19 - #30 x5  Last OV 01/08/20  Last labs 01/08/20  No current controlled substance contract on file.

## 2020-03-17 NOTE — Telephone Encounter (Signed)
I did not see this refill request until today.  Refilled enough until Dr Sarajane Jews returns.

## 2020-03-20 MED ORDER — TEMAZEPAM 30 MG PO CAPS: ORAL_CAPSULE | ORAL | 5 refills | Status: DC

## 2020-03-20 NOTE — Addendum Note (Signed)
Addended by: Alysia Penna A on: 03/20/2020 01:18 PM   Modules accepted: Orders

## 2020-03-20 NOTE — Telephone Encounter (Signed)
Done

## 2020-04-09 ENCOUNTER — Telehealth: Payer: Self-pay | Admitting: Family Medicine

## 2020-04-09 NOTE — Telephone Encounter (Addendum)
Pt call and stated she received 1 refill for 30 on 03/25/20 when dr.Fry was out and want to get the  refill on the other 60 amphetamine-dextroam (ADDERALL) 20 MG tablet  semt to  [facility], [address] Phone:  [phone]  Fax:  [phone]

## 2020-04-10 NOTE — Telephone Encounter (Signed)
She already has refills until 05-20-20

## 2020-04-11 NOTE — Telephone Encounter (Signed)
[place] called and spoke to [employer], [employer] officer about the Adderall. She says the patient picked 60 pills on 03/25/20 and is due for a refill of the script on file on 04/22/20. I called the patient and advised, she verbalized understanding,

## 2020-05-03 ENCOUNTER — Other Ambulatory Visit: Payer: Self-pay | Admitting: Obstetrics and Gynecology

## 2020-05-03 DIAGNOSIS — R928 Other abnormal and inconclusive findings on diagnostic imaging of breast: Secondary | ICD-10-CM

## 2020-05-06 ENCOUNTER — Ambulatory Visit
Admission: RE | Admit: 2020-05-06 | Discharge: 2020-05-06 | Disposition: A | Discharge status: Home / Self Care | Payer: 59 | Source: Ambulatory Visit | Attending: Obstetrics and Gynecology | Admitting: Obstetrics and Gynecology

## 2020-05-06 ENCOUNTER — Other Ambulatory Visit: Payer: Self-pay | Admitting: Obstetrics and Gynecology

## 2020-05-06 ENCOUNTER — Other Ambulatory Visit: Payer: Self-pay

## 2020-05-06 DIAGNOSIS — R921 Mammographic calcification found on diagnostic imaging of breast: Secondary | ICD-10-CM

## 2020-05-06 DIAGNOSIS — R928 Other abnormal and inconclusive findings on diagnostic imaging of breast: Secondary | ICD-10-CM

## 2020-05-14 ENCOUNTER — Ambulatory Visit
Admission: RE | Admit: 2020-05-14 | Discharge: 2020-05-14 | Disposition: A | Discharge status: Home / Self Care | Payer: 59 | Source: Ambulatory Visit | Attending: Obstetrics and Gynecology | Admitting: Obstetrics and Gynecology

## 2020-05-14 ENCOUNTER — Other Ambulatory Visit: Payer: Self-pay

## 2020-05-14 DIAGNOSIS — R921 Mammographic calcification found on diagnostic imaging of breast: Secondary | ICD-10-CM

## 2020-05-14 HISTORY — PX: BREAST BIOPSY: SHX20

## 2020-05-20 ENCOUNTER — Other Ambulatory Visit: Payer: Self-pay | Admitting: Obstetrics and Gynecology

## 2020-05-20 ENCOUNTER — Ambulatory Visit
Admission: RE | Admit: 2020-05-20 | Discharge: 2020-05-20 | Disposition: A | Discharge status: Home / Self Care | Payer: 59 | Source: Ambulatory Visit | Attending: Obstetrics and Gynecology | Admitting: Obstetrics and Gynecology

## 2020-05-20 ENCOUNTER — Other Ambulatory Visit: Payer: Self-pay

## 2020-05-20 ENCOUNTER — Encounter: Payer: Self-pay | Admitting: *Deleted

## 2020-05-20 DIAGNOSIS — C50919 Malignant neoplasm of unspecified site of unspecified female breast: Secondary | ICD-10-CM

## 2020-05-20 DIAGNOSIS — D0511 Intraductal carcinoma in situ of right breast: Secondary | ICD-10-CM | POA: Insufficient documentation

## 2020-05-21 NOTE — Progress Notes (Signed)
[facility] NOTE  Patient Care Team: Laurey Morale, MD as PCP - General (Family Medicine) Mauro Kaufmann, RN as Oncology Nurse Navigator Rockwell Germany, RN as Oncology Nurse Navigator Donnie Mesa, MD as Consulting Physician (General Surgery) Nicholas Lose, MD as Consulting Physician (Hematology and Oncology) Kyung Rudd, MD as Consulting Physician (Radiation Oncology)  CHIEF COMPLAINTS/PURPOSE OF CONSULTATION:  Newly diagnosed breast cancer  HISTORY OF PRESENTING ILLNESS:  Sarah Hall 59 y.o. female is here because of recent diagnosis of ductal carcinoma in situ of the right breast. Screening mammogram showed right breast calcifications. Diagnostic mammogram on 05/06/20 showed a 1.0cm group of calcifications in the upper outer right breast. Biopsy on 05/14/20 showed DCIS, high grade, ER+ 5%, PR- 0%. She presents to the clinic today for initial evaluation and discussion of treatment options.   I reviewed her records extensively and collaborated the history with the patient.  SUMMARY OF ONCOLOGIC HISTORY: Oncology History  Ductal carcinoma in situ (DCIS) of right breast  05/20/2020 Initial Diagnosis   Screening mammogram showed right breast calcifications. Diagnostic mammogram showed a 1.0cm group of calcifications in the upper outer right breast. Biopsy showed DCIS, high grade, ER+ 5%, PR- 0%.       MEDICAL HISTORY:  Past Medical History:  Diagnosis Date  . ADHD (attention deficit hyperactivity disorder), inattentive type   . Ankle sprain    right ankle  . Breast cancer ([place])   . Deep breathing    hard to take a deep breath due to thyroid issue  . Depression    Bipolar depression, sees Metta Clines NP   . Hypothyroidism    cared for by Dr. Ronita Hipps     SURGICAL HISTORY: Past Surgical History:  Procedure Laterality Date  . COLONOSCOPY  09/03/2014   per Dr. Fuller Plan, benign polyps, repeat in 10 yrs   . DILATION AND EVACUATION      2  times  . scraping of uterus     20 years ago  . WISDOM TOOTH EXTRACTION      SOCIAL HISTORY: Social History   Socioeconomic History  . Marital status: Legally Separated    Spouse name: Not on file  . Number of children: Not on file  . Years of education: Not on file  . Highest education level: Not on file  Occupational History  . Not on file  Tobacco Use  . Smoking status: Never Smoker  . Smokeless tobacco: Never Used  Substance and Sexual Activity  . Alcohol use: Yes    Alcohol/week: 10.0 standard drinks    Types: 10 Glasses of wine per week  . Drug use: No  . Sexual activity: Not on file  Other Topics Concern  . Not on file  Social History Narrative  . Not on file   Social Determinants of Health   Financial Resource Strain:   . Difficulty of Paying Living Expenses: Not on file  Food Insecurity:   . Worried About Charity fundraiser in the Last Year: Not on file  . Ran Out of Food in the Last Year: Not on file  Transportation Needs:   . Lack of Transportation (Medical): Not on file  . Lack of Transportation (Non-Medical): Not on file  Physical Activity:   . Days of Exercise per Week: Not on file  . Minutes of Exercise per Session: Not on file  Stress:   . Feeling of Stress : Not on file  Social Connections:   .  Frequency of Communication with Friends and Family: Not on file  . Frequency of Social Gatherings with Friends and Family: Not on file  . Attends Religious Services: Not on file  . Active Member of Clubs or Organizations: Not on file  . Attends Archivist Meetings: Not on file  . Marital Status: Not on file  Intimate Partner Violence:   . Fear of Current or Ex-Partner: Not on file  . Emotionally Abused: Not on file  . Physically Abused: Not on file  . Sexually Abused: Not on file    FAMILY HISTORY: Family History  Problem Relation Age of Onset  . Alzheimer's disease Father   . Breast cancer Paternal Grandmother   . Colon cancer  Maternal Uncle   . Uterine cancer Maternal Grandmother   . Esophageal cancer Neg Hx   . Rectal cancer Neg Hx   . Stomach cancer Neg Hx     ALLERGIES:  is allergic to codeine phosphate.  MEDICATIONS:  Current Outpatient Medications  Medication Sig Dispense Refill  . amphetamine-dextroamphetamine (ADDERALL) 20 MG tablet Take 1 tablet (20 mg total) by mouth 2 (two) times daily. 60 tablet 0  . azelastine (ASTELIN) 0.1 % nasal spray PLACE 2 SPRAYS INTO EACH NOSTRIL TWICE DAILY AS DIRECTED 30 mL 11  . levothyroxine (SYNTHROID, LEVOTHROID) 88 MCG tablet   1  . magnesium 30 MG tablet Take 300 mg by mouth daily.     . Multiple Vitamin (MULTIVITAMIN) tablet Take 1 tablet by mouth daily.    . temazepam (RESTORIL) 30 MG capsule TAKE 1 CAPSULE(30 MG) BY MOUTH AT BEDTIME AS NEEDED FOR SLEEP 30 capsule 5  . tamoxifen (NOLVADEX) 10 MG tablet Take 1 tablet (10 mg total) by mouth daily. 30 tablet 0   No current facility-administered medications for this visit.    REVIEW OF SYSTEMS:   Constitutional: Denies fevers, chills or abnormal night sweats Breast: Denies any palpable lumps or discharge All other systems were reviewed with the patient and are negative.  PHYSICAL EXAMINATION: ECOG PERFORMANCE STATUS: 1 - Symptomatic but completely ambulatory  Vitals:   05/22/20 0928  BP: (!) 144/67  Pulse: 82  Resp: 18  Temp: 97.9 F (36.6 C)  SpO2: 100%   Filed Weights   05/22/20 0928  Weight: 123 lb 9.6 oz (56.1 kg)     LABORATORY DATA:  I have reviewed the data as listed Lab Results  Component Value Date   WBC 6.3 05/22/2020   HGB 11.5 (L) 05/22/2020   HCT 35.2 (L) 05/22/2020   MCV 92.9 05/22/2020   PLT 219 05/22/2020   Lab Results  Component Value Date   NA 141 05/22/2020   K 3.6 05/22/2020   CL 105 05/22/2020   CO2 30 05/22/2020    RADIOGRAPHIC STUDIES: I have personally reviewed the radiological reports and agreed with the findings in the report.  ASSESSMENT AND PLAN:    Ductal carcinoma in situ (DCIS) of right breast 05/20/20: Screening mammogram showed right breast calcifications. Diagnostic mammogram showed a 1.0cm group of calcifications in the upper outer right breast. Biopsy showed DCIS, high grade, ER+ 5%, PR- 0%.   Pathology review: I discussed with the patient the difference between DCIS and invasive breast cancer. It is considered a precancerous lesion. DCIS is classified as a 0. It is generally detected through mammograms as calcifications. We discussed the significance of grades and its impact on prognosis. We also discussed the importance of ER and PR receptors and their implications to  adjuvant treatment options. Prognosis of DCIS dependence on grade, comedo necrosis. It is anticipated that if not treated, 20-30% of DCIS can develop into invasive breast cancer.  Recommendation: 1. Breast conserving surgery 2. Followed by adjuvant radiation therapy 3. Followed by antiestrogen therapy with tamoxifen 5 years  Tamoxifen counseling: We discussed the risks and benefits of tamoxifen. These include but not limited to insomnia, hot flashes, mood changes, vaginal dryness, and weight gain. Although rare, serious side effects including endometrial cancer, risk of blood clots were also discussed. We strongly believe that the benefits far outweigh the risks. Patient understands these risks and consented to starting treatment. Planned treatment duration is 5 years.  Because of hematoma her surgery will be postponed until January.  Because of that I instructed her to start on tamoxifen 10 mg daily.  I sent a prescription for 30 tablets.  She will stop tamoxifen 1 week prior to surgery.  Return to clinic after surgery to discuss the final pathology report and come up with an adjuvant treatment plan.     All questions were answered. The patient knows to call the clinic with any problems, questions or concerns.   Rulon Eisenmenger, MD, MPH 05/22/2020    I, Molly  Dorshimer, am acting as scribe for Nicholas Lose, MD.  I have reviewed the above documentation for accuracy and completeness, and I agree with the above.

## 2020-05-22 ENCOUNTER — Ambulatory Visit: Payer: 59 | Attending: Surgery | Admitting: Physical Therapy

## 2020-05-22 ENCOUNTER — Encounter: Payer: Self-pay | Admitting: Hematology and Oncology

## 2020-05-22 ENCOUNTER — Other Ambulatory Visit: Payer: Self-pay

## 2020-05-22 ENCOUNTER — Inpatient Hospital Stay (HOSPITAL_BASED_OUTPATIENT_CLINIC_OR_DEPARTMENT_OTHER): Payer: 59 | Admitting: Internal Medicine

## 2020-05-22 ENCOUNTER — Inpatient Hospital Stay: Payer: 59 | Attending: Hematology and Oncology | Admitting: Hematology and Oncology

## 2020-05-22 ENCOUNTER — Ambulatory Visit: Payer: Self-pay | Admitting: Surgery

## 2020-05-22 ENCOUNTER — Ambulatory Visit
Admission: RE | Admit: 2020-05-22 | Discharge: 2020-05-22 | Disposition: A | Discharge status: Home / Self Care | Payer: 59 | Source: Ambulatory Visit | Attending: Radiation Oncology | Admitting: Radiation Oncology

## 2020-05-22 ENCOUNTER — Encounter: Payer: Self-pay | Admitting: Physical Therapy

## 2020-05-22 DIAGNOSIS — Z170 Estrogen receptor positive status [ER+]: Secondary | ICD-10-CM | POA: Insufficient documentation

## 2020-05-22 DIAGNOSIS — F319 Bipolar disorder, unspecified: Secondary | ICD-10-CM | POA: Insufficient documentation

## 2020-05-22 DIAGNOSIS — D0511 Intraductal carcinoma in situ of right breast: Secondary | ICD-10-CM

## 2020-05-22 DIAGNOSIS — R293 Abnormal posture: Secondary | ICD-10-CM | POA: Diagnosis present

## 2020-05-22 DIAGNOSIS — C50411 Malignant neoplasm of upper-outer quadrant of right female breast: Secondary | ICD-10-CM | POA: Insufficient documentation

## 2020-05-22 DIAGNOSIS — E039 Hypothyroidism, unspecified: Secondary | ICD-10-CM | POA: Diagnosis not present

## 2020-05-22 DIAGNOSIS — C50911 Malignant neoplasm of unspecified site of right female breast: Secondary | ICD-10-CM

## 2020-05-22 LAB — GENETIC SCREENING ORDER

## 2020-05-22 LAB — CMP (CANCER CENTER ONLY)
ALT: 28 U/L (ref 0–44)
AST: 27 U/L (ref 15–41)
Albumin: 4.1 g/dL (ref 3.5–5.0)
Alkaline Phosphatase: 74 U/L (ref 38–126)
Anion gap: 6 (ref 5–15)
BUN: 17 mg/dL (ref 6–20)
CO2: 30 mmol/L (ref 22–32)
Calcium: 9.3 mg/dL (ref 8.9–10.3)
Chloride: 105 mmol/L (ref 98–111)
Creatinine: 0.75 mg/dL (ref 0.44–1.00)
GFR, Estimated: >60 mL/min (ref >60)
Glucose, Bld: 95 mg/dL (ref 70–99)
Potassium: 3.6 mmol/L (ref 3.5–5.1)
Sodium: 141 mmol/L (ref 135–145)
Total Bilirubin: 0.7 mg/dL (ref 0.3–1.2)
Total Protein: 6.8 g/dL (ref 6.5–8.1)

## 2020-05-22 LAB — CBC WITH DIFFERENTIAL (CANCER CENTER ONLY)
Abs Immature Granulocytes: 0.01 10*3/uL (ref 0.00–0.07)
Basophils Absolute: 0 10*3/uL (ref 0.0–0.1)
Basophils Relative: 1 %
Eosinophils Absolute: 0.1 10*3/uL (ref 0.0–0.5)
Eosinophils Relative: 1 %
HCT: 35.2 % — ABNORMAL LOW (ref 36.0–46.0)
Hemoglobin: 11.5 g/dL — ABNORMAL LOW (ref 12.0–15.0)
Immature Granulocytes: 0 %
Lymphocytes Relative: 37 %
Lymphs Abs: 2.4 10*3/uL (ref 0.7–4.0)
MCH: 30.3 pg (ref 26.0–34.0)
MCHC: 32.7 g/dL (ref 30.0–36.0)
MCV: 92.9 fL (ref 80.0–100.0)
Monocytes Absolute: 0.6 10*3/uL (ref 0.1–1.0)
Monocytes Relative: 9 %
Neutro Abs: 3.3 10*3/uL (ref 1.7–7.7)
Neutrophils Relative %: 52 %
Platelet Count: 219 10*3/uL (ref 150–400)
RBC: 3.79 MIL/uL — ABNORMAL LOW (ref 3.87–5.11)
RDW: 14.6 % (ref 11.5–15.5)
WBC Count: 6.3 10*3/uL (ref 4.0–10.5)
nRBC: 0 % (ref 0.0–0.2)

## 2020-05-22 MED ORDER — TAMOXIFEN CITRATE 10 MG PO TABS: 10.0000 mg | ORAL_TABLET | Freq: Every day | ORAL | 0 refills | Status: DC

## 2020-05-22 NOTE — Progress Notes (Addendum)
Radiation Oncology         [phone]) [phone] ________________________________  Name: Sarah Hall        MRN: [id number]  Date of Service: 05/22/2020 DOB: [birth year]  CC:Fry, Ishmael Holter, MD  Donnie Mesa, MD     REFERRING PHYSICIAN: Donnie Mesa, MD   DIAGNOSIS: The encounter diagnosis was Ductal carcinoma in situ (DCIS) of right breast.   HISTORY OF PRESENT ILLNESS: Sarah Hall is a 59 y.o. female seen in the multidisciplinary breast clinic for a new diagnosis of right breast cancer. The patient was noted to have screening detected calcifications in the right breast, a 1 cm group of calcifications was seen in the upper outer quadrant by diagnostic imaging, her axilla was negative for adenopathy, and a biopsy on 05/14/2020 revealed high-grade DCIS with necrosis and calcifications with focal suspicion for microinvasion.  Her cancer was ER positive PR negative.  She is seen today to discuss treatment recommendations for her cancer.  In conference this morning it was discussed that she did develop a 7 cm hematoma and pseudoaneurysm within the breast following her biopsy.   PREVIOUS RADIATION THERAPY: No   PAST MEDICAL HISTORY:  Past Medical History:  Diagnosis Date  . ADHD (attention deficit hyperactivity disorder), inattentive type   . Ankle sprain    right ankle  . Deep breathing    hard to take a deep breath due to thyroid issue  . Depression    Bipolar depression, sees Metta Clines NP   . Hypothyroidism    cared for by Dr. Ronita Hipps        PAST SURGICAL HISTORY: Past Surgical History:  Procedure Laterality Date  . COLONOSCOPY  09/03/2014   per Dr. Fuller Plan, benign polyps, repeat in 10 yrs   . DILATION AND EVACUATION      2 times  . scraping of uterus     20 years ago  . WISDOM TOOTH EXTRACTION       FAMILY HISTORY:  Family History  Problem Relation Age of Onset  . Alzheimer's disease Father   . Breast cancer Paternal Grandmother   . Colon cancer Neg Hx   .  Esophageal cancer Neg Hx   . Rectal cancer Neg Hx   . Stomach cancer Neg Hx      SOCIAL HISTORY:  reports that she has never smoked. She has never used smokeless tobacco. She reports current alcohol use of about 10.0 standard drinks of alcohol per week. She reports that she does not use drugs.  The patient is separated and lives in [place].   She has a teenage son who is waiting to hear back about college applications.   ALLERGIES: Codeine phosphate   MEDICATIONS:  Current Outpatient Medications  Medication Sig Dispense Refill  . amphetamine-dextroamphetamine (ADDERALL) 20 MG tablet Take 1 tablet (20 mg total) by mouth 2 (two) times daily. 60 tablet 0  . azelastine (ASTELIN) 0.1 % nasal spray PLACE 2 SPRAYS INTO EACH NOSTRIL TWICE DAILY AS DIRECTED 30 mL 11  . levothyroxine (SYNTHROID, LEVOTHROID) 88 MCG tablet   1  . magnesium 30 MG tablet Take 300 mg by mouth daily.     . Multiple Vitamin (MULTIVITAMIN) tablet Take 1 tablet by mouth daily.    . temazepam (RESTORIL) 30 MG capsule TAKE 1 CAPSULE(30 MG) BY MOUTH AT BEDTIME AS NEEDED FOR SLEEP 30 capsule 5   No current facility-administered medications for this encounter.     REVIEW OF SYSTEMS: On review of systems, the  patient reports that she is doing well overall. She has anxiety and has depression which she has been working through at baseline but also in the last few years going through a divorce. She reports she has had some changes in her stool in the past few months including change in the odor and color. She has a history of colon polyps, but no other GI symptoms. She does have chronic back pain and chronic fatigue. She's lost about 10 pounds which she attributes to stress and changes in eating habits. She also has some night sweats and trouble sleeping. She's had swelling in the right breast following her biopsy but this is less sensitive in the last few days. No other complaints are noted.     PHYSICAL EXAM:  Wt Readings  from Last 3 Encounters:  01/08/20 126 lb 9.6 oz (57.4 kg)  08/16/18 133 lb 4 oz (60.4 kg)  07/26/18 135 lb (61.2 kg)   Temp Readings from Last 3 Encounters:  08/16/18 (!) 97.5 F (36.4 C) (Oral)  07/26/18 98.5 F (36.9 C) (Oral)  06/21/18 98.2 F (36.8 C) (Oral)   BP Readings from Last 3 Encounters:  01/08/20 140/80  08/16/18 140/90  07/26/18 138/70   Pulse Readings from Last 3 Encounters:  01/08/20 84  08/16/18 77  07/26/18 82    In general this is a well appearing Caucasian female in no acute distress. She's alert and oriented x4 and appropriate throughout the examination. Cardiopulmonary assessment is negative for acute distress and she exhibits normal effort. Bilateral breast exam is deferred.    ECOG = 1  0 - Asymptomatic (Fully active, able to carry on all predisease activities without restriction)  1 - Symptomatic but completely ambulatory (Restricted in physically strenuous activity but ambulatory and able to carry out work of a light or sedentary nature. For example, light housework, office work)  2 - Symptomatic, <50% in bed during the day (Ambulatory and capable of all self care but unable to carry out any work activities. Up and about more than 50% of waking hours)  3 - Symptomatic, >50% in bed, but not bedbound (Capable of only limited self-care, confined to bed or chair 50% or more of waking hours)  4 - Bedbound (Completely disabled. Cannot carry on any self-care. Totally confined to bed or chair)  5 - Death   Eustace Pen MM, Creech RH, Tormey DC, et al. [phone]). "Toxicity and response criteria of the [facility]". [place] Oncol. 5 (6): 649-55    LABORATORY DATA:  Lab Results  Component Value Date   WBC 5.2 01/08/2020   HGB 13.0 01/08/2020   HCT 39.1 01/08/2020   MCV 88.3 01/08/2020   PLT 232 01/08/2020   Lab Results  Component Value Date   NA 139 01/08/2020   K 4.0 01/08/2020   CL 101 01/08/2020   CO2 29 01/08/2020   Lab  Results  Component Value Date   ALT 18 01/08/2020   AST 21 01/08/2020   ALKPHOS 79 08/16/2018   BILITOT 0.6 01/08/2020      RADIOGRAPHY: MM Digital Diagnostic Unilat R  Result Date: 05/06/2020 CLINICAL DATA:  Screening recall for right breast calcifications. EXAM: DIGITAL DIAGNOSTIC RIGHT MAMMOGRAM WITH CAD COMPARISON:  Previous exam(s). ACR Breast Density Category c: The breast tissue is heterogeneously dense, which may obscure small masses. FINDINGS: In the slightly upper outer quadrant of the right breast, middle to posterior depth there is a 1.0 cm group of pleomorphic calcifications. Mammographic  images were processed with CAD. IMPRESSION: Suspicious 1.0 cm group of calcifications in the upper-outer right breast. RECOMMENDATION: Stereotactic biopsy is recommended, and has been scheduled for 05/14/2020 at 8:30 a.m. I have discussed the findings and recommendations with the patient. If applicable, a reminder letter will be sent to the patient regarding the next appointment. BI-RADS CATEGORY  4: Suspicious. Electronically Signed   By: Ammie Ferrier M.D.   On: 05/06/2020 12:26   [place] AXILLA RIGHT  Result Date: 05/20/2020 CLINICAL DATA:  Patient presents for evaluation of the RIGHT axilla. Recent stereotactic guided core biopsy of calcifications in the UPPER-OUTER QUADRANT of the RIGHT breast shows ductal carcinoma in situ with foci suspicious for microinvasion. Patient had significant bleeding after stereotactic guided core biopsy and developed post biopsy hematoma. EXAM: ULTRASOUND OF THE RIGHT BREAST COMPARISON:  Previous exam(s). FINDINGS: On physical exam, I palpate no abnormality in the RIGHT axilla. There is significant bruising throughout the RIGHT breast. I palpate a firm mildly tender mass in the UPPER-OUTER QUADRANT of the RIGHT breast measuring at least 7 centimeters in diameter. There is firmness without discrete mass in the [place] of the RIGHT breast. Targeted  ultrasound is performed, showing normal appearing RIGHT axillary lymph nodes. No suspicious lymph nodes or other axillary mass identified. In the UPPER-OUTER QUADRANT of the RIGHT breast at the site of palpable abnormality, there is a large solid heterogeneous hypoechoic mass measuring 3.0 x 2.9 x 2.8 centimeters. On color Doppler evaluation of this lesion, there is brisk internal blood flow, consistent with pseudoaneurysm. The neck of the pseudoaneurysm is adjacent to a venous structure, raising the question of associated AV fistula. The tissue marker clip is not identified at ultrasound. IMPRESSION: 1. No suspicious findings in the RIGHT axilla. 2. Large pseudoaneurysm/hematoma with possible associated AV fistula in the UPPER-OUTER QUADRANT of the RIGHT breast. RECOMMENDATION: Patient is scheduled to see Dr. Georgette Dover for Surgical consultation at the [facility] on 05/22/2020. The findings are discussed with Dr. Georgette Dover. I have discussed the findings and recommendations with the patient. If applicable, a reminder letter will be sent to the patient regarding the next appointment. BI-RADS CATEGORY  6: Known biopsy-proven malignancy. Electronically Signed   By: Nolon Nations M.D.   On: 05/20/2020 15:45   MM CLIP PLACEMENT RIGHT  Result Date: 05/14/2020 CLINICAL DATA:  Postprocedure mammogram for clip placement. EXAM: DIAGNOSTIC RIGHT MAMMOGRAM POST STEREOTACTIC BIOPSY COMPARISON:  Previous exam(s). FINDINGS: Mammographic images were obtained following stereotactic guided biopsy of calcifications in the upper outer right breast. The biopsy marking clip is in expected position at the site of biopsy. IMPRESSION: Appropriate positioning of the coil shaped biopsy marking clip at the site of biopsy in the upper outer right breast. Final Assessment: Post Procedure Mammograms for Marker Placement Electronically Signed   By: Audie Pinto M.D.   On: 05/14/2020 09:33   MM RT BREAST BX W LOC DEV 1ST LESION  IMAGE BX SPEC STEREO GUIDE  Result Date: 05/14/2020 CLINICAL DATA:  59 year old female presenting for biopsy of calcifications in the right breast. EXAM: RIGHT BREAST STEREOTACTIC CORE NEEDLE BIOPSY COMPARISON:  Previous exams. FINDINGS: The patient and I discussed the procedure of stereotactic-guided biopsy including benefits and alternatives. We discussed the high likelihood of a successful procedure. We discussed the risks of the procedure including infection, bleeding, tissue injury, clip migration, and inadequate sampling. Informed written consent was given. The usual time out protocol was performed immediately prior to the procedure. Using sterile technique and 1%  Lidocaine as local anesthetic, under stereotactic guidance, a 9 gauge vacuum assisted device was used to perform core needle biopsy of calcifications in the upper outer right breast using a lateral approach. Specimen radiograph was performed showing at least 5 specimens with calcifications. Specimens with calcifications are identified for pathology. Lesion quadrant: Upper outer quadrant At the conclusion of the procedure, a coil tissue marker clip was deployed into the biopsy cavity. Follow-up 2-view mammogram was performed and dictated separately. The patient had a moderate post biopsy hematoma. A quick clot bandage was applied and bleeding was completely stopped prior to releasing the patient. IMPRESSION: Stereotactic-guided biopsy of calcifications in the upper outer right breast. Moderate post biopsy hematoma. Electronically Signed   By: Audie Pinto M.D.   On: 05/14/2020 09:36       IMPRESSION/PLAN: 1. ER positive high-grade DCIS of the right breast. Dr. Lisbeth Renshaw discusses the pathology findings and reviews the nature of right breast disease. The consensus from the breast conference includes offering lumpectomy sentinel lymph node in a few weeks to allow for her hematoma to resolve.  It was also discussed that she may consider  antiestrogen while awaiting surgery depending on how long this needs to be postponed.  Dr. Lisbeth Renshaw discusses the rationale for adjuvant external radiotherapy to the breast  to reduce risks of local recurrence followed by antiestrogen therapy. We discussed the risks, benefits, short, and long term effects of radiotherapy, as well as the curative intent, and the patient is interested in proceeding. Dr. Lisbeth Renshaw discusses the delivery and logistics of radiotherapy and anticipates a course of 4 weeks of radiotherapy based on her current work-up. We will see her back a few weeks after surgery to discuss the simulation process and anticipate we starting radiotherapy about 4-6 weeks after surgery.  2. GI symptoms and history of colon polyps. I suggested the patient follow up with her GI physician for further triage and or evaluation. She is in agreement with the plan. Given her family history of colon and uterine cancer, and personal history of a uterine growth many years ago, our navigator is going to also reach back out to genetics to see if she qualifies for genetic testing to rule out Lynch Syndrome.  In a visit lasting 60 minutes, greater than 50% of the time was spent face to face reviewing her case, as well as in preparation of, discussing, and coordinating the patient's care.  The above documentation reflects my direct findings during this shared patient visit. Please see the separate note by Dr. Lisbeth Renshaw on this date for the remainder of the patient's plan of care.    Carola Rhine, PAC

## 2020-05-22 NOTE — Patient Instructions (Signed)

## 2020-05-22 NOTE — Therapy (Signed)
[place], [place], [address] Phone: [phone]   Fax:  [phone]  Physical Therapy Evaluation  Patient Details  Name: Sarah Hall MRN: [id number] Date of Birth: [birth year] Referring Provider (PT): Dr. Donnie Mesa   Encounter Date: 05/22/2020   PT End of Session - 05/22/20 1140    Visit Number 1    Number of Visits 2    Date for PT Re-Evaluation 07/17/20    PT Start Time 0926    PT Stop Time 0933   Also saw pt from 1205-1229 for a total of 31 minutes   PT Time Calculation (min) 7 min    Activity Tolerance Patient tolerated treatment well    Behavior During Therapy [facility] for tasks assessed/performed           Past Medical History:  Diagnosis Date   ADHD (attention deficit hyperactivity disorder), inattentive type    Ankle sprain    right ankle   Breast cancer ([place])    Deep breathing    hard to take a deep breath due to thyroid issue   Depression    Bipolar depression, sees Metta Clines NP    Hypothyroidism    cared for by Dr. Ronita Hipps     Past Surgical History:  Procedure Laterality Date   COLONOSCOPY  09/03/2014   per Dr. Fuller Plan, benign polyps, repeat in 10 yrs    DILATION AND EVACUATION      2 times   scraping of uterus     20 years ago   WISDOM TOOTH EXTRACTION      There were no vitals filed for this visit.    Subjective Assessment - 05/22/20 1134    Subjective Patient reports she is here today to be seen by her medical team for her newly diagnosed right breast cancer.    Pertinent History Patient was diagnosed on 05/15/2020 with right DCIS with a foci for microinvasion. It measures 1 cm and is located in the upper outer quadrant. It is ER positive and PR negative.    Patient Stated Goals Reduce lymphedema risk and learn post op shoulder ROM HEP    Currently in Pain? No/denies              [facility] PT Assessment - 05/22/20 0001      Assessment   Medical  Diagnosis Right breast cancer    Referring Provider (PT) Dr. Donnie Mesa    Onset Date/Surgical Date 05/15/20    Hand Dominance Right    Prior Therapy none      Precautions   Precautions Other (comment)    Precaution Comments active cancer      Restrictions   Weight Bearing Restrictions No      Balance Screen   Has the patient fallen in the past 6 months No    Has the patient had a decrease in activity level because of a fear of falling?  No    Is the patient reluctant to leave their home because of a fear of falling?  No      Home Environment   Living Environment Private residence    Living Arrangements Children   74 y.o. son   Available Help at Discharge Family      Prior Function   Level of [place] Retired    Leisure Walks on treadmill 36mn 4x/week, some weights and stretching      Cognition   Overall Cognitive Status Within  Functional Limits for tasks assessed      Posture/Postural Control   Posture/Postural Control Postural limitations    Postural Limitations Rounded Shoulders;Forward head      ROM / Strength   AROM / PROM / Strength AROM;Strength      AROM   AROM Assessment Site Shoulder    Right/Left Shoulder Right;Left    Right Shoulder Extension 56 Degrees    Right Shoulder Flexion 161 Degrees    Right Shoulder ABduction 170 Degrees    Right Shoulder Internal Rotation 66 Degrees    Right Shoulder External Rotation 90 Degrees    Left Shoulder Extension 56 Degrees    Left Shoulder Flexion 152 Degrees    Left Shoulder ABduction 170 Degrees    Left Shoulder Internal Rotation 76 Degrees    Left Shoulder External Rotation 79 Degrees      Strength   Overall Strength Within functional limits for tasks performed             LYMPHEDEMA/ONCOLOGY QUESTIONNAIRE - 05/22/20 0001      Type   Cancer Type Right breast cancer      Lymphedema Assessments   Lymphedema Assessments Upper extremities      Right Upper Extremity  Lymphedema   10 cm Proximal to Olecranon Process 24.9 cm    Olecranon Process 22.5 cm    10 cm Proximal to Ulnar Styloid Process 18.8 cm    Just Proximal to Ulnar Styloid Process 13.9 cm    Across Hand at [employer] 17.1 cm    At [place] of 2nd Digit 5.9 cm      Left Upper Extremity Lymphedema   10 cm Proximal to Olecranon Process 24.6 cm    Olecranon Process 22.5 cm    10 cm Proximal to Ulnar Styloid Process 18.1 cm    Just Proximal to Ulnar Styloid Process 13.9 cm    Across Hand at [employer] 16.4 cm    At [place] of 2nd Digit 5.3 cm           L-DEX FLOWSHEETS - 05/22/20 1100      L-DEX LYMPHEDEMA SCREENING   Measurement Type Unilateral    L-DEX MEASUREMENT EXTREMITY Upper Extremity    POSITION  Standing    DOMINANT SIDE Right    At Risk Side Right    BASELINE SCORE (UNILATERAL) -1.2           The patient was assessed using the L-Dex machine today to produce a lymphedema index baseline score. The patient will be reassessed on a regular basis (typically every 3 months) to obtain new L-Dex scores. If the score is > 6.5 points away from his/her baseline score indicating onset of subclinical lymphedema, it will be recommended to wear a compression garment for 4 weeks, 12 hours per day and then be reassessed. If the score continues to be > 6.5 points from baseline at reassessment, we will initiate lymphedema treatment. Assessing in this manner has a 95% rate of preventing clinically significant lymphedema.      Katina Dung - 05/22/20 0001    Open a tight or new jar No difficulty    Do heavy household chores (wash walls, wash floors) No difficulty    Carry a shopping bag or briefcase No difficulty    Wash your back No difficulty    Use a knife to cut food No difficulty    Recreational activities in which you take some force or impact through your arm, shoulder, or  hand (golf, hammering, tennis) No difficulty    During the past week, to what extent has your arm, shoulder or  hand problem interfered with your normal social activities with family, friends, neighbors, or groups? Not at all    During the past week, to what extent has your arm, shoulder or hand problem limited your work or other regular daily activities Not at all    Arm, shoulder, or hand pain. None    Tingling (pins and needles) in your arm, shoulder, or hand None    Difficulty Sleeping No difficulty    DASH Score 0 %            Objective measurements completed on examination: See above findings.          Patient was instructed today in a home exercise program today for post op shoulder range of motion. These included active assist shoulder flexion in sitting, scapular retraction, wall walking with shoulder abduction, and hands behind head external rotation.  She was encouraged to do these twice a day, holding 3 seconds and repeating 5 times when permitted by her physician.         PT Education - 05/22/20 1140    Education Details Lymphedema risk reduction and post op shoulder ROM HEP    Person(s) Educated Patient    Methods Explanation;Demonstration;Handout    Comprehension Returned demonstration;Verbalized understanding               PT Long Term Goals - 05/22/20 1144      PT LONG TERM GOAL #1   Title Patient will demonstrate she has regained full shoulder ROM and function post operatively compared to baselines.    Time 8    Period Weeks    Status New    Target Date 07/17/20           Breast Clinic Goals - 05/22/20 1144      Patient will be able to verbalize understanding of pertinent lymphedema risk reduction practices relevant to her diagnosis specifically related to skin care.   Time 1    Period Days    Status Achieved      Patient will be able to return demonstrate and/or verbalize understanding of the post-op home exercise program related to regaining shoulder range of motion.   Time 1    Period Days    Status Achieved      Patient will be able to  verbalize understanding of the importance of attending the postoperative After Breast Cancer Class for further lymphedema risk reduction education and therapeutic exercise.   Time 1    Period Days    Status Achieved                 Plan - 05/22/20 1141    Clinical Impression Statement Patient was diagnosed on 05/15/2020 with right DCIS with a foci for microinvasion. It measures 1 cm and is located in the upper outer quadrant. It is ER positive and PR negative. Her multidisciplinary medical team met prior to her assessments to determine a recommended treatment plan. She is planning to have a right lumpectomy and sentinel node biopsy followed by radiation and anti-estrogen therapy. She will benefit from a post op PT reassesment to detemrine needs and from L-Dex screens every 3 months for 2 years to detect subclinical lymphedema.    Stability/Clinical Decision Making Stable/Uncomplicated    Clinical Decision Making Low    Rehab Potential Excellent    PT Frequency --   Eval  and 1 f/u visit   PT Treatment/Interventions ADLs/Self Care Home Management;Therapeutic exercise;Patient/family education    PT Next Visit Plan Will reassess 3-4 weeks post op    PT Home Exercise Plan Post op shoulder ROM HEP    Consulted and Agree with Plan of Care Patient           Patient will benefit from skilled therapeutic intervention in order to improve the following deficits and impairments:  Postural dysfunction, Decreased range of motion, Decreased knowledge of precautions, Impaired UE functional use, Pain  Visit Diagnosis: Malignant neoplasm of upper-outer quadrant of right breast in female, estrogen receptor positive ([place]) - Plan: PT plan of care cert/re-cert  Abnormal posture - Plan: PT plan of care cert/re-cert   Patient will follow up at outpatient cancer rehab 3-4 weeks following surgery.  If the patient requires physical therapy at that time, a specific plan will be dictated and sent to the  referring physician for approval. The patient was educated today on appropriate basic range of motion exercises to begin post operatively and the importance of attending the After Breast Cancer class following surgery.  Patient was educated today on lymphedema risk reduction practices as it pertains to recommendations that will benefit the patient immediately following surgery.  She verbalized good understanding.      Problem List Patient Active Problem List   Diagnosis Date Noted   Ductal carcinoma in situ (DCIS) of right breast 05/20/2020   Acquired hypothyroidism 06/09/2018   Seasonal allergies 06/09/2018   Depression, recurrent ([place]) 06/09/2018   Insomnia 01/15/2015   Low back pain 08/20/2014   Allergic rhinitis 02/22/2014   ADD (attention deficit disorder) 12/31/2011   Perimenopausal vasomotor symptoms 12/31/2011   Fibrocystic breast disease 04/14/2011   Annia Friendly, PT 05/22/20 1:09 PM  [place], [place], [address] Phone: [phone]   Fax:  [phone]  Name: Myah M Hall MRN: [id number] Date of Birth: [birth year]

## 2020-05-22 NOTE — Addendum Note (Signed)
Encounter addended by: Hayden Pedro, PA-C on: 05/22/2020 12:12 PM  Actions taken: Clinical Note Signed

## 2020-05-22 NOTE — H&P (Signed)
History of Present Illness Sarah Hall. Sarah Marcell MD; 05/22/2020 11:13 AM) The patient is a 59 year old female who presents with breast cancer. Breast MDC - 05/22/20 [place]/ Lisbeth Renshaw  This is a 59 year old female who presented with screening detected calcifications in the right breast. A 1 cm group of calcifications was seen in the right upper outer quadrant by diagnostic imaging and a biopsy on 05/14/2020 revealed high-grade DCIS with necrosis and calcifications with focal suspicion for microinvasion. Her cancer was ER positive PR negative. Ultrasound showed no lymphadenopathy in the axilla. Unfortunately, she did develop a 7 cm hematoma and pseudoaneurysm within the breast following her biopsy. The patient states that the bruising is fading and the hematoma has not enlarged this week.   Problem List/Past Medical Sarah Key K. Mariusz Jubb, MD; 05/22/2020 8:43 AM) Sarah Hall CARCINOMA IN SITU (DCIS) OF RIGHT BREAST WITH MICROINVASIVE COMPONENT (D05.11)  Past Surgical History Sarah Slipper, RN; 05/22/2020 7:58 AM) No pertinent past surgical history  Diagnostic Studies History Sarah Slipper, RN; 05/22/2020 7:58 AM) Colonoscopy 5-10 years ago Mammogram within last year Pap Smear 1-5 years ago  Allergies Sarah Key K. Maverick Dieudonne, MD; 05/22/2020 8:43 AM) Codeine Phosphate *ANALGESICS - OPIOID* Cannot take causes anxiousnexx HYDROcodone-Acetaminophen *ANALGESICS - OPIOID* Can not take hydrocodone/Codeine cause anxiousness  Medication History Sarah Key K. Darleen Moffitt, MD; 05/22/2020 8:43 AM) Medications Reconciled Amphetamine-Dextroamphetamine (20MG  Tablet, Oral) Active. Temazepam (30MG  Capsule, Oral) Active. Azelastine HCl (0.1% Solution, Nasal) Active. Levothyroxine Sodium (88MCG Tablet, Oral) Active. Ondansetron HCl (4MG  Tablet, Oral) Active. Estradiol (0.1MG /GM Cream, Vaginal) Active. Multiple Vitamin (1 (one) Oral) Active. Magnesium (30MG  Tablet, Oral) Active.  Social History Sarah Slipper, RN; 05/22/2020  7:58 AM) Alcohol use Moderate alcohol use. Caffeine use Tea. No drug use Tobacco use Never smoker.  Family History Sarah Slipper, RN; 05/22/2020 7:58 AM) Colon Cancer Family Members In General. Depression Mother. Ovarian Cancer Family Members In General. Thyroid problems Sister.  Pregnancy / Birth History Sarah Slipper, RN; 05/22/2020 7:58 AM) Age at menarche 70 years. Age of menopause 36-50 Gravida 3 Length (months) of breastfeeding 3-6 Maternal age 82-40 Para 1  Other Problems Sarah Hall. Sarah Frankie, MD; 05/22/2020 8:43 AM) Back Pain Depression Thyroid Disease     Review of Systems Sarah Slipper RN; 05/22/2020 7:58 AM) General Present- Night Sweats and Weight Loss. Not Present- Appetite Loss, Chills, Fatigue, Fever and Weight Gain. Skin Not Present- Change in Wart/Mole, Dryness, Hives, Jaundice, New Lesions, Non-Healing Wounds, Rash and Ulcer. HEENT Present- Seasonal Allergies and Wears glasses/contact lenses. Not Present- Earache, Hearing Loss, Hoarseness, Nose Bleed, Oral Ulcers, Ringing in the Ears, Sinus Pain, Sore Throat, Visual Disturbances and Yellow Eyes. Respiratory Not Present- Bloody sputum, Chronic Cough, Difficulty Breathing, Snoring and Wheezing. Breast Not Present- Breast Mass, Breast Pain, Nipple Discharge and Skin Changes. Cardiovascular Present- Rapid Heart Rate and Shortness of Breath. Not Present- Chest Pain, Difficulty Breathing Lying Down, Leg Cramps, Palpitations and Swelling of Extremities. Gastrointestinal Not Present- Abdominal Pain, Bloating, Bloody Stool, Change in Bowel Habits, Chronic diarrhea, Constipation, Difficulty Swallowing, Excessive gas, Gets full quickly at meals, Hemorrhoids, Indigestion, Nausea, Rectal Pain and Vomiting. Female Genitourinary Present- Frequency. Not Present- Nocturia, Painful Urination, Pelvic Pain and Urgency. Musculoskeletal Present- Back Pain. Not Present- Joint Pain, Joint Stiffness, Muscle Pain, Muscle Weakness  and Swelling of Extremities. Neurological Not Present- Decreased Memory, Fainting, Headaches, Numbness, Seizures, Tingling, Tremor, Trouble walking and Weakness. Psychiatric Present- Depression. Not Present- Anxiety, Bipolar, Change in Sleep Pattern, Fearful and Frequent crying. Endocrine Not Present- Cold Intolerance, Excessive Hunger, Hair Changes, Heat  Intolerance, Hot flashes and New Diabetes. Hematology Present- Easy Bruising. Not Present- Blood Thinners, Excessive bleeding, Gland problems, HIV and Persistent Infections.   Physical Exam Sarah Key K. Kaegan Hettich MD; 05/22/2020 11:15 AM)  The physical exam findings are as follows: Note:Constitutional: WDWN in NAD, conversant, no obvious deformities; resting comfortably Eyes: Pupils equal, round; sclera anicteric; moist conjunctiva; no lid lag HENT: Oral mucosa moist; good dentition Neck: No masses palpated, trachea midline; no thyromegaly Lungs: CTA bilaterally; normal respiratory effort Breasts: left breast shows no palpable masses; no nipple changes; no axillary lymphadenopathy Right breast shows a large firm palpable hematoma in the RUOQ measuring about 6 cm across with widespread ecchymosis. No nipple retraction or discharge. No axillary lymphadenopathy. The ecchymosis is beginning to fade at the edges CV: Regular rate and rhythm; no murmurs; extremities well-perfused with no edema Abd: +bowel sounds, soft, non-tender, no palpable organomegaly; no palpable hernias Musc: Normal gait; no apparent clubbing or cyanosis in extremities Lymphatic: No palpable cervical or axillary lymphadenopathy Skin: Warm, dry; no sign of jaundice Psychiatric - alert and oriented x 4; calm mood and affect    Assessment & Plan Sarah Key K. Biran Mayberry MD; 05/22/2020 8:42 AM)  Sarah Hall CARCINOMA IN SITU (DCIS) OF RIGHT BREAST WITH MICROINVASIVE COMPONENT (D05.11)  Current Plans Schedule for Surgery - Right radioactive seed localized lumpectomy. The surgical procedure  has been discussed with the patient. Potential risks, benefits, alternative treatments, and expected outcomes have been explained. All of the patient's questions at this time have been answered. The likelihood of reaching the patient's treatment goal is good. The patient understand the proposed surgical procedure and wishes to proceed. Note:Due to the large hematoma/ pseudoaneurysm, we will delay surgery for a few weeks.  Anti-estrogens to begin immediately due to the delay in surgery.  Breast-conserving surgery followed by radiation and anti-estrogens.  Sarah Hall. Georgette Dover, MD, [facility]  General/ Trauma Surgery   05/22/2020 11:16 AM

## 2020-05-22 NOTE — H&P (Signed)
We will also perform right axillary sentinel lymph node biopsy/ blue dye injection at the time of surgery due to the area of microinvasion.  Imogene Burn. Georgette Dover, MD, [facility]  General/ Trauma Surgery   05/22/2020 11:37 AM

## 2020-05-22 NOTE — Assessment & Plan Note (Signed)
05/20/20: Screening mammogram showed right breast calcifications. Diagnostic mammogram showed a 1.0cm group of calcifications in the upper outer right breast. Biopsy showed DCIS, high grade, ER+ 5%, PR- 0%.   Pathology review: I discussed with the patient the difference between DCIS and invasive breast cancer. It is considered a precancerous lesion. DCIS is classified as a 0. It is generally detected through mammograms as calcifications. We discussed the significance of grades and its impact on prognosis. We also discussed the importance of ER and PR receptors and their implications to adjuvant treatment options. Prognosis of DCIS dependence on grade, comedo necrosis. It is anticipated that if not treated, 20-30% of DCIS can develop into invasive breast cancer.  Recommendation: 1. Breast conserving surgery 2. Followed by adjuvant radiation therapy 3. Followed by antiestrogen therapy with tamoxifen 5 years  Tamoxifen counseling: We discussed the risks and benefits of tamoxifen. These include but not limited to insomnia, hot flashes, mood changes, vaginal dryness, and weight gain. Although rare, serious side effects including endometrial cancer, risk of blood clots were also discussed. We strongly believe that the benefits far outweigh the risks. Patient understands these risks and consented to starting treatment. Planned treatment duration is 5 years.  Because of hematoma her surgery will be postponed until January.  Because of that I instructed her to start on tamoxifen 10 mg daily.  I sent a prescription for 30 tablets.  She will stop tamoxifen 1 week prior to surgery.  Return to clinic after surgery to discuss the final pathology report and come up with an adjuvant treatment plan.

## 2020-05-23 ENCOUNTER — Encounter: Payer: Self-pay | Admitting: Licensed Clinical Social Worker

## 2020-05-23 ENCOUNTER — Encounter: Payer: Self-pay | Admitting: *Deleted

## 2020-05-23 NOTE — Progress Notes (Signed)
Clinical Social Work [place] Psychosocial Distress Screening [place]   Patient completed distress screening protocol and attended clinic during yesterday's [facility]. CSW attempted to contact patient by phone today to introduce self and support services and assess for any psychosocial needs.  No answer. Left VM with direct contact information.    Distress Screen: ONCBCN DISTRESS SCREENING 05/23/2020  Screening Type Initial Screening  Family Problem type Children  Emotional problem type Nervousness/Anxiety;Adjusting to illness  Spiritual/Religous concerns type Facing my mortality  Information Concerns Type Lack of info about diagnosis  Physical Problem type Sleep/insomnia;Sarah Hall

## 2020-05-24 ENCOUNTER — Other Ambulatory Visit: Payer: Self-pay | Admitting: Hematology and Oncology

## 2020-05-24 ENCOUNTER — Telehealth: Payer: Self-pay | Admitting: Hematology and Oncology

## 2020-05-24 ENCOUNTER — Other Ambulatory Visit: Payer: Self-pay | Admitting: Surgery

## 2020-05-24 DIAGNOSIS — C50911 Malignant neoplasm of unspecified site of right female breast: Secondary | ICD-10-CM

## 2020-05-24 NOTE — Telephone Encounter (Signed)
No 12/1 los, no changes made to pt schedule

## 2020-05-30 ENCOUNTER — Telehealth: Payer: Self-pay | Admitting: Hematology and Oncology

## 2020-05-30 ENCOUNTER — Encounter: Payer: Self-pay | Admitting: *Deleted

## 2020-05-30 ENCOUNTER — Telehealth: Payer: Self-pay | Admitting: *Deleted

## 2020-05-30 DIAGNOSIS — D0511 Intraductal carcinoma in situ of right breast: Secondary | ICD-10-CM

## 2020-05-30 NOTE — Telephone Encounter (Signed)
Scheduled appt per 12/9 sch msg - pt is aware of apt date and time

## 2020-05-30 NOTE — Telephone Encounter (Signed)
Spoke to pt concerning bmdc from 12.1.21. Denies questions or concerns regarding dx or treatment care plan. Encourage pt to call with needs. Received verbal understanding.

## 2020-06-06 ENCOUNTER — Encounter: Payer: Self-pay | Admitting: *Deleted

## 2020-06-25 ENCOUNTER — Encounter (HOSPITAL_BASED_OUTPATIENT_CLINIC_OR_DEPARTMENT_OTHER): Payer: Self-pay | Admitting: Surgery

## 2020-06-25 ENCOUNTER — Other Ambulatory Visit: Payer: Self-pay

## 2020-06-28 ENCOUNTER — Other Ambulatory Visit (HOSPITAL_COMMUNITY)
Admission: RE | Admit: 2020-06-28 | Discharge: 2020-06-28 | Disposition: A | Discharge status: Home / Self Care | Payer: 59 | Source: Ambulatory Visit | Attending: Surgery | Admitting: Surgery

## 2020-06-28 DIAGNOSIS — Z20822 Contact with and (suspected) exposure to covid-19: Secondary | ICD-10-CM | POA: Insufficient documentation

## 2020-06-28 DIAGNOSIS — Z01812 Encounter for preprocedural laboratory examination: Secondary | ICD-10-CM | POA: Insufficient documentation

## 2020-06-28 LAB — SARS CORONAVIRUS 2 (TAT 6-24 HRS): SARS Coronavirus 2: NEGATIVE

## 2020-07-01 ENCOUNTER — Other Ambulatory Visit: Payer: Self-pay

## 2020-07-01 ENCOUNTER — Ambulatory Visit
Admission: RE | Admit: 2020-07-01 | Discharge: 2020-07-01 | Disposition: A | Discharge status: Home / Self Care | Payer: 59 | Source: Ambulatory Visit | Attending: Surgery | Admitting: Surgery

## 2020-07-01 DIAGNOSIS — C50911 Malignant neoplasm of unspecified site of right female breast: Secondary | ICD-10-CM

## 2020-07-01 DIAGNOSIS — D0511 Intraductal carcinoma in situ of right breast: Secondary | ICD-10-CM

## 2020-07-01 NOTE — Progress Notes (Signed)

## 2020-07-02 ENCOUNTER — Ambulatory Visit
Admission: RE | Admit: 2020-07-02 | Discharge: 2020-07-02 | Disposition: A | Discharge status: Home / Self Care | Payer: 59 | Source: Ambulatory Visit | Attending: Surgery | Admitting: Surgery

## 2020-07-02 ENCOUNTER — Ambulatory Visit (HOSPITAL_BASED_OUTPATIENT_CLINIC_OR_DEPARTMENT_OTHER): Payer: 59 | Admitting: Anesthesiology

## 2020-07-02 ENCOUNTER — Other Ambulatory Visit: Payer: Self-pay

## 2020-07-02 ENCOUNTER — Ambulatory Visit (HOSPITAL_BASED_OUTPATIENT_CLINIC_OR_DEPARTMENT_OTHER)
Admission: RE | Admit: 2020-07-02 | Discharge: 2020-07-02 | Disposition: A | Discharge status: Home / Self Care | Payer: 59 | Source: Ambulatory Visit | Attending: Surgery | Admitting: Surgery

## 2020-07-02 ENCOUNTER — Encounter (HOSPITAL_COMMUNITY)
Admission: RE | Admit: 2020-07-02 | Discharge: 2020-07-02 | Disposition: A | Discharge status: Home / Self Care | Payer: 59 | Source: Ambulatory Visit | Attending: Surgery | Admitting: Surgery

## 2020-07-02 ENCOUNTER — Encounter (HOSPITAL_BASED_OUTPATIENT_CLINIC_OR_DEPARTMENT_OTHER): Payer: Self-pay | Admitting: Surgery

## 2020-07-02 ENCOUNTER — Encounter (HOSPITAL_BASED_OUTPATIENT_CLINIC_OR_DEPARTMENT_OTHER)
Admission: RE | Disposition: A | Discharge status: Home / Self Care | Payer: Self-pay | Source: Ambulatory Visit | Attending: Surgery

## 2020-07-02 DIAGNOSIS — Z885 Allergy status to narcotic agent status: Secondary | ICD-10-CM | POA: Insufficient documentation

## 2020-07-02 DIAGNOSIS — C50911 Malignant neoplasm of unspecified site of right female breast: Secondary | ICD-10-CM

## 2020-07-02 DIAGNOSIS — Z886 Allergy status to analgesic agent status: Secondary | ICD-10-CM | POA: Insufficient documentation

## 2020-07-02 DIAGNOSIS — D0511 Intraductal carcinoma in situ of right breast: Secondary | ICD-10-CM | POA: Diagnosis present

## 2020-07-02 DIAGNOSIS — Z800 Family history of malignant neoplasm of digestive organs: Secondary | ICD-10-CM | POA: Diagnosis not present

## 2020-07-02 HISTORY — PX: BREAST LUMPECTOMY: SHX2

## 2020-07-02 HISTORY — PX: BREAST LUMPECTOMY WITH RADIOACTIVE SEED AND SENTINEL LYMPH NODE BIOPSY: SHX6550

## 2020-07-02 SURGERY — BREAST LUMPECTOMY WITH RADIOACTIVE SEED AND SENTINEL LYMPH NODE BIOPSY
Anesthesia: Regional | Site: Breast | Laterality: Right

## 2020-07-02 MED ORDER — PROPOFOL 10 MG/ML IV BOLUS
INTRAVENOUS | Status: DC | PRN
Start: 2020-07-02 — End: 2020-07-02
  Administered 2020-07-02: 130 mg via INTRAVENOUS

## 2020-07-02 MED ORDER — DEXAMETHASONE SODIUM PHOSPHATE 4 MG/ML IJ SOLN
INTRAMUSCULAR | Status: DC | PRN
  Administered 2020-07-02 (×2): 5 mg via PERINEURAL

## 2020-07-02 MED ORDER — HEPARIN (PORCINE) IN NACL 1000-0.9 UT/500ML-% IV SOLN
INTRAVENOUS | Status: AC
  Filled 2020-07-02: qty 500

## 2020-07-02 MED ORDER — DEXAMETHASONE SODIUM PHOSPHATE 10 MG/ML IJ SOLN
INTRAMUSCULAR | Status: AC
  Filled 2020-07-02: qty 1

## 2020-07-02 MED ORDER — CEFAZOLIN SODIUM-DEXTROSE 2-4 GM/100ML-% IV SOLN
2.0000 g | INTRAVENOUS | Status: AC
  Administered 2020-07-02: 2 g via INTRAVENOUS

## 2020-07-02 MED ORDER — TECHNETIUM TC 99M TETROFOSMIN IV KIT: 10.0000 | PACK | Freq: Once | INTRAVENOUS | Status: DC | PRN

## 2020-07-02 MED ORDER — HYDROCODONE-ACETAMINOPHEN 5-325 MG PO TABS: 1.0000 | ORAL_TABLET | Freq: Four times a day (QID) | ORAL | 0 refills | Status: DC | PRN

## 2020-07-02 MED ORDER — FENTANYL CITRATE (PF) 100 MCG/2ML IJ SOLN
INTRAMUSCULAR | Status: AC
  Filled 2020-07-02: qty 2

## 2020-07-02 MED ORDER — LACTATED RINGERS IV SOLN: INTRAVENOUS | Status: DC

## 2020-07-02 MED ORDER — ROPIVACAINE HCL 5 MG/ML IJ SOLN
INTRAMUSCULAR | Status: DC | PRN
  Administered 2020-07-02: 25 mL via PERINEURAL

## 2020-07-02 MED ORDER — EPHEDRINE SULFATE 50 MG/ML IJ SOLN
INTRAMUSCULAR | Status: DC | PRN
  Administered 2020-07-02 (×2): 10 mg via INTRAVENOUS

## 2020-07-02 MED ORDER — ACETAMINOPHEN 500 MG PO TABS
1000.0000 mg | ORAL_TABLET | ORAL | Status: AC
  Administered 2020-07-02: 1000 mg via ORAL

## 2020-07-02 MED ORDER — ONDANSETRON HCL 4 MG/2ML IJ SOLN: 4.0000 mg | Freq: Once | INTRAMUSCULAR | Status: DC | PRN

## 2020-07-02 MED ORDER — LIDOCAINE 2% (20 MG/ML) 5 ML SYRINGE
INTRAMUSCULAR | Status: DC | PRN
  Administered 2020-07-02: 60 mg via INTRAVENOUS

## 2020-07-02 MED ORDER — METHYLENE BLUE 0.5 % INJ SOLN
INTRAVENOUS | Status: AC
  Filled 2020-07-02: qty 10

## 2020-07-02 MED ORDER — FENTANYL CITRATE (PF) 100 MCG/2ML IJ SOLN
INTRAMUSCULAR | Status: DC | PRN
  Administered 2020-07-02 (×2): 25 ug via INTRAVENOUS
  Administered 2020-07-02: 50 ug via INTRAVENOUS

## 2020-07-02 MED ORDER — FENTANYL CITRATE (PF) 100 MCG/2ML IJ SOLN: 25.0000 ug | INTRAMUSCULAR | Status: DC | PRN

## 2020-07-02 MED ORDER — ONDANSETRON HCL 4 MG/2ML IJ SOLN
INTRAMUSCULAR | Status: AC
  Filled 2020-07-02: qty 2

## 2020-07-02 MED ORDER — SODIUM CHLORIDE (PF) 0.9 % IJ SOLN
INTRAMUSCULAR | Status: AC
  Filled 2020-07-02: qty 10

## 2020-07-02 MED ORDER — ACETAMINOPHEN 500 MG PO TABS
ORAL_TABLET | ORAL | Status: AC
  Filled 2020-07-02: qty 2

## 2020-07-02 MED ORDER — GABAPENTIN 300 MG PO CAPS
300.0000 mg | ORAL_CAPSULE | ORAL | Status: AC
  Administered 2020-07-02: 300 mg via ORAL

## 2020-07-02 MED ORDER — GABAPENTIN 300 MG PO CAPS
ORAL_CAPSULE | ORAL | Status: AC
  Filled 2020-07-02: qty 1

## 2020-07-02 MED ORDER — CHLORHEXIDINE GLUCONATE CLOTH 2 % EX PADS: 6.0000 | MEDICATED_PAD | Freq: Once | CUTANEOUS | Status: DC

## 2020-07-02 MED ORDER — CEFAZOLIN SODIUM-DEXTROSE 2-4 GM/100ML-% IV SOLN
INTRAVENOUS | Status: AC
  Filled 2020-07-02: qty 100

## 2020-07-02 MED ORDER — TECHNETIUM TC 99M TILMANOCEPT KIT
1.0000 | PACK | Freq: Once | INTRAVENOUS | Status: AC | PRN
  Administered 2020-07-02: 1 via INTRADERMAL

## 2020-07-02 MED ORDER — AMISULPRIDE (ANTIEMETIC) 5 MG/2ML IV SOLN: 10.0000 mg | Freq: Once | INTRAVENOUS | Status: DC | PRN

## 2020-07-02 MED ORDER — SODIUM CHLORIDE (PF) 0.9 % IJ SOLN
INTRAVENOUS | Status: DC | PRN
  Administered 2020-07-02: 5 mL via INTRAMUSCULAR

## 2020-07-02 MED ORDER — MIDAZOLAM HCL 2 MG/2ML IJ SOLN
2.0000 mg | Freq: Once | INTRAMUSCULAR | Status: AC
  Administered 2020-07-02: 2 mg via INTRAVENOUS

## 2020-07-02 MED ORDER — MIDAZOLAM HCL 2 MG/2ML IJ SOLN
INTRAMUSCULAR | Status: AC
  Filled 2020-07-02: qty 2

## 2020-07-02 MED ORDER — HEPARIN SOD (PORK) LOCK FLUSH 100 UNIT/ML IV SOLN
INTRAVENOUS | Status: AC
  Filled 2020-07-02: qty 5

## 2020-07-02 MED ORDER — BUPIVACAINE-EPINEPHRINE 0.25% -1:200000 IJ SOLN
INTRAMUSCULAR | Status: DC | PRN
  Administered 2020-07-02: 20 mL

## 2020-07-02 MED ORDER — ONDANSETRON HCL 4 MG/2ML IJ SOLN
INTRAMUSCULAR | Status: DC | PRN
  Administered 2020-07-02: 4 mg via INTRAVENOUS

## 2020-07-02 MED ORDER — FENTANYL CITRATE (PF) 100 MCG/2ML IJ SOLN
100.0000 ug | Freq: Once | INTRAMUSCULAR | Status: AC
  Administered 2020-07-02: 100 ug via INTRAVENOUS

## 2020-07-02 SURGICAL SUPPLY — 52 items
APL PRP STRL LF DISP 70% ISPRP (MISCELLANEOUS) ×1
APL SKNCLS STERI-STRIP NONHPOA (GAUZE/BANDAGES/DRESSINGS) ×1
APPLIER CLIP 9.375 MED OPEN (MISCELLANEOUS) ×2
APR CLP MED 9.3 20 MLT OPN (MISCELLANEOUS) ×1
BENZOIN TINCTURE PRP APPL 2/3 (GAUZE/BANDAGES/DRESSINGS) ×2 IMPLANT
BLADE CLIPPER SURG (BLADE) IMPLANT
BLADE HEX COATED 2.75 (ELECTRODE) ×2 IMPLANT
BLADE SURG 10 STRL SS (BLADE) IMPLANT
BLADE SURG 15 STRL LF DISP TIS (BLADE) ×1 IMPLANT
BLADE SURG 15 STRL SS (BLADE) ×2
CANISTER SUC SOCK COL 7IN (MISCELLANEOUS) IMPLANT
CANISTER SUCT 1200ML W/VALVE (MISCELLANEOUS) IMPLANT
CHLORAPREP W/TINT 26 (MISCELLANEOUS) ×2 IMPLANT
CLIP APPLIE 9.375 MED OPEN (MISCELLANEOUS) ×1 IMPLANT
COVER BACK TABLE 60X90IN (DRAPES) ×2 IMPLANT
COVER MAYO STAND STRL (DRAPES) ×2 IMPLANT
COVER PROBE W GEL 5X96 (DRAPES) ×2 IMPLANT
COVER WAND RF STERILE (DRAPES) IMPLANT
DECANTER SPIKE VIAL GLASS SM (MISCELLANEOUS) IMPLANT
DRAPE LAPAROSCOPIC ABDOMINAL (DRAPES) ×2 IMPLANT
DRAPE UTILITY XL STRL (DRAPES) ×2 IMPLANT
DRSG TEGADERM 4X4.75 (GAUZE/BANDAGES/DRESSINGS) ×4 IMPLANT
ELECT REM PT RETURN 9FT ADLT (ELECTROSURGICAL) ×2
ELECTRODE REM PT RTRN 9FT ADLT (ELECTROSURGICAL) ×1 IMPLANT
GAUZE SPONGE 4X4 12PLY STRL LF (GAUZE/BANDAGES/DRESSINGS) IMPLANT
GLOVE SURG ENC MOIS LTX SZ7 (GLOVE) ×2 IMPLANT
GLOVE SURG UNDER POLY LF SZ7.5 (GLOVE) ×2 IMPLANT
GOWN STRL REUS W/ TWL LRG LVL3 (GOWN DISPOSABLE) ×2 IMPLANT
GOWN STRL REUS W/TWL LRG LVL3 (GOWN DISPOSABLE) ×4
ILLUMINATOR WAVEGUIDE N/F (MISCELLANEOUS) IMPLANT
KIT MARKER MARGIN INK (KITS) ×2 IMPLANT
LIGHT WAVEGUIDE WIDE FLAT (MISCELLANEOUS) IMPLANT
NDL SAFETY ECLIPSE 18X1.5 (NEEDLE) ×1 IMPLANT
NEEDLE HYPO 18GX1.5 SHARP (NEEDLE) ×2
NEEDLE HYPO 25X1 1.5 SAFETY (NEEDLE) ×4 IMPLANT
NS IRRIG 1000ML POUR BTL (IV SOLUTION) ×2 IMPLANT
PACK BASIN DAY SURGERY FS (CUSTOM PROCEDURE TRAY) ×2 IMPLANT
PENCIL SMOKE EVACUATOR (MISCELLANEOUS) ×2 IMPLANT
SLEEVE SCD COMPRESS KNEE MED (MISCELLANEOUS) ×2 IMPLANT
SPONGE LAP 18X18 RF (DISPOSABLE) IMPLANT
SPONGE LAP 4X18 RFD (DISPOSABLE) ×2 IMPLANT
STRIP CLOSURE SKIN 1/2X4 (GAUZE/BANDAGES/DRESSINGS) ×2 IMPLANT
SUT MON AB 4-0 PC3 18 (SUTURE) ×4 IMPLANT
SUT SILK 2 0 SH (SUTURE) IMPLANT
SUT VIC AB 3-0 SH 27 (SUTURE) ×4
SUT VIC AB 3-0 SH 27X BRD (SUTURE) ×2 IMPLANT
SYR BULB EAR ULCER 3OZ GRN STR (SYRINGE) ×2 IMPLANT
SYR CONTROL 10ML LL (SYRINGE) ×4 IMPLANT
TOWEL GREEN STERILE FF (TOWEL DISPOSABLE) ×2 IMPLANT
TRAY FAXITRON CT DISP (TRAY / TRAY PROCEDURE) ×2 IMPLANT
TUBE CONNECTING 20X1/4 (TUBING) ×2 IMPLANT
YANKAUER SUCT BULB TIP NO VENT (SUCTIONS) IMPLANT

## 2020-07-02 NOTE — Anesthesia Postprocedure Evaluation (Signed)
Anesthesia Post Note  Patient: Sarah Hall  Procedure(s) Performed: RIGHT BREAST LUMPECTOMY WITH RADIOACTIVE SEED AND RIGHT AXILLARY SENTINEL LYMPH NODE BIOPSY, BLUE DYE INJECTION (Right Breast)     Patient location during evaluation: PACU Anesthesia Type: Regional and General Level of consciousness: awake and alert Pain management: pain level controlled Vital Signs Assessment: post-procedure vital signs reviewed and stable Respiratory status: spontaneous breathing, nonlabored ventilation, respiratory function stable and patient connected to nasal cannula oxygen Cardiovascular status: blood pressure returned to baseline and stable Postop Assessment: no apparent nausea or vomiting Anesthetic complications: no   No complications documented.  Last Vitals:  Vitals:   07/02/20 1223 07/02/20 1246  BP: 136/77 (!) 146/85  Pulse: 81 71  Resp: 16 16  Temp:  36.4 C  SpO2: 99% 98%    Last Pain:  Vitals:   07/02/20 1246  TempSrc:   PainSc: 0-No pain                 Belenda Cruise P Marycarmen Hagey

## 2020-07-02 NOTE — Anesthesia Procedure Notes (Signed)
Procedure Name: LMA Insertion Date/Time: 07/02/2020 10:55 AM Performed by: Maryella Shivers, CRNA Pre-anesthesia Checklist: Patient identified, Emergency Drugs available, Suction available and Patient being monitored Patient Re-evaluated:Patient Re-evaluated prior to induction Oxygen Delivery Method: Circle system utilized Preoxygenation: Pre-oxygenation with 100% oxygen Induction Type: IV induction Ventilation: Mask ventilation without difficulty LMA: LMA inserted LMA Size: 4.0 Number of attempts: 1 Airway Equipment and Method: Bite block Placement Confirmation: positive ETCO2 Tube secured with: Tape Dental Injury: Teeth and Oropharynx as per pre-operative assessment

## 2020-07-02 NOTE — Transfer of Care (Signed)
Immediate Anesthesia Transfer of Care Note  Patient: Sarah Hall  Procedure(s) Performed: RIGHT BREAST LUMPECTOMY WITH RADIOACTIVE SEED AND RIGHT AXILLARY SENTINEL LYMPH NODE BIOPSY, BLUE DYE INJECTION (Right Breast)  Patient Location: PACU  Anesthesia Type:General  Level of Consciousness: awake, alert  and oriented  Airway & Oxygen Therapy: Patient Spontanous Breathing and Patient connected to nasal cannula oxygen  Post-op Assessment: Report given to RN and Post -op Vital signs reviewed and stable  Post vital signs: Reviewed and stable  Last Vitals:  Vitals Value Taken Time  BP    Temp    Pulse 84 07/02/20 1217  Resp 13 07/02/20 1217  SpO2 100 % 07/02/20 1217  Vitals shown include unvalidated device data.  Last Pain:  Vitals:   07/02/20 0927  TempSrc: Oral  PainSc: 0-No pain      Patients Stated Pain Goal: 4 (48/54/62 7035)  Complications: No complications documented.

## 2020-07-02 NOTE — Anesthesia Preprocedure Evaluation (Signed)
Anesthesia Evaluation  Patient identified by MRN, date of birth, ID band Patient awake    Reviewed: NPO status , Patient's Chart, lab work & pertinent test results  Airway Mallampati: II  TM Distance: >3 FB Neck ROM: Full    Dental  (+) Teeth Intact   Pulmonary neg pulmonary ROS,    Pulmonary exam normal        Cardiovascular negative cardio ROS   Rhythm:Regular Rate:Normal     Neuro/Psych Depression Bipolar Disorder negative neurological ROS     GI/Hepatic Neg liver ROS,   Endo/Other  Hypothyroidism   Renal/GU negative Renal ROS  negative genitourinary   Musculoskeletal IDC right breast   Abdominal (+)  Abdomen: soft. Bowel sounds: normal.  Peds  (+) ADHD Hematology negative hematology ROS (+)   Anesthesia Other Findings   Reproductive/Obstetrics                           Anesthesia Physical Anesthesia Plan  ASA: II  Anesthesia Plan: General and Regional   Post-op Pain Management:  Regional for Post-op pain   Induction: Intravenous  PONV Risk Score and Plan: 3 and Ondansetron, Dexamethasone, Midazolam and Treatment may vary due to age or medical condition  Airway Management Planned: Mask and LMA  Additional Equipment: None  Intra-op Plan:   Post-operative Plan: Extubation in OR  Informed Consent: I have reviewed the patients History and Physical, chart, labs and discussed the procedure including the risks, benefits and alternatives for the proposed anesthesia with the patient or authorized representative who has indicated his/her understanding and acceptance.     Dental advisory given  Plan Discussed with: CRNA  Anesthesia Plan Comments:         Anesthesia Quick Evaluation

## 2020-07-02 NOTE — Discharge Instructions (Addendum)
[place] Office Phone Number [phone]  BREAST BIOPSY/ PARTIAL MASTECTOMY: POST OP INSTRUCTIONS  Always review your discharge instruction sheet given to you by the facility where your surgery was performed.  IF YOU HAVE DISABILITY OR FAMILY LEAVE FORMS, YOU MUST BRING THEM TO THE OFFICE FOR PROCESSING.  DO NOT GIVE THEM TO YOUR DOCTOR.  1. A prescription for pain medication may be given to you upon discharge.  Take your pain medication as prescribed, if needed.  If narcotic pain medicine is not needed, then you may take acetaminophen (Tylenol) or ibuprofen (Advil) as needed. 2. Take your usually prescribed medications unless otherwise directed 3. If you need a refill on your pain medication, please contact your pharmacy.  They will contact our office to request authorization.  Prescriptions will not be filled after 5pm or on week-ends. 4. You should eat very light the first 24 hours after surgery, such as soup, crackers, pudding, etc.  Resume your normal diet the day after surgery. 5. Most patients will experience some swelling and bruising in the breast.  Ice packs and a good support bra will help.  Swelling and bruising can take several days to resolve.  6. It is common to experience some constipation if taking pain medication after surgery.  Increasing fluid intake and taking a stool softener will usually help or prevent this problem from occurring.  A mild laxative (Milk of Magnesia or Miralax) should be taken according to package directions if there are no bowel movements after 48 hours. 7. Unless discharge instructions indicate otherwise, you may remove your bandages 24-48 hours after surgery, and you may shower at that time.  You may have steri-strips (small skin tapes) in place directly over the incision.  These strips should be left on the skin for 7-10 days.  If your surgeon used skin glue on the incision, you may shower in 24 hours.  The glue will flake off over the  next 2-3 weeks.  Any sutures or staples will be removed at the office during your follow-up visit. 8. ACTIVITIES:  You may resume regular daily activities (gradually increasing) beginning the next day.  Wearing a good support bra or sports bra minimizes pain and swelling.  You may have sexual intercourse when it is comfortable. a. You may drive when you no longer are taking prescription pain medication, you can comfortably wear a seatbelt, and you can safely maneuver your car and apply brakes. b. RETURN TO WORK:  ______________________________________________________________________________________ 9. You should see your doctor in the office for a follow-up appointment approximately two weeks after your surgery.  Your doctor's nurse will typically make your follow-up appointment when she calls you with your pathology report.  Expect your pathology report 2-3 business days after your surgery.  You may call to check if you do not hear from [place] after three days. 10. OTHER INSTRUCTIONS: _______________________________________________________________________________________________ _____________________________________________________________________________________________________________________________________ _____________________________________________________________________________________________________________________________________ _____________________________________________________________________________________________________________________________________  WHEN TO CALL YOUR DOCTOR: 1. Fever over 101.0 2. Nausea and/or vomiting. 3. Extreme swelling or bruising. 4. Continued bleeding from incision. 5. Increased pain, redness, or drainage from the incision.  The clinic staff is available to answer your questions during regular business hours.  Please don't hesitate to call and ask to speak to one of the nurses for clinical concerns.  If you have a medical emergency, go to the nearest  emergency room or call 911.  A surgeon from [facility] is always on call at the hospital.  For further questions, please visit [email or web address]  May take Tylenol after 3:30pm, if needed.    Post Anesthesia Home Care Instructions  Activity: Get plenty of rest for the remainder of the day. A responsible individual must stay with you for 24 hours following the procedure.  For the next 24 hours, DO NOT: -Drive a car -Paediatric nurse -Drink alcoholic beverages -Take any medication unless instructed by your physician -Make any legal decisions or sign important papers.  Meals: Start with liquid foods such as gelatin or soup. Progress to regular foods as tolerated. Avoid greasy, spicy, heavy foods. If nausea and/or vomiting occur, drink only clear liquids until the nausea and/or vomiting subsides. Call your physician if vomiting continues.  Special Instructions/Symptoms: Your throat may feel dry or sore from the anesthesia or the breathing tube placed in your throat during surgery. If this causes discomfort, gargle with warm salt water. The discomfort should disappear within 24 hours.  If you had a scopolamine patch placed behind your ear for the management of post- operative nausea and/or vomiting:  1. The medication in the patch is effective for 72 hours, after which it should be removed.  Wrap patch in a tissue and discard in the trash. Wash hands thoroughly with soap and water. 2. You may remove the patch earlier than 72 hours if you experience unpleasant side effects which may include dry mouth, dizziness or visual disturbances. 3. Avoid touching the patch. Wash your hands with soap and water after contact with the patch.    Regional Anesthesia Blocks  1. Numbness or the inability to move the "blocked" extremity may last from 3-48 hours after placement. The length of time depends on the medication injected and your individual response to the medication. If the  numbness is not going away after 48 hours, call your surgeon.  2. The extremity that is blocked will need to be protected until the numbness is gone and the  Strength has returned. Because you cannot feel it, you will need to take extra care to avoid injury. Because it may be weak, you may have difficulty moving it or using it. You may not know what position it is in without looking at it while the block is in effect.  3. For blocks in the legs and feet, returning to weight bearing and walking needs to be done carefully. You will need to wait until the numbness is entirely gone and the strength has returned. You should be able to move your leg and foot normally before you try and bear weight or walk. You will need someone to be with you when you first try to ensure you do not fall and possibly risk injury.  4. Bruising and tenderness at the needle site are common side effects and will resolve in a few days.  5. Persistent numbness or new problems with movement should be communicated to the surgeon or the [place] [phone] [place] [phone]).

## 2020-07-02 NOTE — H&P (View-Only) (Signed)
History of Present Illness The patient is a 59 year old female who presents with breast cancer. Breast MDC - 05/22/20 G[facility]/ [facility]  This is a 59 year old female who presented with screening detected calcifications in the right breast. A 1 cm group of calcifications was seen in the right upper outer quadrant by diagnostic imaging and a biopsy on 05/14/2020 revealed high-grade DCIS with necrosis and calcifications with focal suspicion for microinvasion. Her cancer was ER positive PR negative. Ultrasound showed no lymphadenopathy in the axilla. Unfortunately, she did develop a 7 cm hematoma and pseudoaneurysm within the breast following her biopsy. The patient states that the bruising is fading and the hematoma has not enlarged this week.   Problem List/Past Medical  DUCTAL CARCINOMA IN SITU (DCIS) OF RIGHT BREAST WITH MICROINVASIVE COMPONENT (D05.11)  Past Surgical History  No pertinent past surgical history  Diagnostic Studies History Colonoscopy 5-10 years ago Mammogram within last year Pap Smear 1-5 years ago  Allergies  Codeine Phosphate *ANALGESICS - OPIOID* Cannot take causes anxiousnexx HYDROcodone-Acetaminophen *ANALGESICS - OPIOID* Can not take hydrocodone/Codeine cause anxiousness  Medication History  Medications Reconciled Amphetamine-Dextroamphetamine (20MG Tablet, Oral) Active. Temazepam (30MG Capsule, Oral) Active. Azelastine HCl (0.1% Solution, Nasal) Active. Levothyroxine Sodium (88MCG Tablet, Oral) Active. Ondansetron HCl (4MG Tablet, Oral) Active. Estradiol (0.1MG/GM Cream, Vaginal) Active. Multiple Vitamin (1 (one) Oral) Active. Magnesium (30MG Tablet, Oral) Active.  Social History Alcohol use Moderate alcohol use. Caffeine use Tea. No drug use Tobacco use Never smoker.  Family History Colon Cancer Family Members In General. Depression Mother. Ovarian Cancer Family Members In General. Thyroid problems  Sister.  Pregnancy / Birth History Age at menarche 18 years. Age of menopause 46-50 Gravida 3 Length (months) of breastfeeding 3-6 Maternal age 36-40 Para 1  Other Problems  Back Pain Depression Thyroid Disease     Review of Systems  General Present- Night Sweats and Weight Loss. Not Present- Appetite Loss, Chills, Fatigue, Fever and Weight Gain. Skin Not Present- Change in Wart/Mole, Dryness, Hives, Jaundice, New Lesions, Non-Healing Wounds, Rash and Ulcer. HEENT Present- Seasonal Allergies and Wears glasses/contact lenses. Not Present- Earache, Hearing Loss, Hoarseness, Nose Bleed, Oral Ulcers, Ringing in the Ears, Sinus Pain, Sore Throat, Visual Disturbances and Yellow Eyes. Respiratory Not Present- Bloody sputum, Chronic Cough, Difficulty Breathing, Snoring and Wheezing. Breast Not Present- Breast Mass, Breast Pain, Nipple Discharge and Skin Changes. Cardiovascular Present- Rapid Heart Rate and Shortness of Breath. Not Present- Chest Pain, Difficulty Breathing Lying Down, Leg Cramps, Palpitations and Swelling of Extremities. Gastrointestinal Not Present- Abdominal Pain, Bloating, Bloody Stool, Change in Bowel Habits, Chronic diarrhea, Constipation, Difficulty Swallowing, Excessive gas, Gets full quickly at meals, Hemorrhoids, Indigestion, Nausea, Rectal Pain and Vomiting. Female Genitourinary Present- Frequency. Not Present- Nocturia, Painful Urination, Pelvic Pain and Urgency. Musculoskeletal Present- Back Pain. Not Present- Joint Pain, Joint Stiffness, Muscle Pain, Muscle Weakness and Swelling of Extremities. Neurological Not Present- Decreased Memory, Fainting, Headaches, Numbness, Seizures, Tingling, Tremor, Trouble walking and Weakness. Psychiatric Present- Depression. Not Present- Anxiety, Bipolar, Change in Sleep Pattern, Fearful and Frequent crying. Endocrine Not Present- Cold Intolerance, Excessive Hunger, Hair Changes, Heat Intolerance, Hot flashes and New  Diabetes. Hematology Present- Easy Bruising. Not Present- Blood Thinners, Excessive bleeding, Gland problems, HIV and Persistent Infections.   Physical Exam   The physical exam findings are as follows: Note:Constitutional: WDWN in NAD, conversant, no obvious deformities; resting comfortably Eyes: Pupils equal, round; sclera anicteric; moist conjunctiva; no lid lag HENT: Oral mucosa moist; good dentition Neck: No masses   palpated, trachea midline; no thyromegaly Lungs: CTA bilaterally; normal respiratory effort Breasts: left breast shows no palpable masses; no nipple changes; no axillary lymphadenopathy Right breast shows a large firm palpable hematoma in the RUOQ measuring about 6 cm across with widespread ecchymosis. No nipple retraction or discharge. No axillary lymphadenopathy. The ecchymosis is beginning to fade at the edges CV: Regular rate and rhythm; no murmurs; extremities well-perfused with no edema Abd: +bowel sounds, soft, non-tender, no palpable organomegaly; no palpable hernias Musc: Normal gait; no apparent clubbing or cyanosis in extremities Lymphatic: No palpable cervical or axillary lymphadenopathy Skin: Warm, dry; no sign of jaundice Psychiatric - alert and oriented x 4; calm mood and affect    Assessment & Plan  DUCTAL CARCINOMA IN SITU (DCIS) OF RIGHT BREAST WITH MICROINVASIVE COMPONENT (D05.11)  Current Plans Schedule for Surgery - Right radioactive seed localized lumpectomy. The surgical procedure has been discussed with the patient. Potential risks, benefits, alternative treatments, and expected outcomes have been explained. All of the patient's questions at this time have been answered. The likelihood of reaching the patient's treatment goal is good. The patient understand the proposed surgical procedure and wishes to proceed. Note:Due to the large hematoma/ pseudoaneurysm, we will delay surgery for a few weeks.  Anti-estrogens to begin immediately  due to the delay in surgery.  We will also perform right axillary sentinel lymph node biopsy/ blue dye injection at the time of surgery due to the area of microinvasion  Breast-conserving surgery followed by radiation and anti-estrogens.  Imogene Burn. Georgette Dover, MD, [facility]  General/ Trauma Surgery   07/02/2020 10:39 AM

## 2020-07-02 NOTE — Anesthesia Procedure Notes (Signed)
Anesthesia Regional Block: Pectoralis block   Pre-Anesthetic Checklist: ,, timeout performed, Correct Patient, Correct Site, Correct Laterality, Correct Procedure, Correct Position, site marked, Risks and benefits discussed,  Surgical consent,  Pre-op evaluation,  At surgeon's request and post-op pain management  Laterality: Right  Prep: Dura Prep       Needles:  Injection technique: Single-shot  Needle Type: Echogenic Stimulator Needle     Needle Length: 5cm  Needle Gauge: 20     Additional Needles:   Procedures:,,,, ultrasound used (permanent image in chart),,,,  Narrative:  Start time: 07/02/2020 9:00 AM End time: 07/02/2020 9:05 AM Injection made incrementally with aspirations every 5 mL.  Performed by: Personally  Anesthesiologist: Darral Dash, DO  Additional Notes: Patient identified. Risks/Benefits/Options discussed with patient including but not limited to bleeding, infection, nerve damage, failed block, incomplete pain control. Patient expressed understanding and wished to proceed. All questions were answered. Sterile technique was used throughout the entire procedure. Please see nursing notes for vital signs. Aspirated in 5cc intervals with injection for negative confirmation. Patient was given instructions on fall risk and not to get out of bed. All questions and concerns addressed with instructions to call with any issues or inadequate analgesia.

## 2020-07-02 NOTE — Op Note (Signed)
Pre-op Diagnosis:  Right breast ductal carcinoma in situ with microinvasion Post-op Diagnosis: same Procedure:  Right radioactive seed localized lumpectomy and sentinel lymph node biopsy with blue dye injection Surgeon:  Xiong Haidar K. Anesthesia:  GEN - LMA/ PEC block Indications:  This is a 60 year old female who presented with screening detected calcifications in the right breast. A 1 cm group of calcifications was seen in the right upper outer quadrant by diagnostic imaging and a biopsy on 05/14/2020 revealed high-grade DCIS with necrosis and calcifications with focal suspicion for microinvasion. Her cancer was ER positive PR negative. Ultrasound showed no lymphadenopathy in the axilla.  A radioactive seed was placed yesterday and is located anterior to the biopsy clip, which is at the anterior edge of a biopsy hematoma.   Description of procedure: The patient is brought to the operating room placed in supine position on the operating room table. After an adequate level of general anesthesia was obtained, we injected methylene blue dye solution into the dermis around her nipple.  Her right breast was prepped with ChloraPrep and draped in sterile fashion. A timeout was taken to ensure the proper patient and proper procedure. We interrogated the breast with the neoprobe. We made a circumareolar incision around the lateral side of the nipple after infiltrating with 0.25% Marcaine. Dissection was carried down in the breast tissue with cautery. We used the neoprobe to guide [place] towards the radioactive seed. We excised an area of tissue around the radioactive seed 2 cm in diameter.  We excised the anterior part of the hematoma along with the specimen. The specimen was removed and was oriented with a paint kit. Specimen mammogram showed the radioactive seed as well as the biopsy clip within the specimen. This was sent for pathologic examination. There is no residual radioactivity within the biopsy cavity.  The hematoma was completely evacuated.  We inspected carefully for hemostasis. The wound was thoroughly irrigated.   We then turned our attention to the axilla.  The settings were adjusted on the Neoprobe and we interrogated the axilla.  I made a transverse incision over the area of activity.  We dissected into the axilla and identified a radioactive lymph node.  This was dissected free and sent as "sentinel lymph node #1".  We interrogated the axilla again and a second node was identified.  This was sent as "sentinel lymph node #2."  There was minimal background activity.  The wounds were closed with a deep layer of 3-0 Vicryl and a subcuticular layer of 4-0 Monocryl. Benzoin Steri-Strips were applied. The patient was then extubated and brought to the recovery room in stable condition. All sponge, instrument, and needle counts are correct.  Imogene Burn. Georgette Dover, MD, [facility]  General/ Trauma Surgery  05/13/2020 1:38 PM

## 2020-07-02 NOTE — H&P (Signed)
History of Present Illness The patient is a 60 year old female who presents with breast cancer. Breast MDC - 05/22/20 Gud[facility]/ Sarah Hall  This is a 60 year old female who presented with screening detected calcifications in the right breast. A 1 cm group of calcifications was seen in the right upper outer quadrant by diagnostic imaging and a biopsy on 05/14/2020 revealed high-grade DCIS with necrosis and calcifications with focal suspicion for microinvasion. Her cancer was ER positive PR negative. Ultrasound showed no lymphadenopathy in the axilla. Unfortunately, she did develop a 7 cm hematoma and pseudoaneurysm within the breast following her biopsy. The patient states that the bruising is fading and the hematoma has not enlarged this week.   Problem List/Past Medical  DUCTAL CARCINOMA IN SITU (DCIS) OF RIGHT BREAST WITH MICROINVASIVE COMPONENT (D05.11)  Past Surgical History  No pertinent past surgical history  Diagnostic Studies History Colonoscopy 5-10 years ago Mammogram within last year Pap Smear 1-5 years ago  Allergies  Codeine Phosphate *ANALGESICS - OPIOID* Cannot take causes anxiousnexx HYDROcodone-Acetaminophen *ANALGESICS - OPIOID* Can not take hydrocodone/Codeine cause anxiousness  Medication History  Medications Reconciled Amphetamine-Dextroamphetamine (20MG  Tablet, Oral) Active. Temazepam (30MG  Capsule, Oral) Active. Azelastine HCl (0.1% Solution, Nasal) Active. Levothyroxine Sodium (88MCG Tablet, Oral) Active. Ondansetron HCl (4MG  Tablet, Oral) Active. Estradiol (0.1MG /GM Cream, Vaginal) Active. Multiple Vitamin (1 (one) Oral) Active. Magnesium (30MG  Tablet, Oral) Active.  Social History Alcohol use Moderate alcohol use. Caffeine use Tea. No drug use Tobacco use Never smoker.  Family History Colon Cancer Family Members In General. Depression Mother. Ovarian Cancer Family Members In General. Thyroid problems  Sister.  Pregnancy / Birth History Age at menarche 33 years. Age of menopause 15-50 Gravida 3 Length (months) of breastfeeding 3-6 Maternal age 38-40 Para 1  Other Problems  Back Pain Depression Thyroid Disease     Review of Systems  General Present- Night Sweats and Weight Loss. Not Present- Appetite Loss, Chills, Fatigue, Fever and Weight Gain. Skin Not Present- Change in Wart/Mole, Dryness, Hives, Jaundice, New Lesions, Non-Healing Wounds, Rash and Ulcer. HEENT Present- Seasonal Allergies and Wears glasses/contact lenses. Not Present- Earache, Hearing Loss, Hoarseness, Nose Bleed, Oral Ulcers, Ringing in the Ears, Sinus Pain, Sore Throat, Visual Disturbances and Yellow Eyes. Respiratory Not Present- Bloody sputum, Chronic Cough, Difficulty Breathing, Snoring and Wheezing. Breast Not Present- Breast Mass, Breast Pain, Nipple Discharge and Skin Changes. Cardiovascular Present- Rapid Heart Rate and Shortness of Breath. Not Present- Chest Pain, Difficulty Breathing Lying Down, Leg Cramps, Palpitations and Swelling of Extremities. Gastrointestinal Not Present- Abdominal Pain, Bloating, Bloody Stool, Change in Bowel Habits, Chronic diarrhea, Constipation, Difficulty Swallowing, Excessive gas, Gets full quickly at meals, Hemorrhoids, Indigestion, Nausea, Rectal Pain and Vomiting. Female Genitourinary Present- Frequency. Not Present- Nocturia, Painful Urination, Pelvic Pain and Urgency. Musculoskeletal Present- Back Pain. Not Present- Joint Pain, Joint Stiffness, Muscle Pain, Muscle Weakness and Swelling of Extremities. Neurological Not Present- Decreased Memory, Fainting, Headaches, Numbness, Seizures, Tingling, Tremor, Trouble walking and Weakness. Psychiatric Present- Depression. Not Present- Anxiety, Bipolar, Change in Sleep Pattern, Fearful and Frequent crying. Endocrine Not Present- Cold Intolerance, Excessive Hunger, Hair Changes, Heat Intolerance, Hot flashes and New  Diabetes. Hematology Present- Easy Bruising. Not Present- Blood Thinners, Excessive bleeding, Gland problems, HIV and Persistent Infections.   Physical Exam   The physical exam findings are as follows: Note:Constitutional: WDWN in NAD, conversant, no obvious deformities; resting comfortably Eyes: Pupils equal, round; sclera anicteric; moist conjunctiva; no lid lag HENT: Oral mucosa moist; good dentition Neck: No masses  palpated, trachea midline; no thyromegaly Lungs: CTA bilaterally; normal respiratory effort Breasts: left breast shows no palpable masses; no nipple changes; no axillary lymphadenopathy Right breast shows a large firm palpable hematoma in the RUOQ measuring about 6 cm across with widespread ecchymosis. No nipple retraction or discharge. No axillary lymphadenopathy. The ecchymosis is beginning to fade at the edges CV: Regular rate and rhythm; no murmurs; extremities well-perfused with no edema Abd: +bowel sounds, soft, non-tender, no palpable organomegaly; no palpable hernias Musc: Normal gait; no apparent clubbing or cyanosis in extremities Lymphatic: No palpable cervical or axillary lymphadenopathy Skin: Warm, dry; no sign of jaundice Psychiatric - alert and oriented x 4; calm mood and affect    Assessment & Plan  DUCTAL CARCINOMA IN SITU (DCIS) OF RIGHT BREAST WITH MICROINVASIVE COMPONENT (D05.11)  Current Plans Schedule for Surgery - Right radioactive seed localized lumpectomy. The surgical procedure has been discussed with the patient. Potential risks, benefits, alternative treatments, and expected outcomes have been explained. All of the patient's questions at this time have been answered. The likelihood of reaching the patient's treatment goal is good. The patient understand the proposed surgical procedure and wishes to proceed. Note:Due to the large hematoma/ pseudoaneurysm, we will delay surgery for a few weeks.  Anti-estrogens to begin immediately  due to the delay in surgery.  We will also perform right axillary sentinel lymph node biopsy/ blue dye injection at the time of surgery due to the area of microinvasion  Breast-conserving surgery followed by radiation and anti-estrogens.  Imogene Burn. Georgette Dover, MD, [facility]  General/ Trauma Surgery   07/02/2020 10:39 AM

## 2020-07-02 NOTE — Progress Notes (Signed)
Assisted Dr. Jana Half with right, ultrasound guided, pectoralis block. Side rails up, monitors on throughout procedure. See vital signs in flow sheet. Tolerated Procedure well.

## 2020-07-03 ENCOUNTER — Encounter (HOSPITAL_BASED_OUTPATIENT_CLINIC_OR_DEPARTMENT_OTHER): Payer: Self-pay | Admitting: Surgery

## 2020-07-04 LAB — SURGICAL PATHOLOGY

## 2020-07-05 ENCOUNTER — Ambulatory Visit (HOSPITAL_COMMUNITY)
Admission: RE | Admit: 2020-07-05 | Discharge: 2020-07-05 | Disposition: A | Discharge status: Home / Self Care | Payer: 59 | Source: Ambulatory Visit | Attending: Surgery | Admitting: Surgery

## 2020-07-05 ENCOUNTER — Encounter (HOSPITAL_COMMUNITY)
Admission: RE | Disposition: A | Discharge status: Home / Self Care | Payer: Self-pay | Source: Ambulatory Visit | Attending: Surgery

## 2020-07-05 ENCOUNTER — Ambulatory Visit (HOSPITAL_COMMUNITY): Payer: 59 | Admitting: Anesthesiology

## 2020-07-05 ENCOUNTER — Other Ambulatory Visit: Payer: Self-pay

## 2020-07-05 ENCOUNTER — Other Ambulatory Visit: Payer: Self-pay | Admitting: Surgery

## 2020-07-05 ENCOUNTER — Ambulatory Visit: Payer: Self-pay | Admitting: Surgery

## 2020-07-05 ENCOUNTER — Encounter (HOSPITAL_COMMUNITY): Payer: Self-pay | Admitting: Surgery

## 2020-07-05 DIAGNOSIS — Z885 Allergy status to narcotic agent status: Secondary | ICD-10-CM | POA: Diagnosis not present

## 2020-07-05 DIAGNOSIS — Z8349 Family history of other endocrine, nutritional and metabolic diseases: Secondary | ICD-10-CM | POA: Insufficient documentation

## 2020-07-05 DIAGNOSIS — D0511 Intraductal carcinoma in situ of right breast: Secondary | ICD-10-CM | POA: Insufficient documentation

## 2020-07-05 DIAGNOSIS — Z79899 Other long term (current) drug therapy: Secondary | ICD-10-CM | POA: Insufficient documentation

## 2020-07-05 DIAGNOSIS — Z818 Family history of other mental and behavioral disorders: Secondary | ICD-10-CM | POA: Diagnosis not present

## 2020-07-05 DIAGNOSIS — Z8041 Family history of malignant neoplasm of ovary: Secondary | ICD-10-CM | POA: Insufficient documentation

## 2020-07-05 DIAGNOSIS — Z20822 Contact with and (suspected) exposure to covid-19: Secondary | ICD-10-CM | POA: Insufficient documentation

## 2020-07-05 DIAGNOSIS — L7632 Postprocedural hematoma of skin and subcutaneous tissue following other procedure: Secondary | ICD-10-CM | POA: Diagnosis present

## 2020-07-05 DIAGNOSIS — Z800 Family history of malignant neoplasm of digestive organs: Secondary | ICD-10-CM | POA: Diagnosis not present

## 2020-07-05 DIAGNOSIS — Y838 Other surgical procedures as the cause of abnormal reaction of the patient, or of later complication, without mention of misadventure at the time of the procedure: Secondary | ICD-10-CM | POA: Insufficient documentation

## 2020-07-05 DIAGNOSIS — Z886 Allergy status to analgesic agent status: Secondary | ICD-10-CM | POA: Insufficient documentation

## 2020-07-05 HISTORY — PX: BREAST CYST EXCISION: SHX579

## 2020-07-05 LAB — SARS CORONAVIRUS 2 BY RT PCR (HOSPITAL ORDER, PERFORMED IN ~~LOC~~ HOSPITAL LAB): SARS Coronavirus 2: NEGATIVE

## 2020-07-05 SURGERY — EXCISION, CYST, BREAST
Anesthesia: General | Site: Breast | Laterality: Right

## 2020-07-05 MED ORDER — PROPOFOL 10 MG/ML IV BOLUS
INTRAVENOUS | Status: DC | PRN
Start: 2020-07-05 — End: 2020-07-05
  Administered 2020-07-05: 170 mg via INTRAVENOUS

## 2020-07-05 MED ORDER — HEMOSTATIC AGENTS (NO CHARGE) OPTIME
TOPICAL | Status: DC | PRN
  Administered 2020-07-05: 1 via TOPICAL

## 2020-07-05 MED ORDER — LIDOCAINE 2% (20 MG/ML) 5 ML SYRINGE
INTRAMUSCULAR | Status: DC | PRN
  Administered 2020-07-05: 40 mg via INTRAVENOUS

## 2020-07-05 MED ORDER — MIDAZOLAM HCL 2 MG/2ML IJ SOLN
INTRAMUSCULAR | Status: AC
  Filled 2020-07-05: qty 2

## 2020-07-05 MED ORDER — FENTANYL CITRATE (PF) 250 MCG/5ML IJ SOLN
INTRAMUSCULAR | Status: DC | PRN
  Administered 2020-07-05 (×2): 25 ug via INTRAVENOUS

## 2020-07-05 MED ORDER — EPHEDRINE SULFATE-NACL 50-0.9 MG/10ML-% IV SOSY
PREFILLED_SYRINGE | INTRAVENOUS | Status: DC | PRN
  Administered 2020-07-05: 5 mg via INTRAVENOUS
  Administered 2020-07-05 (×2): 10 mg via INTRAVENOUS

## 2020-07-05 MED ORDER — CHLORHEXIDINE GLUCONATE 0.12 % MT SOLN: 15.0000 mL | Freq: Once | OROMUCOSAL | Status: AC

## 2020-07-05 MED ORDER — LACTATED RINGERS IV SOLN: INTRAVENOUS | Status: DC

## 2020-07-05 MED ORDER — CEFAZOLIN SODIUM-DEXTROSE 2-4 GM/100ML-% IV SOLN
2.0000 g | INTRAVENOUS | Status: AC
  Administered 2020-07-05: 2 g via INTRAVENOUS
  Filled 2020-07-05: qty 100

## 2020-07-05 MED ORDER — CHLORHEXIDINE GLUCONATE CLOTH 2 % EX PADS: 6.0000 | MEDICATED_PAD | Freq: Once | CUTANEOUS | Status: DC

## 2020-07-05 MED ORDER — BUPIVACAINE-EPINEPHRINE (PF) 0.25% -1:200000 IJ SOLN
INTRAMUSCULAR | Status: AC
  Filled 2020-07-05: qty 10

## 2020-07-05 MED ORDER — BUPIVACAINE-EPINEPHRINE (PF) 0.25% -1:200000 IJ SOLN
INTRAMUSCULAR | Status: DC | PRN
  Administered 2020-07-05: 10 mL via PERINEURAL

## 2020-07-05 MED ORDER — BUPIVACAINE HCL (PF) 0.25 % IJ SOLN
INTRAMUSCULAR | Status: AC
  Filled 2020-07-05: qty 30

## 2020-07-05 MED ORDER — DEXAMETHASONE SODIUM PHOSPHATE 10 MG/ML IJ SOLN
INTRAMUSCULAR | Status: DC | PRN
  Administered 2020-07-05: 10 mg via INTRAVENOUS

## 2020-07-05 MED ORDER — ORAL CARE MOUTH RINSE: 15.0000 mL | Freq: Once | OROMUCOSAL | Status: AC

## 2020-07-05 MED ORDER — ONDANSETRON HCL 4 MG/2ML IJ SOLN
INTRAMUSCULAR | Status: DC | PRN
  Administered 2020-07-05: 4 mg via INTRAVENOUS

## 2020-07-05 MED ORDER — FENTANYL CITRATE (PF) 100 MCG/2ML IJ SOLN: 25.0000 ug | INTRAMUSCULAR | Status: DC | PRN

## 2020-07-05 MED ORDER — PROPOFOL 10 MG/ML IV BOLUS
INTRAVENOUS | Status: AC
  Filled 2020-07-05: qty 20

## 2020-07-05 MED ORDER — ACETAMINOPHEN 325 MG PO TABS: 325.0000 mg | ORAL_TABLET | Freq: Once | ORAL | Status: DC | PRN

## 2020-07-05 MED ORDER — ACETAMINOPHEN 160 MG/5ML PO SOLN: 325.0000 mg | Freq: Once | ORAL | Status: DC | PRN

## 2020-07-05 MED ORDER — ACETAMINOPHEN 10 MG/ML IV SOLN: 1000.0000 mg | Freq: Once | INTRAVENOUS | Status: DC | PRN

## 2020-07-05 MED ORDER — CHLORHEXIDINE GLUCONATE 0.12 % MT SOLN
OROMUCOSAL | Status: AC
  Administered 2020-07-05: 15 mL via OROMUCOSAL
  Filled 2020-07-05: qty 15

## 2020-07-05 MED ORDER — MEPERIDINE HCL 25 MG/ML IJ SOLN: 6.2500 mg | INTRAMUSCULAR | Status: DC | PRN

## 2020-07-05 MED ORDER — MIDAZOLAM HCL 2 MG/2ML IJ SOLN
INTRAMUSCULAR | Status: DC | PRN
  Administered 2020-07-05: 2 mg via INTRAVENOUS

## 2020-07-05 MED ORDER — FENTANYL CITRATE (PF) 250 MCG/5ML IJ SOLN
INTRAMUSCULAR | Status: AC
  Filled 2020-07-05: qty 5

## 2020-07-05 MED ORDER — AMISULPRIDE (ANTIEMETIC) 5 MG/2ML IV SOLN: 10.0000 mg | Freq: Once | INTRAVENOUS | Status: DC | PRN

## 2020-07-05 MED ORDER — PHENYLEPHRINE 40 MCG/ML (10ML) SYRINGE FOR IV PUSH (FOR BLOOD PRESSURE SUPPORT)
PREFILLED_SYRINGE | INTRAVENOUS | Status: DC | PRN
  Administered 2020-07-05: 80 ug via INTRAVENOUS

## 2020-07-05 SURGICAL SUPPLY — 45 items
APPLIER CLIP 9.375 MED OPEN (MISCELLANEOUS) ×2
BENZOIN TINCTURE PRP APPL 2/3 (GAUZE/BANDAGES/DRESSINGS) ×2 IMPLANT
BINDER BREAST LRG (GAUZE/BANDAGES/DRESSINGS) IMPLANT
BINDER BREAST XLRG (GAUZE/BANDAGES/DRESSINGS) ×2 IMPLANT
BLADE CLIPPER SURG (BLADE) IMPLANT
CHLORAPREP W/TINT 26 (MISCELLANEOUS) ×2 IMPLANT
CLIP APPLIE 9.375 MED OPEN (MISCELLANEOUS) ×1 IMPLANT
CLIP VESOCCLUDE SM WIDE 24/CT (CLIP) IMPLANT
CNTNR URN SCR LID CUP LEK RST (MISCELLANEOUS) ×1 IMPLANT
CONT SPEC 4OZ STRL OR WHT (MISCELLANEOUS) ×2
COVER SURGICAL LIGHT HANDLE (MISCELLANEOUS) ×2 IMPLANT
COVER WAND RF STERILE (DRAPES) ×2 IMPLANT
DECANTER SPIKE VIAL GLASS SM (MISCELLANEOUS) IMPLANT
DRAPE LAPAROTOMY TRNSV 102X78 (DRAPES) ×2 IMPLANT
DRSG TEGADERM 4X4.75 (GAUZE/BANDAGES/DRESSINGS) ×2 IMPLANT
ELECT CAUTERY BLADE 6.4 (BLADE) IMPLANT
ELECT REM PT RETURN 9FT ADLT (ELECTROSURGICAL) ×2
ELECTRODE REM PT RTRN 9FT ADLT (ELECTROSURGICAL) ×1 IMPLANT
GAUZE SPONGE 4X4 12PLY STRL (GAUZE/BANDAGES/DRESSINGS) IMPLANT
GLOVE BIO SURGEON STRL SZ7 (GLOVE) ×2 IMPLANT
GLOVE BIOGEL PI IND STRL 7.5 (GLOVE) ×1 IMPLANT
GLOVE BIOGEL PI INDICATOR 7.5 (GLOVE) ×1
GOWN STRL REUS W/ TWL LRG LVL3 (GOWN DISPOSABLE) ×2 IMPLANT
GOWN STRL REUS W/TWL LRG LVL3 (GOWN DISPOSABLE) ×4
HEMOSTAT ARISTA ABSORB 3G PWDR (HEMOSTASIS) ×2 IMPLANT
KIT BASIN OR (CUSTOM PROCEDURE TRAY) ×2 IMPLANT
KIT MARKER MARGIN INK (KITS) IMPLANT
KIT TURNOVER KIT B (KITS) ×2 IMPLANT
LIGHT WAVEGUIDE WIDE FLAT (MISCELLANEOUS) IMPLANT
NEEDLE HYPO 25GX1X1/2 BEV (NEEDLE) ×2 IMPLANT
NS IRRIG 1000ML POUR BTL (IV SOLUTION) ×2 IMPLANT
PACK GENERAL/GYN (CUSTOM PROCEDURE TRAY) ×2 IMPLANT
PAD ARMBOARD 7.5X6 YLW CONV (MISCELLANEOUS) ×4 IMPLANT
PENCIL SMOKE EVACUATOR (MISCELLANEOUS) ×2 IMPLANT
SPECIMEN JAR MEDIUM (MISCELLANEOUS) ×2 IMPLANT
SPONGE GAUZE 2X2 8PLY STRL LF (GAUZE/BANDAGES/DRESSINGS) ×2 IMPLANT
SPONGE LAP 4X18 RFD (DISPOSABLE) ×2 IMPLANT
STRIP CLOSURE SKIN 1/2X4 (GAUZE/BANDAGES/DRESSINGS) IMPLANT
STRIP CLOSURE SKIN 1/4X4 (GAUZE/BANDAGES/DRESSINGS) ×2 IMPLANT
SUT MNCRL AB 4-0 PS2 18 (SUTURE) ×2 IMPLANT
SUT VIC AB 3-0 SH 27 (SUTURE) ×2
SUT VIC AB 3-0 SH 27X BRD (SUTURE) ×1 IMPLANT
SYR CONTROL 10ML LL (SYRINGE) ×2 IMPLANT
TOWEL GREEN STERILE (TOWEL DISPOSABLE) ×2 IMPLANT
TOWEL GREEN STERILE FF (TOWEL DISPOSABLE) ×2 IMPLANT

## 2020-07-05 NOTE — Anesthesia Postprocedure Evaluation (Signed)
Anesthesia Post Note  Patient: Sarah Hall  Procedure(s) Performed: EVACUATION OF RIGHT BREAST HEMATOMA (Right Breast)     Patient location during evaluation: PACU Anesthesia Type: General Level of consciousness: awake and alert Pain management: pain level controlled Vital Signs Assessment: post-procedure vital signs reviewed and stable Respiratory status: spontaneous breathing, nonlabored ventilation, respiratory function stable and patient connected to nasal cannula oxygen Cardiovascular status: blood pressure returned to baseline and stable Postop Assessment: no apparent nausea or vomiting Anesthetic complications: no   No complications documented.  Last Vitals:  Vitals:   07/05/20 1415 07/05/20 1430  BP: (!) 145/85 (!) 150/81  Pulse: 76 72  Resp: 15 20  Temp:  36.7 C  SpO2: 100% 97%    Last Pain:  Vitals:   07/05/20 1430  TempSrc:   PainSc: 0-No pain                 Effie Berkshire

## 2020-07-05 NOTE — Interval H&P Note (Signed)
History and Physical Interval Note:  07/05/2020 11:08 AM  Sarah Hall  has presented today for surgery, with the diagnosis of RIGHT BREAST POST OP HEMATOMA.  The various methods of treatment have been discussed with the patient and family. After consideration of risks, benefits and other options for treatment, the patient has consented to  Procedure(s) with comments: EVACUATION OF RIGHT BREAST HEMATOMA (Right) - LMA as a surgical intervention.  The patient's history has been reviewed, patient examined, no change in status, stable for surgery.  I have reviewed the patient's chart and labs.  Questions were answered to the patient's satisfaction.    She presented to the office 3 days post-op with a large hematoma in her lumpectomy site with surrounding ecchymosis.  We will take her to the OR later today for evacuation of the hematoma.  Maia Petties

## 2020-07-05 NOTE — Discharge Instructions (Signed)
[facility] °Office Phone Number [phone] ° °BREAST BIOPSY/ PARTIAL MASTECTOMY: POST OP INSTRUCTIONS ° °Always review your discharge instruction sheet given to you by the facility where your surgery was performed. ° °IF YOU HAVE DISABILITY OR FAMILY LEAVE FORMS, YOU MUST BRING THEM TO THE OFFICE FOR PROCESSING.  DO NOT GIVE THEM TO YOUR DOCTOR. ° °1. A prescription for pain medication may be given to you upon discharge.  Take your pain medication as prescribed, if needed.  If narcotic pain medicine is not needed, then you may take acetaminophen (Tylenol) or ibuprofen (Advil) as needed. °2. Take your usually prescribed medications unless otherwise directed °3. If you need a refill on your pain medication, please contact your pharmacy.  They will contact our office to request authorization.  Prescriptions will not be filled after 5pm or on week-ends. °4. You should eat very light the first 24 hours after surgery, such as soup, crackers, pudding, etc.  Resume your normal diet the day after surgery. °5. Most patients will experience some swelling and bruising in the breast.  Ice packs and a good support bra will help.  Swelling and bruising can take several days to resolve.  °6. It is common to experience some constipation if taking pain medication after surgery.  Increasing fluid intake and taking a stool softener will usually help or prevent this problem from occurring.  A mild laxative (Milk of Magnesia or Miralax) should be taken according to package directions if there are no bowel movements after 48 hours. °7. Unless discharge instructions indicate otherwise, you may remove your bandages 24-48 hours after surgery, and you may shower at that time.  You may have steri-strips (small skin tapes) in place directly over the incision.  These strips should be left on the skin for 7-10 days.  If your surgeon used skin glue on the incision, you may shower in 24 hours.  The glue will flake off over the  next 2-3 weeks.  Any sutures or staples will be removed at the office during your follow-up visit. °8. ACTIVITIES:  You may resume regular daily activities (gradually increasing) beginning the next day.  Wearing a good support bra or sports bra minimizes pain and swelling.  You may have sexual intercourse when it is comfortable. °a. You may drive when you no longer are taking prescription pain medication, you can comfortably wear a seatbelt, and you can safely maneuver your car and apply brakes. °b. RETURN TO WORK:  ______________________________________________________________________________________ °9. You should see your doctor in the office for a follow-up appointment approximately two weeks after your surgery.  Your doctor’s nurse will typically make your follow-up appointment when she calls you with your pathology report.  Expect your pathology report 2-3 business days after your surgery.  You may call to check if you do not hear from us after three days. °10. OTHER INSTRUCTIONS: _______________________________________________________________________________________________ _____________________________________________________________________________________________________________________________________ °_____________________________________________________________________________________________________________________________________ °_____________________________________________________________________________________________________________________________________ ° °WHEN TO CALL YOUR DOCTOR: °1. Fever over 101.0 °2. Nausea and/or vomiting. °3. Extreme swelling or bruising. °4. Continued bleeding from incision. °5. Increased pain, redness, or drainage from the incision. ° °The clinic staff is available to answer your questions during regular business hours.  Please don’t hesitate to call and ask to speak to one of the nurses for clinical concerns.  If you have a medical emergency, go to the nearest  emergency room or call 911.  A surgeon from [facility] is always on call at the hospital. ° °For further questions, please visit [email or web address]  °

## 2020-07-05 NOTE — Transfer of Care (Signed)
Immediate Anesthesia Transfer of Care Note  Patient: Sarah Hall  Procedure(s) Performed: EVACUATION OF RIGHT BREAST HEMATOMA (Right Breast)  Patient Location: PACU  Anesthesia Type:General  Level of Consciousness: awake, alert  and oriented  Airway & Oxygen Therapy: Patient Spontanous Breathing  Post-op Assessment: Report given to RN and Post -op Vital signs reviewed and stable  Post vital signs: Reviewed and stable  Last Vitals:  Vitals Value Taken Time  BP 135/82 07/05/20 1401  Temp    Pulse 73 07/05/20 1404  Resp 16 07/05/20 1404  SpO2 100 % 07/05/20 1404  Vitals shown include unvalidated device data.  Last Pain:  Vitals:   07/05/20 1106  TempSrc: Oral         Complications: No complications documented.

## 2020-07-05 NOTE — Anesthesia Procedure Notes (Signed)
Procedure Name: LMA Insertion Date/Time: 07/05/2020 1:16 PM Performed by: Dorthea Cove, CRNA Pre-anesthesia Checklist: Patient identified, Emergency Drugs available, Suction available, Patient being monitored and Timeout performed Patient Re-evaluated:Patient Re-evaluated prior to induction Oxygen Delivery Method: Circle system utilized Preoxygenation: Pre-oxygenation with 100% oxygen Induction Type: IV induction Ventilation: Mask ventilation without difficulty LMA: LMA inserted LMA Size: 4.0 Number of attempts: 1 Airway Equipment and Method: Bite block Placement Confirmation: ETT inserted through vocal cords under direct vision,  positive ETCO2,  breath sounds checked- equal and bilateral and CO2 detector Tube secured with: Tape Dental Injury: Teeth and Oropharynx as per pre-operative assessment

## 2020-07-05 NOTE — Anesthesia Preprocedure Evaluation (Addendum)
Anesthesia Evaluation  Patient identified by MRN, date of birth, ID band Patient awake    Reviewed: Allergy & Precautions, NPO status , Patient's Chart, lab work & pertinent test results  Airway Mallampati: II  TM Distance: >3 FB Neck ROM: Full    Dental  (+) Teeth Intact, Dental Advisory Given   Pulmonary    breath sounds clear to auscultation       Cardiovascular negative cardio ROS   Rhythm:Regular Rate:Normal     Neuro/Psych PSYCHIATRIC DISORDERS Depression negative neurological ROS     GI/Hepatic negative GI ROS, (+)     substance abuse  alcohol use,   Endo/Other  Hypothyroidism   Renal/GU negative Renal ROS  negative genitourinary   Musculoskeletal negative musculoskeletal ROS (+)   Abdominal Normal abdominal exam  (+)   Peds  Hematology negative hematology ROS (+)   Anesthesia Other Findings Right breast postop hematoma   Reproductive/Obstetrics negative OB ROS                            Anesthesia Physical Anesthesia Plan  ASA: II  Anesthesia Plan: General   Post-op Pain Management:    Induction: Intravenous  PONV Risk Score and Plan: Ondansetron, Dexamethasone, Midazolam and Treatment may vary due to age or medical condition  Airway Management Planned: LMA  Additional Equipment:   Intra-op Plan:   Post-operative Plan: Extubation in OR  Informed Consent: I have reviewed the patients History and Physical, chart, labs and discussed the procedure including the risks, benefits and alternatives for the proposed anesthesia with the patient or authorized representative who has indicated his/her understanding and acceptance.     Dental advisory given  Plan Discussed with: CRNA  Anesthesia Plan Comments:         Anesthesia Quick Evaluation

## 2020-07-05 NOTE — H&P (Signed)
History of Present Illness The patient is a 60 year old female who presents with breast cancer. Breast MDC - 05/22/20 Gudena/ Lisbeth Renshaw  This is a 60 year old female who presented with screening detected calcifications in the right breast. A 1 cm group of calcifications was seen in the right upper outer quadrant by diagnostic imaging and a biopsy on 05/14/2020 revealed high-grade DCIS with necrosis and calcifications with focal suspicion for microinvasion. Her cancer was ER positive PR negative. Lumpectomy earlier this week.  Post-op hematoma seen today.  To OR emergently for evacuation.   Problem List/Past Medical  DUCTAL CARCINOMA IN SITU (DCIS) OF RIGHT BREAST WITH MICROINVASIVE COMPONENT (D05.11)  Past Surgical History  No pertinent past surgical history  Diagnostic Studies History Colonoscopy 5-10 years ago Mammogram within last year Pap Smear 1-5 years ago  Allergies  Codeine Phosphate *ANALGESICS - OPIOID* Cannot take causes anxiousnexx HYDROcodone-Acetaminophen *ANALGESICS - OPIOID* Can not take hydrocodone/Codeine cause anxiousness  Medication History  Medications Reconciled Amphetamine-Dextroamphetamine (20MG  Tablet, Oral) Active. Temazepam (30MG  Capsule, Oral) Active. Azelastine HCl (0.1% Solution, Nasal) Active. Levothyroxine Sodium (88MCG Tablet, Oral) Active. Ondansetron HCl (4MG  Tablet, Oral) Active. Estradiol (0.1MG /GM Cream, Vaginal) Active. Multiple Vitamin (1 (one) Oral) Active. Magnesium (30MG  Tablet, Oral) Active.  Social History Alcohol use Moderate alcohol use. Caffeine use Tea. No drug use Tobacco use Never smoker.  Family History Colon Cancer Family Members In General. Depression Mother. Ovarian Cancer Family Members In General. Thyroid problems Sister.  Pregnancy / Birth History Age at menarche 17 years. Age of menopause 49-50 Gravida 3 Length (months) of breastfeeding 3-6 Maternal age 25-40 Para  1  Other Problems  Back Pain Depression Thyroid Disease     Review of Systems  General Present- Night Sweats and Weight Loss. Not Present- Appetite Loss, Chills, Fatigue, Fever and Weight Gain. Skin Not Present- Change in Wart/Mole, Dryness, Hives, Jaundice, New Lesions, Non-Healing Wounds, Rash and Ulcer. HEENT Present- Seasonal Allergies and Wears glasses/contact lenses. Not Present- Earache, Hearing Loss, Hoarseness, Nose Bleed, Oral Ulcers, Ringing in the Ears, Sinus Pain, Sore Throat, Visual Disturbances and Yellow Eyes. Respiratory Not Present- Bloody sputum, Chronic Cough, Difficulty Breathing, Snoring and Wheezing. Breast Not Present- Breast Mass, Breast Pain, Nipple Discharge and Skin Changes. Cardiovascular Present- Rapid Heart Rate and Shortness of Breath. Not Present- Chest Pain, Difficulty Breathing Lying Down, Leg Cramps, Palpitations and Swelling of Extremities. Gastrointestinal Not Present- Abdominal Pain, Bloating, Bloody Stool, Change in Bowel Habits, Chronic diarrhea, Constipation, Difficulty Swallowing, Excessive gas, Gets full quickly at meals, Hemorrhoids, Indigestion, Nausea, Rectal Pain and Vomiting. Female Genitourinary Present- Frequency. Not Present- Nocturia, Painful Urination, Pelvic Pain and Urgency. Musculoskeletal Present- Back Pain. Not Present- Joint Pain, Joint Stiffness, Muscle Pain, Muscle Weakness and Swelling of Extremities. Neurological Not Present- Decreased Memory, Fainting, Headaches, Numbness, Seizures, Tingling, Tremor, Trouble walking and Weakness. Psychiatric Present- Depression. Not Present- Anxiety, Bipolar, Change in Sleep Pattern, Fearful and Frequent crying. Endocrine Not Present- Cold Intolerance, Excessive Hunger, Hair Changes, Heat Intolerance, Hot flashes and New Diabetes. Hematology Present- Easy Bruising. Not Present- Blood Thinners, Excessive bleeding, Gland problems, HIV and Persistent Infections.   Physical Exam   The  physical exam findings are as follows: Note:Constitutional: WDWN in NAD, conversant, no obvious deformities; resting comfortably Eyes: Pupils equal, round; sclera anicteric; moist conjunctiva; no lid lag HENT: Oral mucosa moist; good dentition Neck: No masses palpated, trachea midline; no thyromegaly Lungs: CTA bilaterally; normal respiratory effort Breasts: left breast shows no palpable masses; no nipple changes; no axillary lymphadenopathy  Right breast shows a large firm palpable hematoma in the RUOQ measuring about 6 cm across with widespread ecchymosis. No nipple retraction or discharge. No axillary lymphadenopathy. The ecchymosis is beginning to fade at the edges CV: Regular rate and rhythm; no murmurs; extremities well-perfused with no edema Abd: +bowel sounds, soft, non-tender, no palpable organomegaly; no palpable hernias Musc: Normal gait; no apparent clubbing or cyanosis in extremities Lymphatic: No palpable cervical or axillary lymphadenopathy Skin: Warm, dry; no sign of jaundice Psychiatric - alert and oriented x 4; calm mood and affect    Assessment & Plan  Right breast hematoma.  Current Plans Schedule for Surgery - Evacuation hematoma right breast  Imogene Burn. Georgette Dover, MD, [facility]  General/ Trauma Surgery   07/05/2020 1:03 PM

## 2020-07-05 NOTE — Op Note (Signed)
Preop diagnosis: Right breast post-op hematoma Postop diagnosis: Same Procedure performed:  Evacuation of right breast hematoma Surgeon:  Maia Petties Anesthesia: Gen - LMA Indications:  S/p right breast lumpectomy on 07/03/19 presented to the office this morning with an obvious post-op hematoma.  She has been NPO all day so I brought her directly to the OR for evacuation.  Procedure: The patient is brought to the operating room and placed in the supine position on the operating room table.  After an adequate level general anesthesia was obtained, the Steri-Strips were removed from her right circumareolar incision.  Her right breast was prepped with ChloraPrep and draped in sterile fashion.  A timeout was taken to ensure the proper patient and proper procedure.  A scalpel was used to cut the 4-0 Monocryl suture in the incision.  We opened incision widely.  I evacuated a large amount of clot from the wound.  We irrigated the wound thoroughly.  The deep lateral part of the incision seems to have some oozing.  We thoroughly cauterized this area.  We the then spent several minutes irrigating and inspecting the remainder of the biopsy cavity.  No other bleeding was noted.  We sprayed Arista powder widely throughout the cavity.  I then spent several minutes just watching the wound to see if there is any further bleeding.  I did not see any bleeding.  Clips were placed to orient the margins of the lumpectomy.  We closed with a deep layer of 3-0 Vicryl and a subcuticular 4-0 Monocryl.  Benzoin and Steri-Strips were applied.  A dry dressing is placed.  She was extubated and brought to the recovery room in stable condition.  All sponge, instrument, and needle counts are correct.  Sarah Hall. Sarah Dover, MD, [facility]  General/ Trauma Surgery   07/05/2020 2:08 PM

## 2020-07-06 ENCOUNTER — Encounter (HOSPITAL_COMMUNITY): Payer: Self-pay | Admitting: Surgery

## 2020-07-08 ENCOUNTER — Encounter: Payer: Self-pay | Admitting: *Deleted

## 2020-07-08 NOTE — Progress Notes (Signed)
Patient Care Team: Laurey Morale, MD as PCP - General (Family Medicine) Mauro Kaufmann, RN as Oncology Nurse Navigator Rockwell Germany, RN as Oncology Nurse Navigator Donnie Mesa, MD as Consulting Physician (General Surgery) Nicholas Lose, MD as Consulting Physician (Hematology and Oncology) Kyung Rudd, MD as Consulting Physician (Radiation Oncology)  DIAGNOSIS:    ICD-10-CM   1. Ductal carcinoma in situ (DCIS) of right breast  D05.11     SUMMARY OF ONCOLOGIC HISTORY: Oncology History  Ductal carcinoma in situ (DCIS) of right breast  05/20/2020 Initial Diagnosis   Screening mammogram showed right breast calcifications. Diagnostic mammogram showed a 1.0cm group of calcifications in the upper outer right breast. Biopsy showed DCIS, high grade, ER+ 5%, PR- 0%.    07/02/2020 Surgery   Right lumpectomy ([place]): high grade DCIS, clear margins, 2 right axillary lymph nodes negative for carcinoma.     CHIEF COMPLIANT: Follow-up of right breast DCIS s/p right lumpectomy   INTERVAL HISTORY: Sarah Hall is a 60 y.o. with above-mentioned history of ductal carcinoma in situ of the right breast. She underwent a right lumpectomy with Dr. Georgette Dover on 07/02/20 for which pathology showed high grade DCIS, clear margins, 2 right axillary lymph nodes negative for carcinoma.  Postoperatively she had a hematoma which had to be drained/evacuated past Friday.  She presents to the clinic today to discuss the pathology report and further treatment.  She complains of numbness under the arm as well as a stinging sensation intermittently.  ALLERGIES:  is allergic to codeine and codeine phosphate.  MEDICATIONS:  Current Outpatient Medications  Medication Sig Dispense Refill   amphetamine-dextroamphetamine (ADDERALL) 20 MG tablet Take 1 tablet (20 mg total) by mouth 2 (two) times daily. (Patient taking differently: Take 10 mg by mouth daily.) 60 tablet 0   Aspirin-Acetaminophen-Caffeine (GOODYS  EXTRA STRENGTH PO) Take 1 packet by mouth daily as needed (pain).     azelastine (ASTELIN) 0.1 % nasal spray PLACE 2 SPRAYS INTO EACH NOSTRIL TWICE DAILY AS DIRECTED (Patient taking differently: Place 2 sprays into both nostrils 2 (two) times daily as needed for rhinitis.) 30 mL 11   HYDROcodone-acetaminophen (NORCO/VICODIN) 5-325 MG tablet Take 1 tablet by mouth every 6 (six) hours as needed for moderate pain. 15 tablet 0   ibuprofen (ADVIL) 200 MG tablet Take 200 mg by mouth every 8 (eight) hours as needed for mild pain.     levothyroxine (SYNTHROID, LEVOTHROID) 88 MCG tablet Take 88 mcg by mouth daily before breakfast.  1   magnesium 30 MG tablet Take 300 mg by mouth daily.      Multiple Vitamin (MULTIVITAMIN) tablet Take 1 tablet by mouth daily.     tamoxifen (NOLVADEX) 10 MG tablet TAKE 1 TABLET(10 MG) BY MOUTH DAILY (Patient taking differently: Take 10 mg by mouth 2 (two) times daily.) 30 tablet 0   temazepam (RESTORIL) 30 MG capsule TAKE 1 CAPSULE(30 MG) BY MOUTH AT BEDTIME AS NEEDED FOR SLEEP (Patient taking differently: Take 30 mg by mouth at bedtime as needed for sleep.) 30 capsule 5   No current facility-administered medications for this visit.    PHYSICAL EXAMINATION: ECOG PERFORMANCE STATUS: 1 - Symptomatic but completely ambulatory  Vitals:   07/09/20 1450  BP: (!) 155/85  Pulse: (!) 107  Resp: 18  Temp: 98.1 F (36.7 C)  SpO2: 98%   Filed Weights   07/09/20 1450  Weight: 118 lb 11.2 oz (53.8 kg)    LABORATORY DATA:  I have reviewed  the data as listed CMP Latest Ref Rng & Units 05/22/2020 01/08/2020 08/16/2018  Glucose 70 - 99 mg/dL 95 91 82  BUN 6 - 20 mg/dL 17 10 14   Creatinine 0.44 - 1.00 mg/dL 0.75 0.59 0.62  Sodium 135 - 145 mmol/L 141 139 139  Potassium 3.5 - 5.1 mmol/L 3.6 4.0 4.3  Chloride 98 - 111 mmol/L 105 101 101  CO2 22 - 32 mmol/L 30 29 29   Calcium 8.9 - 10.3 mg/dL 9.3 9.9 9.0  Total Protein 6.5 - 8.1 g/dL 6.8 6.9 6.7  Total Bilirubin 0.3 -  1.2 mg/dL 0.7 0.6 0.5  Alkaline Phos 38 - 126 U/L 74 - 79  AST 15 - 41 U/L 27 21 28   ALT 0 - 44 U/L 28 18 25     Lab Results  Component Value Date   WBC 6.3 05/22/2020   HGB 11.5 (L) 05/22/2020   HCT 35.2 (L) 05/22/2020   MCV 92.9 05/22/2020   PLT 219 05/22/2020   NEUTROABS 3.3 05/22/2020    ASSESSMENT & PLAN:  Ductal carcinoma in situ (DCIS) of right breast 07/02/2020: Right lumpectomy: High-grade DCIS, margins clear, 2 right axillary lymph nodes negative, ER 5%, PR 0% Tis N0 stage 0 Postoperative hematoma: Required evacuation.  Pathology counseling: I discussed the final pathology report of the patient provided  a copy of this report. I discussed the margins as well as lymph node surgeries. We also discussed the final staging along with previously performed ER/PR  Testing.  Treatment plan: 1.  Adjuvant radiation therapy followed by 2. adjuvant tamoxifen (patient took tamoxifen preoperatively because of the slight delay in surgery).  No major adverse effects with tamoxifen  Return to clinic in 4 months for survivorship care plan visit I will see her back in 10 months   No orders of the defined types were placed in this encounter.  The patient has a good understanding of the overall plan. she agrees with it. she will call with any problems that may develop before the next visit here.  Total time spent: 20 mins including face to face time and time spent for planning, charting and coordination of care  Nicholas Lose, MD 07/09/2020  I, Cloyde Reams Dorshimer, am acting as scribe for Dr. Nicholas Lose.  I have reviewed the above documentation for accuracy and completeness, and I agree with the above.

## 2020-07-09 ENCOUNTER — Other Ambulatory Visit: Payer: Self-pay

## 2020-07-09 ENCOUNTER — Inpatient Hospital Stay: Payer: 59 | Attending: Hematology and Oncology | Admitting: Hematology and Oncology

## 2020-07-09 DIAGNOSIS — D0511 Intraductal carcinoma in situ of right breast: Secondary | ICD-10-CM | POA: Diagnosis present

## 2020-07-09 DIAGNOSIS — R200 Anesthesia of skin: Secondary | ICD-10-CM | POA: Diagnosis not present

## 2020-07-09 MED ORDER — TAMOXIFEN CITRATE 10 MG PO TABS: 10.0000 mg | ORAL_TABLET | Freq: Every day | ORAL | 3 refills | Status: DC

## 2020-07-09 NOTE — Assessment & Plan Note (Addendum)
07/02/2020: Right lumpectomy: High-grade DCIS, margins clear, 2 right axillary lymph nodes negative, ER 5%, PR 0% Tis N0 stage 0  Pathology counseling: I discussed the final pathology report of the patient provided  a copy of this report. I discussed the margins as well as lymph node surgeries. We also discussed the final staging along with previously performed ER/PR  Testing.  Treatment plan: 1.  Adjuvant radiation therapy followed by 2. adjuvant tamoxifen (patient took tamoxifen preoperatively because of the slight delay in surgery).  No major adverse effects with tamoxifen  Return to clinic after radiation for survivorship care plan visit I will see her back in 9 months

## 2020-07-15 ENCOUNTER — Telehealth: Payer: Self-pay | Admitting: Family Medicine

## 2020-07-15 DIAGNOSIS — F900 Attention-deficit hyperactivity disorder, predominantly inattentive type: Secondary | ICD-10-CM

## 2020-07-15 NOTE — Telephone Encounter (Signed)
Pt call and stated she need a refill on  amphetamine-dextroamphetamine (ADDERALL) 20 MG tablet sent to  [facility] Phone:  [phone]  Fax:  [phone]      v

## 2020-07-15 NOTE — Telephone Encounter (Signed)
Please advise 

## 2020-07-16 MED ORDER — AMPHETAMINE-DEXTROAMPHETAMINE 20 MG PO TABS: 20.0000 mg | ORAL_TABLET | Freq: Two times a day (BID) | ORAL | 0 refills | Status: DC

## 2020-07-16 NOTE — Telephone Encounter (Signed)
Called pt to let her know prescription sent into pharmacy for her.

## 2020-07-16 NOTE — Telephone Encounter (Signed)
Done

## 2020-07-22 ENCOUNTER — Ambulatory Visit: Payer: 59 | Admitting: Physical Therapy

## 2020-07-25 ENCOUNTER — Telehealth: Payer: Self-pay | Admitting: Adult Health

## 2020-07-25 NOTE — Telephone Encounter (Signed)
Informed patient of her upcoming appointment. Patient is aware. °

## 2020-07-30 ENCOUNTER — Ambulatory Visit
Admission: RE | Admit: 2020-07-30 | Discharge: 2020-07-30 | Disposition: A | Discharge status: Home / Self Care | Payer: 59 | Source: Ambulatory Visit | Attending: Radiation Oncology | Admitting: Radiation Oncology

## 2020-07-30 ENCOUNTER — Other Ambulatory Visit: Payer: Self-pay

## 2020-07-30 ENCOUNTER — Encounter: Payer: Self-pay | Admitting: Radiation Oncology

## 2020-07-30 VITALS — BP 176/96 | HR 88 | Temp 96.8°F | Resp 18 | Ht 63.0 in | Wt 119.4 lb

## 2020-07-30 DIAGNOSIS — U071 COVID-19: Secondary | ICD-10-CM | POA: Diagnosis not present

## 2020-07-30 DIAGNOSIS — D0511 Intraductal carcinoma in situ of right breast: Secondary | ICD-10-CM | POA: Diagnosis present

## 2020-07-30 DIAGNOSIS — E039 Hypothyroidism, unspecified: Secondary | ICD-10-CM | POA: Diagnosis not present

## 2020-07-30 DIAGNOSIS — Z170 Estrogen receptor positive status [ER+]: Secondary | ICD-10-CM | POA: Diagnosis not present

## 2020-07-30 DIAGNOSIS — F319 Bipolar disorder, unspecified: Secondary | ICD-10-CM | POA: Diagnosis not present

## 2020-07-30 DIAGNOSIS — F909 Attention-deficit hyperactivity disorder, unspecified type: Secondary | ICD-10-CM | POA: Insufficient documentation

## 2020-07-30 DIAGNOSIS — Z803 Family history of malignant neoplasm of breast: Secondary | ICD-10-CM | POA: Diagnosis not present

## 2020-07-30 NOTE — Progress Notes (Signed)
Radiation Oncology         [phone]) [phone] ________________________________  Name: Sarah Hall        MRN: [id number]  Date of Service: 07/30/2020 DOB: [birth year]  CC:Fry, Ishmael Holter, MD  Nicholas Lose, MD     REFERRING PHYSICIAN: Nicholas Lose, MD   DIAGNOSIS: The encounter diagnosis was Ductal carcinoma in situ (DCIS) of right breast.   HISTORY OF PRESENT ILLNESS: Sarah Hall is a 60 y.o. female originally seen in the multidisciplinary breast clinic for a new diagnosis of right breast cancer. The patient was noted to have screening detected calcifications in the right breast, a 1 cm group of calcifications was seen in the upper outer quadrant by diagnostic imaging, her axilla was negative for adenopathy, and a biopsy on 05/14/2020 revealed high-grade DCIS with necrosis and calcifications with focal suspicion for microinvasion.  Her cancer was ER positive PR negative.  She did develop a 7 cm hematoma and pseudoaneurysm within the breast following her biopsy. She ultimately underwent right lumpectomy on 07/02/2020, final pathology revealed high-grade DCIS measuring a total of 1.5 cm with necrosis and calcifications, her margins were clear of carcinoma, and 2 sampled lymph nodes were clear as well.  Unfortunately she did develop another hematoma that had to be evacuated surgically on 07/05/2020.  She was to be seen today to discuss adjuvant radiotherapy.   PREVIOUS RADIATION THERAPY: No   PAST MEDICAL HISTORY:  Past Medical History:  Diagnosis Date  . ADHD (attention deficit hyperactivity disorder), inattentive type   . Ankle sprain    right ankle  . Breast cancer ([place])   . Deep breathing    hard to take a deep breath due to thyroid issue  . Depression    Bipolar depression, sees Metta Clines NP   . Hypothyroidism    cared for by Dr. Ronita Hipps        PAST SURGICAL HISTORY: Past Surgical History:  Procedure Laterality Date  . BREAST CYST EXCISION Right 07/05/2020    Procedure: EVACUATION OF RIGHT BREAST HEMATOMA;  Surgeon: Donnie Mesa, MD;  Location: [facility];  Service: General;  Laterality: Right;  LMA  . BREAST LUMPECTOMY WITH RADIOACTIVE SEED AND SENTINEL LYMPH NODE BIOPSY Right 07/02/2020   Procedure: RIGHT BREAST LUMPECTOMY WITH RADIOACTIVE SEED AND RIGHT AXILLARY SENTINEL LYMPH NODE BIOPSY, BLUE DYE INJECTION;  Surgeon: Donnie Mesa, MD;  Location: [facility];  Service: General;  Laterality: Right;  PEC BLOCK, BLUE DYE INJECTION  . COLONOSCOPY  09/03/2014   per Dr. Fuller Plan, benign polyps, repeat in 10 yrs   . DILATION AND EVACUATION      2 times  . scraping of uterus     20 years ago  . WISDOM TOOTH EXTRACTION       FAMILY HISTORY:  Family History  Problem Relation Age of Onset  . Alzheimer's disease Father   . Breast cancer Paternal Grandmother   . Colon cancer Maternal Uncle   . Uterine cancer Maternal Grandmother   . Esophageal cancer Neg Hx   . Rectal cancer Neg Hx   . Stomach cancer Neg Hx      SOCIAL HISTORY:  reports that she has never smoked. She has never used smokeless tobacco. She reports current alcohol use of about 10.0 standard drinks of alcohol per week. She reports that she does not use drugs.  The patient is separated and lives in [place].   She has a teenage son who is waiting to hear back about  college applications.   ALLERGIES: Codeine and Codeine phosphate   MEDICATIONS:  Current Outpatient Medications  Medication Sig Dispense Refill  . [START ON 09/13/2020] amphetamine-dextroamphetamine (ADDERALL) 20 MG tablet Take 1 tablet (20 mg total) by mouth 2 (two) times daily. 60 tablet 0  . Aspirin-Acetaminophen-Caffeine (GOODYS EXTRA STRENGTH PO) Take 1 packet by mouth daily as needed (pain).    Marland Kitchen azelastine (ASTELIN) 0.1 % nasal spray PLACE 2 SPRAYS INTO EACH NOSTRIL TWICE DAILY AS DIRECTED (Patient taking differently: Place 2 sprays into both nostrils 2 (two) times daily as needed for rhinitis.) 30 mL 11   . ibuprofen (ADVIL) 200 MG tablet Take 200 mg by mouth every 8 (eight) hours as needed for mild pain.    Marland Kitchen levothyroxine (SYNTHROID, LEVOTHROID) 88 MCG tablet Take 88 mcg by mouth daily before breakfast.  1  . magnesium 30 MG tablet Take 300 mg by mouth daily.     . Multiple Vitamin (MULTIVITAMIN) tablet Take 1 tablet by mouth daily.    . tamoxifen (NOLVADEX) 10 MG tablet Take 1 tablet (10 mg total) by mouth daily. 90 tablet 3  . temazepam (RESTORIL) 30 MG capsule TAKE 1 CAPSULE(30 MG) BY MOUTH AT BEDTIME AS NEEDED FOR SLEEP (Patient taking differently: Take 30 mg by mouth at bedtime as needed for sleep.) 30 capsule 5   No current facility-administered medications for this encounter.     REVIEW OF SYSTEMS: On review of systems, the patient reports she started having symptoms of covid with sinus drainage, runny nose and body aches on 07/14/20. She tested negative several times with rapid and PCR testing, but tested positive 07/22/20. She reports she has tested twice again and is now negative. She prefers to reschedule her appointment for this visit where she can be seen face to face.     PHYSICAL EXAM:  Wt Readings from Last 3 Encounters:  07/30/20 119 lb 6 oz (54.1 kg)  07/09/20 118 lb 11.2 oz (53.8 kg)  07/05/20 120 lb (54.4 kg)   Temp Readings from Last 3 Encounters:  07/30/20 (!) 96.8 F (36 C) (Temporal)  07/09/20 98.1 F (36.7 C) (Tympanic)  07/05/20 98 F (36.7 C)   BP Readings from Last 3 Encounters:  07/30/20 (!) 176/96  07/09/20 (!) 155/85  07/05/20 (!) 150/81   Pulse Readings from Last 3 Encounters:  07/30/20 88  07/09/20 (!) 107  07/05/20 72    The patient was not seen today in clinic but contacted by her cell phone to let her know that we could have the discussion by phone. She preferred to reschedule.    ECOG = 1  0 - Asymptomatic (Fully active, able to carry on all predisease activities without restriction)  1 - Symptomatic but completely ambulatory  (Restricted in physically strenuous activity but ambulatory and able to carry out work of a light or sedentary nature. For example, light housework, office work)  2 - Symptomatic, <50% in bed during the day (Ambulatory and capable of all self care but unable to carry out any work activities. Up and about more than 50% of waking hours)  3 - Symptomatic, >50% in bed, but not bedbound (Capable of only limited self-care, confined to bed or chair 50% or more of waking hours)  4 - Bedbound (Completely disabled. Cannot carry on any self-care. Totally confined to bed or chair)  5 - Death   Eustace Pen MM, Creech RH, Tormey DC, et al. [phone]). "Toxicity and response criteria of the [facility]".  [place] Oncol. 5 (6): 649-55    LABORATORY DATA:  Lab Results  Component Value Date   WBC 6.3 05/22/2020   HGB 11.5 (L) 05/22/2020   HCT 35.2 (L) 05/22/2020   MCV 92.9 05/22/2020   PLT 219 05/22/2020   Lab Results  Component Value Date   NA 141 05/22/2020   K 3.6 05/22/2020   CL 105 05/22/2020   CO2 30 05/22/2020   Lab Results  Component Value Date   ALT 28 05/22/2020   AST 27 05/22/2020   ALKPHOS 74 05/22/2020   BILITOT 0.7 05/22/2020      RADIOGRAPHY: NM Sentinel Node Inj-No Rpt (Breast)  Result Date: 07/02/2020 Sulfur colloid was injected by the nuclear medicine technologist for melanoma sentinel node.   MM Breast Surgical Specimen  Result Date: 07/02/2020 CLINICAL DATA:  Patient status post right breast lumpectomy. EXAM: SPECIMEN RADIOGRAPH OF THE RIGHT BREAST COMPARISON:  Previous exam(s). FINDINGS: Status post excision of the right breast. The radioactive seed and biopsy marker clip are present, completely intact, and were marked for pathology. IMPRESSION: Specimen radiograph of the right breast. Electronically Signed   By: Lovey Newcomer M.D.   On: 07/02/2020 11:34   MM RT RADIOACTIVE SEED LOC MAMMO GUIDE  Result Date: 07/01/2020 CLINICAL DATA:  60 year old  female for radioactive seed localization of RIGHT breast cancer prior to lumpectomy. Patient developed a large pseudoaneurysm/hematoma with possible AV fistula at the biopsy site. EXAM: MAMMOGRAPHIC GUIDED RADIOACTIVE SEED LOCALIZATION OF THE RIGHT BREAST COMPARISON:  Previous exam(s). FINDINGS: This case was discussed with Dr. Georgette Dover prior to the procedure. Due to the pseudoaneurysm/hematoma/possible fistula, the radioactive seed will be placed directly anterior to the COIL biopsy clip. The patient presents for radioactive seed localization prior to RIGHT lumpectomy. I met with the patient and we discussed the procedure of seed localization including benefits and alternatives. We discussed the high likelihood of a successful procedure. We discussed the risks of the procedure including infection, bleeding, tissue injury and further surgery. We discussed the low dose of radioactivity involved in the procedure. Informed, written consent was given. The usual time-out protocol was performed immediately prior to the procedure. Using mammographic guidance, sterile technique, 1% lidocaine and an I-125 radioactive seed, the COIL biopsy clip was localized using a SUPERIOR approach. The follow-up mammogram images confirm the seed in the expected location 0.4 cm anterior to the COIL clip and were marked for Dr. Georgette Dover. Follow-up survey of the patient confirms presence of the radioactive seed. Order number of I-125 seed:  [id number]. Total activity:  7.939 millicuries.  Reference Date: 06/03/2020. The patient tolerated the procedure well and was released from the [facility]. She was given instructions regarding seed removal. IMPRESSION: Radioactive seed localization RIGHT breast. Due to the patient's pseudoaneurysm/hematoma, the radioactive seed was placed directly anterior to the COIL biopsy clip, separated by a distance of 0.4 cm. No apparent complications. Electronically Signed   By: Margarette Canada M.D.   On: 07/01/2020 14:16        IMPRESSION/PLAN: 1. ER positive high-grade DCIS of the right breast. The patient requests to reschedule her visit to discuss her breast cancer. She will be contacted by scheduling to reschedule this.  2. Covid 19 positive. The patient tested positive on 07/22/20. She is past the window to offer monoclonal antibodies. We discussed that the cancer center is preferring to wait until 10 days after someone tests positive before they come into the cancer cancer. We discussed by phone that while  she is past the "isolation" phase per CDC since she is fully vaccinated, she still needs to wear a mask when out and about until at least 10 days. I recommend she continue wearing a mask in public regardless. She is in agreement and we will reschedule her appointment.  In a visit lasting 35 minutes, greater than 50% of the time was spent by phone, but also in  reviewing her case, as well as in preparation of, and coordinating the patient's care.      Carola Rhine, PAC

## 2020-07-30 NOTE — Progress Notes (Signed)
New Breast Cancer Diagnosis: Right Breast UOQ  Did patient present with symptoms (if so, please note symptoms) or screening mammography?:Screening Calcifications    Location and Extent of disease :right breast. 1 cm group of calcifications UOQ. Adenopathy No  Histology per Pathology Report: Right Breast 07/02/2020   Receptor Status: ER(positive), PR (negative), Her2-neu (), Ki-(%)  Surgeon and surgical plan, if any: Dr. Georgette Dover -07/02/2020: Right breast lumpectomy with radioactive seed and right axillary SLN biopsy. -07/05/2020: Evacuation of right breast hematoma -Follow-up 07/29/2020.  Medical oncologist, treatment if any:    Lymphedema issues, if any: No  Pain issues, if any:  Has some mild tenderness/soreness  SAFETY ISSUES: Prior radiation? No Pacemaker/ICD? No Possible current pregnancy? Postmenopausal Is the patient on methotrexate? No  Current Complaints / other details:

## 2020-07-30 NOTE — Addendum Note (Signed)
Encounter addended by: Cori Razor, RN on: 07/30/2020 11:52 AM  Actions taken: Charge Capture section accepted

## 2020-08-01 ENCOUNTER — Ambulatory Visit: Payer: 59 | Admitting: Radiation Oncology

## 2020-08-01 ENCOUNTER — Ambulatory Visit: Payer: 59 | Attending: Surgery | Admitting: Physical Therapy

## 2020-08-01 ENCOUNTER — Other Ambulatory Visit: Payer: Self-pay

## 2020-08-01 ENCOUNTER — Encounter: Payer: Self-pay | Admitting: Physical Therapy

## 2020-08-01 DIAGNOSIS — C50411 Malignant neoplasm of upper-outer quadrant of right female breast: Secondary | ICD-10-CM | POA: Diagnosis present

## 2020-08-01 DIAGNOSIS — Z483 Aftercare following surgery for neoplasm: Secondary | ICD-10-CM | POA: Insufficient documentation

## 2020-08-01 DIAGNOSIS — R293 Abnormal posture: Secondary | ICD-10-CM | POA: Diagnosis present

## 2020-08-01 DIAGNOSIS — Z170 Estrogen receptor positive status [ER+]: Secondary | ICD-10-CM | POA: Insufficient documentation

## 2020-08-01 NOTE — Patient Instructions (Signed)
            [facility]         [address], [place] [address]         [phone]         Annia Friendly, PT, CLT   After Breast Cancer Class It is recommended you attend the ABC class to be educated on lymphedema risk reduction. This class is free of charge and lasts for 1 hour. It is a 1-time class.  You are scheduled for February 21st at 11:00 and we will send you a link through your e-mail.  Scar massage You can begin scar massage with Vitamin E cream or coconut oil for a few minutes each day.   Compression garment Wearing a good sports bra will help reduce your right breast swelling.   Home exercise Program You can continue doing your home exercises during radiation so you don't lose any mobility.   Follow up PT: It is recommended you return every 3 months for the first 3 years following surgery to be assessed on the SOZO machine for an L-Dex score. This helps prevent clinically significant lymphedema in 95% of patients. These follow up screens are 15 minute appointments that you are not billed for. You are scheduled for September 30, 2020 at 2:00.

## 2020-08-01 NOTE — Therapy (Signed)
[place], [place], [address] Phone: [phone]   Fax:  [phone]  Physical Therapy Treatment  Patient Details  Name: Sarah Hall MRN: [id number] Date of Birth: [birth year] Referring Provider (PT): Dr. Donnie Mesa   Encounter Date: 08/01/2020   PT End of Session - 08/01/20 0942    Visit Number 2    Number of Visits 2    PT Start Time 0902    PT Stop Time 0945    PT Time Calculation (min) 43 min    Activity Tolerance Patient tolerated treatment well    Behavior During Therapy [facility] for tasks assessed/performed           Past Medical History:  Diagnosis Date  . ADHD (attention deficit hyperactivity disorder), inattentive type   . Ankle sprain    right ankle  . Breast cancer ([place])   . Deep breathing    hard to take a deep breath due to thyroid issue  . Depression    Bipolar depression, sees Metta Clines NP   . Hypothyroidism    cared for by Dr. Ronita Hipps     Past Surgical History:  Procedure Laterality Date  . BREAST CYST EXCISION Right 07/05/2020   Procedure: EVACUATION OF RIGHT BREAST HEMATOMA;  Surgeon: Donnie Mesa, MD;  Location: [facility];  Service: General;  Laterality: Right;  LMA  . BREAST LUMPECTOMY WITH RADIOACTIVE SEED AND SENTINEL LYMPH NODE BIOPSY Right 07/02/2020   Procedure: RIGHT BREAST LUMPECTOMY WITH RADIOACTIVE SEED AND RIGHT AXILLARY SENTINEL LYMPH NODE BIOPSY, BLUE DYE INJECTION;  Surgeon: Donnie Mesa, MD;  Location: [facility];  Service: General;  Laterality: Right;  PEC BLOCK, BLUE DYE INJECTION  . COLONOSCOPY  09/03/2014   per Dr. Fuller Plan, benign polyps, repeat in 10 yrs   . DILATION AND EVACUATION      2 times  . scraping of uterus     20 years ago  . WISDOM TOOTH EXTRACTION      There were no vitals filed for this visit.   Subjective Assessment - 08/01/20 0905    Subjective Patient reports she underwent a right lumpectomy and sentinel node  biopsy on 07/02/2020 with 2 negative nodes removed. She has a radiation consult next week but will not start radiation until after her trip to [place] next month for 20 treatments.    Pertinent History Patient was diagnosed on 05/15/2020 with right DCIS. She underwent a right lumpectomy and sentinel node biopsy on 07/02/2020 with 2 negative nodes removed. It is ER positive and PR negative.    Patient Stated Goals Make sure my arm is back to baseline and I can start exercising    Currently in Pain? No/denies              [facility] PT Assessment - 08/01/20 0001      Assessment   Medical Diagnosis s/p right lumpectomy and SLNB    Referring Provider (PT) Dr. Donnie Mesa    Onset Date/Surgical Date 07/02/20    Hand Dominance Right    Prior Therapy Baselines      Precautions   Precautions Other (comment)    Precaution Comments right arm lymphedema      Restrictions   Weight Bearing Restrictions No      Balance Screen   Has the patient fallen in the past 6 months No    Has the patient had a decrease in activity level because of a fear of falling?  No  Is the patient reluctant to leave their home because of a fear of falling?  No      Home Environment   Living Environment Private residence    Living Arrangements Children   43 y.o. son   Available Help at Discharge Family      Prior Function   Level of [place] Retired    Leisure Plans to return to the gym today      Cognition   Overall Cognitive Status Within Functional Limits for tasks assessed      Observation/Other Assessments   Observations Right axillary and breast incision both appear to be healing well. She has some mild fibrotic edema present in right lateral breast. No redness or signs of infection noted.      Posture/Postural Control   Posture/Postural Control Postural limitations    Postural Limitations Rounded Shoulders;Forward head      ROM / Strength   AROM / PROM / Strength AROM       AROM   AROM Assessment Site Shoulder    Right/Left Shoulder Right    Right Shoulder Extension 60 Degrees    Right Shoulder Flexion 150 Degrees    Right Shoulder ABduction 173 Degrees    Right Shoulder Internal Rotation 68 Degrees    Right Shoulder External Rotation 90 Degrees      Strength   Overall Strength Within functional limits for tasks performed             LYMPHEDEMA/ONCOLOGY QUESTIONNAIRE - 08/01/20 0001      Type   Cancer Type Right breast cancer      Surgeries   Lumpectomy Date 07/02/20    Sentinel Lymph Node Biopsy Date 07/02/20    Number Lymph Nodes Removed 2      Treatment   Active Chemotherapy Treatment No    Past Chemotherapy Treatment No    Active Radiation Treatment No    Past Radiation Treatment No    Current Hormone Treatment Yes    Drug Name Tamoxifen    Past Hormone Therapy No      What other symptoms do you have   Are you Having Heaviness or Tightness No    Are you having Pain No    Are you having pitting edema No    Is it Hard or Difficult finding clothes that fit No    Do you have infections No    Is there Decreased scar mobility No    [place] Sign No      Lymphedema Assessments   Lymphedema Assessments Upper extremities      Right Upper Extremity Lymphedema   10 cm Proximal to Olecranon Process 24.7 cm    Olecranon Process 22.6 cm    10 cm Proximal to Ulnar Styloid Process 18.8 cm    Just Proximal to Ulnar Styloid Process 13.6 cm    Across Hand at [employer] 17.2 cm    At [place] of 2nd Digit 5.8 cm      Left Upper Extremity Lymphedema   10 cm Proximal to Olecranon Process 24.3 cm    Olecranon Process 21.7 cm    10 cm Proximal to Ulnar Styloid Process 17.8 cm    Just Proximal to Ulnar Styloid Process 13.8 cm    Across Hand at [employer] 16.8 cm    At [place] of 2nd Digit 5.4 cm              Quick Dash - 08/01/20  0001    Open a tight or new jar No difficulty    Do heavy household chores (wash walls, wash floors)  No difficulty    Carry a shopping bag or briefcase No difficulty    Wash your back No difficulty    Use a knife to cut food No difficulty    Recreational activities in which you take some force or impact through your arm, shoulder, or hand (golf, hammering, tennis) No difficulty    During the past week, to what extent has your arm, shoulder or hand problem interfered with your normal social activities with family, friends, neighbors, or groups? Not at all    During the past week, to what extent has your arm, shoulder or hand problem limited your work or other regular daily activities Not at all    Arm, shoulder, or hand pain. Mild    Tingling (pins and needles) in your arm, shoulder, or hand Mild    Difficulty Sleeping No difficulty    DASH Score 4.55 %                          PT Education - 08/01/20 0941    Education Details Aftercare; scar massage; compression bra    Person(s) Educated Patient    Methods Explanation;Demonstration    Comprehension Returned demonstration;Verbalized understanding               PT Long Term Goals - 08/01/20 0950      PT LONG TERM GOAL #1   Title Patient will demonstrate she has regained full shoulder ROM and function post operatively compared to baselines.    Time 8    Period Weeks    Status Achieved                 Plan - 08/01/20 0948    Clinical Impression Statement Patient is doing very well s/p right lumpectomy and sentinel node biopsy on 07/02/2020. She will undergo radiation in March after a trip to [place]. Her shoulder ROM and function is back to baseline, she has no signs of lymphedema, and her incisions are well healed. She has some mild edema with fibrosis noted in her right lateral breast but it appears to be normal post surgical swelling. She will benefit from participating in the After Breast Cancer class for lymphedema educaiton and from coming for L_Dex screens every 3 months for 2 years to detect subclinical  lymphedema. Otherwise she has no PT needs a tthis time.    PT Treatment/Interventions ADLs/Self Care Home Management;Therapeutic exercise;Patient/family education    PT Next Visit Plan D/C    PT Home Exercise Plan Post op shoulder ROM HEP    Consulted and Agree with Plan of Care Patient           Patient will benefit from skilled therapeutic intervention in order to improve the following deficits and impairments:  Postural dysfunction,Decreased range of motion,Decreased knowledge of precautions,Impaired UE functional use,Pain  Visit Diagnosis: Malignant neoplasm of upper-outer quadrant of right breast in female, estrogen receptor positive ([place])  Abnormal posture  Aftercare following surgery for neoplasm     Problem List Patient Active Problem List   Diagnosis Date Noted  . Ductal carcinoma in situ (DCIS) of right breast 05/20/2020  . Acquired hypothyroidism 06/09/2018  . Seasonal allergies 06/09/2018  . Depression, recurrent ([place]) 06/09/2018  . Insomnia 01/15/2015  . Low back pain 08/20/2014  . Allergic rhinitis 02/22/2014  . ADD (  attention deficit disorder) 12/31/2011  . Perimenopausal vasomotor symptoms 12/31/2011  . Fibrocystic breast disease 04/14/2011   PHYSICAL THERAPY DISCHARGE SUMMARY  Visits from Start of Care: 2  Current functional level related to goals / functional outcomes: Goals met. See above for objective findings.   Remaining deficits: Mild edema right breast   Education / Equipment: HEP and lymphedema education Plan:                                                    Patient goals were met. Patient is being discharged due to meeting the stated rehab goals.  ?????         Annia Friendly, [place] 08/01/20 9:51 AM  [place], [place], [address] Phone: [phone]   Fax:  [phone]  Name: Rainee M Hall MRN: [id number] Date of Birth: 02-Jun-1961

## 2020-08-05 ENCOUNTER — Encounter: Payer: Self-pay | Admitting: *Deleted

## 2020-08-15 ENCOUNTER — Encounter: Payer: Self-pay | Admitting: Radiation Oncology

## 2020-08-15 ENCOUNTER — Ambulatory Visit
Admission: RE | Admit: 2020-08-15 | Discharge: 2020-08-15 | Disposition: A | Discharge status: Home / Self Care | Payer: 59 | Source: Ambulatory Visit | Attending: Radiation Oncology | Admitting: Radiation Oncology

## 2020-08-15 ENCOUNTER — Other Ambulatory Visit: Payer: Self-pay

## 2020-08-15 VITALS — BP 143/83 | HR 82 | Resp 18 | Wt 119.1 lb

## 2020-08-15 DIAGNOSIS — Z7982 Long term (current) use of aspirin: Secondary | ICD-10-CM | POA: Insufficient documentation

## 2020-08-15 DIAGNOSIS — E039 Hypothyroidism, unspecified: Secondary | ICD-10-CM | POA: Diagnosis not present

## 2020-08-15 DIAGNOSIS — D0511 Intraductal carcinoma in situ of right breast: Secondary | ICD-10-CM | POA: Diagnosis present

## 2020-08-15 DIAGNOSIS — Z170 Estrogen receptor positive status [ER+]: Secondary | ICD-10-CM | POA: Diagnosis not present

## 2020-08-15 DIAGNOSIS — Z79899 Other long term (current) drug therapy: Secondary | ICD-10-CM | POA: Diagnosis not present

## 2020-08-15 DIAGNOSIS — F329 Major depressive disorder, single episode, unspecified: Secondary | ICD-10-CM | POA: Diagnosis not present

## 2020-08-15 DIAGNOSIS — Z803 Family history of malignant neoplasm of breast: Secondary | ICD-10-CM | POA: Insufficient documentation

## 2020-08-15 DIAGNOSIS — F909 Attention-deficit hyperactivity disorder, unspecified type: Secondary | ICD-10-CM | POA: Insufficient documentation

## 2020-08-15 NOTE — Progress Notes (Signed)
Radiation Oncology         [phone]) [phone] ________________________________  Name: Sarah Hall        MRN: [id number]  Date of Service: 08/15/2020 DOB: 17-Sep-1960  CC:Fry, Ishmael Holter, MD  Nicholas Lose, MD     REFERRING PHYSICIAN: Nicholas Lose, MD   DIAGNOSIS: The encounter diagnosis was Ductal carcinoma in situ (DCIS) of right breast.   HISTORY OF PRESENT ILLNESS: Sarah Hall is a 60 y.o. female originally seen in the multidisciplinary breast clinic for a new diagnosis of right breast cancer. The patient was noted to have screening detected calcifications in the right breast, a 1 cm group of calcifications was seen in the upper outer quadrant by diagnostic imaging, her axilla was negative for adenopathy, and a biopsy on 05/14/2020 revealed high-grade DCIS with necrosis and calcifications with focal suspicion for microinvasion.  Her cancer was ER positive PR negative.  She did develop a 7 cm hematoma and pseudoaneurysm within the breast following her biopsy. She ultimately underwent right lumpectomy on 07/02/2020, final pathology revealed high-grade DCIS measuring a total of 1.5 cm with necrosis and calcifications, her margins were clear of carcinoma, and 2 sampled lymph nodes were clear as well.  Unfortunately she did develop another hematoma that had to be evacuated surgically on 07/05/2020.  She had to be rescheduled due to covid positivity a few weeks ago but is here today to discuss adjuvant radiotherapy treatment.   PREVIOUS RADIATION THERAPY: No   PAST MEDICAL HISTORY:  Past Medical History:  Diagnosis Date   ADHD (attention deficit hyperactivity disorder), inattentive type    Ankle sprain    right ankle   Breast cancer ([place])    Deep breathing    hard to take a deep breath due to thyroid issue   Depression    Bipolar depression, sees Metta Clines NP    Hypothyroidism    cared for by Dr. Ronita Hipps        PAST SURGICAL HISTORY: Past Surgical History:  Procedure  Laterality Date   BREAST CYST EXCISION Right 07/05/2020   Procedure: EVACUATION OF RIGHT BREAST HEMATOMA;  Surgeon: Donnie Mesa, MD;  Location: [place];  Service: General;  Laterality: Right;  LMA   BREAST LUMPECTOMY WITH RADIOACTIVE SEED AND SENTINEL LYMPH NODE BIOPSY Right 07/02/2020   Procedure: RIGHT BREAST LUMPECTOMY WITH RADIOACTIVE SEED AND RIGHT AXILLARY SENTINEL LYMPH NODE BIOPSY, BLUE DYE INJECTION;  Surgeon: Donnie Mesa, MD;  Location: [place];  Service: General;  Laterality: Right;  PEC BLOCK, BLUE DYE INJECTION   COLONOSCOPY  09/03/2014   per Dr. Fuller Plan, benign polyps, repeat in 10 yrs    [place]      2 times   scraping of uterus     20 years ago   WISDOM TOOTH EXTRACTION       FAMILY HISTORY:  Family History  Problem Relation Age of Onset   Alzheimer's disease Father    Breast cancer Paternal Grandmother    Colon cancer Maternal Uncle    Uterine cancer Maternal Grandmother    Esophageal cancer Neg Hx    Rectal cancer Neg Hx    Stomach cancer Neg Hx      SOCIAL HISTORY:  reports that she has never smoked. She has never used smokeless tobacco. She reports current alcohol use of about 10.0 standard drinks of alcohol per week. She reports that she does not use drugs.  The patient is separated and lives in [place].  She has a teenage son who is waiting to hear back about college applications.   ALLERGIES: Codeine and Codeine phosphate   MEDICATIONS:  Current Outpatient Medications  Medication Sig Dispense Refill   [START ON 09/13/2020] amphetamine-dextroamphetamine (ADDERALL) 20 MG tablet Take 1 tablet (20 mg total) by mouth 2 (two) times daily. 60 tablet 0   Aspirin-Acetaminophen-Caffeine (GOODYS EXTRA STRENGTH PO) Take 1 packet by mouth daily as needed (pain).     azelastine (ASTELIN) 0.1 % nasal spray PLACE 2 SPRAYS INTO EACH NOSTRIL TWICE DAILY AS DIRECTED (Patient taking differently: Place 2 sprays into both  nostrils 2 (two) times daily as needed for rhinitis.) 30 mL 11   levothyroxine (SYNTHROID, LEVOTHROID) 88 MCG tablet Take 88 mcg by mouth daily before breakfast.  1   magnesium 30 MG tablet Take 300 mg by mouth daily.      Multiple Vitamin (MULTIVITAMIN) tablet Take 1 tablet by mouth daily.     tamoxifen (NOLVADEX) 10 MG tablet Take 1 tablet (10 mg total) by mouth daily. 90 tablet 3   temazepam (RESTORIL) 30 MG capsule TAKE 1 CAPSULE(30 MG) BY MOUTH AT BEDTIME AS NEEDED FOR SLEEP (Patient taking differently: Take 30 mg by mouth at bedtime as needed for sleep.) 30 capsule 5   ibuprofen (ADVIL) 200 MG tablet Take 200 mg by mouth every 8 (eight) hours as needed for mild pain. (Patient not taking: Reported on 08/15/2020)     No current facility-administered medications for this encounter.     REVIEW OF SYSTEMS: On review of systems, the patient reports she is doing well since her surgery to remove the cancer then followed by her surgery to evacuate the hematoma.  She did have a small seroma that has since resolved per her report.  She has had some tenderness in the back of the upper arm from the axillary area following her lymph node excision.  No other complaints are verbalized.    PHYSICAL EXAM:  Wt Readings from Last 3 Encounters:  08/15/20 119 lb 2 oz (54 kg)  07/30/20 119 lb 6 oz (54.1 kg)  07/09/20 118 lb 11.2 oz (53.8 kg)   Temp Readings from Last 3 Encounters:  07/30/20 (!) 96.8 F (36 C) (Temporal)  07/09/20 98.1 F (36.7 C) (Tympanic)  07/05/20 98 F (36.7 C)   BP Readings from Last 3 Encounters:  08/15/20 (!) 143/83  07/30/20 (!) 176/96  07/09/20 (!) 155/85   Pulse Readings from Last 3 Encounters:  08/15/20 82  07/30/20 88  07/09/20 (!) 107    In general this is a well appearing caucasian female in no acute distress. She's alert and oriented x4 and appropriate throughout the examination. Cardiopulmonary assessment is negative for acute distress and she exhibits  normal effort.  Breast exam is deferred to simulation.  ECOG = 1  0 - Asymptomatic (Fully active, able to carry on all predisease activities without restriction)  1 - Symptomatic but completely ambulatory (Restricted in physically strenuous activity but ambulatory and able to carry out work of a light or sedentary nature. For example, light housework, office work)  2 - Symptomatic, <50% in bed during the day (Ambulatory and capable of all self care but unable to carry out any work activities. Up and about more than 50% of waking hours)  3 - Symptomatic, >50% in bed, but not bedbound (Capable of only limited self-care, confined to bed or chair 50% or more of waking hours)  4 - Bedbound (Completely disabled. Cannot carry on  any self-care. Totally confined to bed or chair)  5 - Death   Eustace Pen MM, Creech RH, Tormey DC, et al. [phone]). "Toxicity and response criteria of the [facility] Group". [place] Oncol. 5 (6): 649-55    LABORATORY DATA:  Lab Results  Component Value Date   WBC 6.3 05/22/2020   HGB 11.5 (L) 05/22/2020   HCT 35.2 (L) 05/22/2020   MCV 92.9 05/22/2020   PLT 219 05/22/2020   Lab Results  Component Value Date   NA 141 05/22/2020   K 3.6 05/22/2020   CL 105 05/22/2020   CO2 30 05/22/2020   Lab Results  Component Value Date   ALT 28 05/22/2020   AST 27 05/22/2020   ALKPHOS 74 05/22/2020   BILITOT 0.7 05/22/2020      RADIOGRAPHY: No results found.     IMPRESSION/PLAN: 1. ER positive high-grade DCIS of the right breast. Dr. Lisbeth Renshaw discusses the final pathology findings and reviews the nature of noninvasive right breast disease. She is healing well since surgery and is ready to proceed external radiotherapy to the breast  to reduce risks of local recurrence followed by antiestrogen therapy. We discussed the risks, benefits, short, and long term effects of radiotherapy, as well as the curative intent, and the patient is interested in proceeding.  Dr. Lisbeth Renshaw recommends  4 weeks of radiotherapy to the right breast. Written consent is obtained and placed in the chart, a copy was provided to the patient. She will simulate on 08/20/20.   In a visit lasting 45 minutes, greater than 50% of the time was spent face to face discussing the patient's condition, in preparation for the discussion, and coordinating the patient's care.   The above documentation reflects my direct findings during this shared patient visit. Please see the separate note by Dr. Lisbeth Renshaw on this date for the remainder of the patient's plan of care.     Carola Rhine, PAC

## 2020-08-20 ENCOUNTER — Other Ambulatory Visit: Payer: Self-pay

## 2020-08-20 ENCOUNTER — Ambulatory Visit
Admission: RE | Admit: 2020-08-20 | Discharge: 2020-08-20 | Disposition: A | Discharge status: Home / Self Care | Payer: 59 | Source: Ambulatory Visit | Attending: Radiation Oncology | Admitting: Radiation Oncology

## 2020-08-20 DIAGNOSIS — Z170 Estrogen receptor positive status [ER+]: Secondary | ICD-10-CM | POA: Diagnosis not present

## 2020-08-20 DIAGNOSIS — Z510 Encounter for antineoplastic radiation therapy: Secondary | ICD-10-CM | POA: Insufficient documentation

## 2020-08-20 DIAGNOSIS — D0511 Intraductal carcinoma in situ of right breast: Secondary | ICD-10-CM | POA: Insufficient documentation

## 2020-08-26 ENCOUNTER — Encounter: Payer: Self-pay | Admitting: *Deleted

## 2020-08-27 DIAGNOSIS — Z510 Encounter for antineoplastic radiation therapy: Secondary | ICD-10-CM | POA: Diagnosis not present

## 2020-08-28 ENCOUNTER — Ambulatory Visit
Admission: RE | Admit: 2020-08-28 | Discharge: 2020-08-28 | Disposition: A | Discharge status: Home / Self Care | Payer: 59 | Source: Ambulatory Visit | Attending: Radiation Oncology | Admitting: Radiation Oncology

## 2020-08-28 DIAGNOSIS — Z510 Encounter for antineoplastic radiation therapy: Secondary | ICD-10-CM | POA: Diagnosis not present

## 2020-08-29 ENCOUNTER — Ambulatory Visit
Admission: RE | Admit: 2020-08-29 | Discharge: 2020-08-29 | Disposition: A | Discharge status: Home / Self Care | Payer: 59 | Source: Ambulatory Visit | Attending: Radiation Oncology | Admitting: Radiation Oncology

## 2020-08-29 DIAGNOSIS — Z510 Encounter for antineoplastic radiation therapy: Secondary | ICD-10-CM | POA: Diagnosis not present

## 2020-08-29 NOTE — Progress Notes (Signed)
Pt here for patient teaching.  Pt given Radiation and You booklet, skin care instructions, Alra deodorant and Radiaplex gel.  Reviewed areas of pertinence such as fatigue, hair loss, skin changes, breast tenderness and breast swelling . Pt able to give teach back of to pat skin and use unscented/gentle soap,apply Radiaplex bid, avoid applying anything to skin within 4 hours of treatment, avoid wearing an under wire bra and to use an electric razor if they must shave. Pt verbalizes understanding of information given and will contact nursing with any questions or concerns.     Yazmyne Sara M. Chantry Headen RN, BSN      

## 2020-08-30 ENCOUNTER — Other Ambulatory Visit: Payer: Self-pay

## 2020-08-30 ENCOUNTER — Ambulatory Visit
Admission: RE | Admit: 2020-08-30 | Discharge: 2020-08-30 | Disposition: A | Discharge status: Home / Self Care | Payer: 59 | Source: Ambulatory Visit | Attending: Radiation Oncology | Admitting: Radiation Oncology

## 2020-08-30 DIAGNOSIS — Z510 Encounter for antineoplastic radiation therapy: Secondary | ICD-10-CM | POA: Diagnosis not present

## 2020-08-30 DIAGNOSIS — D0511 Intraductal carcinoma in situ of right breast: Secondary | ICD-10-CM

## 2020-08-30 MED ORDER — ALRA NON-METALLIC DEODORANT (RAD-ONC)
1.0000 "application " | Freq: Once | TOPICAL | Status: AC
  Administered 2020-08-30: 1 via TOPICAL

## 2020-08-30 MED ORDER — RADIAPLEXRX EX GEL: Freq: Once | CUTANEOUS | Status: AC

## 2020-09-02 ENCOUNTER — Ambulatory Visit: Payer: 59 | Admitting: Physical Therapy

## 2020-09-02 ENCOUNTER — Ambulatory Visit
Admission: RE | Admit: 2020-09-02 | Discharge: 2020-09-02 | Disposition: A | Discharge status: Home / Self Care | Payer: 59 | Source: Ambulatory Visit | Attending: Radiation Oncology | Admitting: Radiation Oncology

## 2020-09-02 DIAGNOSIS — Z510 Encounter for antineoplastic radiation therapy: Secondary | ICD-10-CM | POA: Diagnosis not present

## 2020-09-03 ENCOUNTER — Ambulatory Visit
Admission: RE | Admit: 2020-09-03 | Discharge: 2020-09-03 | Disposition: A | Discharge status: Home / Self Care | Payer: 59 | Source: Ambulatory Visit | Attending: Radiation Oncology | Admitting: Radiation Oncology

## 2020-09-03 ENCOUNTER — Encounter: Payer: Self-pay | Admitting: Radiation Oncology

## 2020-09-03 DIAGNOSIS — Z510 Encounter for antineoplastic radiation therapy: Secondary | ICD-10-CM | POA: Diagnosis not present

## 2020-09-04 ENCOUNTER — Ambulatory Visit
Admission: RE | Admit: 2020-09-04 | Discharge: 2020-09-04 | Disposition: A | Discharge status: Home / Self Care | Payer: 59 | Source: Ambulatory Visit | Attending: Radiation Oncology | Admitting: Radiation Oncology

## 2020-09-04 ENCOUNTER — Telehealth: Payer: Self-pay | Admitting: Radiation Oncology

## 2020-09-04 DIAGNOSIS — Z510 Encounter for antineoplastic radiation therapy: Secondary | ICD-10-CM | POA: Diagnosis not present

## 2020-09-04 NOTE — Telephone Encounter (Signed)
Pt clld saying that she rec'd a denial from [facility] and would like more information. I have given this message to [facility] who said that she will return pt call.

## 2020-09-05 ENCOUNTER — Ambulatory Visit
Admission: RE | Admit: 2020-09-05 | Discharge: 2020-09-05 | Disposition: A | Discharge status: Home / Self Care | Payer: 59 | Source: Ambulatory Visit | Attending: Radiation Oncology | Admitting: Radiation Oncology

## 2020-09-05 ENCOUNTER — Other Ambulatory Visit: Payer: Self-pay

## 2020-09-05 ENCOUNTER — Telehealth: Payer: Self-pay | Admitting: *Deleted

## 2020-09-05 DIAGNOSIS — Z510 Encounter for antineoplastic radiation therapy: Secondary | ICD-10-CM | POA: Diagnosis not present

## 2020-09-05 NOTE — Telephone Encounter (Signed)
Pt called concerned about xrt coverage with her insurance d/t letter received regarding the type of xrt being treated with. Pt asked if she should quit xrt. Informed pt msg sent to [facility] as well as to Dr. Lisbeth Renshaw and Bryson Ha, [place]. Informed pt not to quit xrt as Dr. Ida Rogue office will further look into the denial. Received verbal understanding.

## 2020-09-06 ENCOUNTER — Other Ambulatory Visit: Payer: Self-pay

## 2020-09-06 ENCOUNTER — Ambulatory Visit
Admission: RE | Admit: 2020-09-06 | Discharge: 2020-09-06 | Disposition: A | Discharge status: Home / Self Care | Payer: 59 | Source: Ambulatory Visit | Attending: Radiation Oncology | Admitting: Radiation Oncology

## 2020-09-06 DIAGNOSIS — Z510 Encounter for antineoplastic radiation therapy: Secondary | ICD-10-CM | POA: Diagnosis not present

## 2020-09-06 NOTE — Progress Notes (Signed)
  Radiation Oncology         [phone]) [phone] ________________________________  Name: Sarah Hall MRN: [id number]  Date: 09/03/2020  DOB: [birth year]  SIMULATION NOTE   NARRATIVE:  The patient underwent simulation today for ongoing radiation therapy.  The existing CT study set was employed for the purpose of virtual treatment planning.  The target and avoidance structures were reviewed and modified as necessary.  Treatment planning then occurred.  The radiation boost prescription was entered and confirmed.  A total of 3 complex treatment devices were fabricated in the form of multi-leaf collimators to shape radiation around the targets while maximally excluding nearby normal structures. I have requested : Isodose Plan.    PLAN:  This modified radiation beam arrangement is intended to continue the current radiation dose to an additional 8 Gy in 4 fractions for a total cumulative dose of 50.56 Gy.    ------------------------------------------------  Jodelle Gross, MD, PhD

## 2020-09-09 ENCOUNTER — Other Ambulatory Visit: Payer: Self-pay

## 2020-09-09 ENCOUNTER — Ambulatory Visit
Admission: RE | Admit: 2020-09-09 | Discharge: 2020-09-09 | Disposition: A | Discharge status: Home / Self Care | Payer: 59 | Source: Ambulatory Visit | Attending: Radiation Oncology | Admitting: Radiation Oncology

## 2020-09-09 DIAGNOSIS — Z510 Encounter for antineoplastic radiation therapy: Secondary | ICD-10-CM | POA: Diagnosis not present

## 2020-09-10 ENCOUNTER — Ambulatory Visit: Payer: 59

## 2020-09-11 ENCOUNTER — Other Ambulatory Visit: Payer: Self-pay

## 2020-09-11 ENCOUNTER — Ambulatory Visit
Admission: RE | Admit: 2020-09-11 | Discharge: 2020-09-11 | Disposition: A | Discharge status: Home / Self Care | Payer: 59 | Source: Ambulatory Visit | Attending: Radiation Oncology | Admitting: Radiation Oncology

## 2020-09-11 DIAGNOSIS — Z510 Encounter for antineoplastic radiation therapy: Secondary | ICD-10-CM | POA: Diagnosis not present

## 2020-09-12 ENCOUNTER — Ambulatory Visit
Admission: RE | Admit: 2020-09-12 | Discharge: 2020-09-12 | Disposition: A | Discharge status: Home / Self Care | Payer: 59 | Source: Ambulatory Visit | Attending: Radiation Oncology | Admitting: Radiation Oncology

## 2020-09-12 DIAGNOSIS — Z510 Encounter for antineoplastic radiation therapy: Secondary | ICD-10-CM | POA: Diagnosis not present

## 2020-09-13 ENCOUNTER — Other Ambulatory Visit: Payer: Self-pay

## 2020-09-13 ENCOUNTER — Ambulatory Visit
Admission: RE | Admit: 2020-09-13 | Discharge: 2020-09-13 | Disposition: A | Discharge status: Home / Self Care | Payer: 59 | Source: Ambulatory Visit | Attending: Radiation Oncology | Admitting: Radiation Oncology

## 2020-09-13 DIAGNOSIS — Z510 Encounter for antineoplastic radiation therapy: Secondary | ICD-10-CM | POA: Diagnosis not present

## 2020-09-16 ENCOUNTER — Other Ambulatory Visit: Payer: Self-pay

## 2020-09-16 ENCOUNTER — Ambulatory Visit
Admission: RE | Admit: 2020-09-16 | Discharge: 2020-09-16 | Disposition: A | Discharge status: Home / Self Care | Payer: 59 | Source: Ambulatory Visit | Attending: Radiation Oncology | Admitting: Radiation Oncology

## 2020-09-16 DIAGNOSIS — Z510 Encounter for antineoplastic radiation therapy: Secondary | ICD-10-CM | POA: Diagnosis not present

## 2020-09-17 ENCOUNTER — Ambulatory Visit
Admission: RE | Admit: 2020-09-17 | Discharge: 2020-09-17 | Disposition: A | Discharge status: Home / Self Care | Payer: 59 | Source: Ambulatory Visit | Attending: Radiation Oncology | Admitting: Radiation Oncology

## 2020-09-17 DIAGNOSIS — Z510 Encounter for antineoplastic radiation therapy: Secondary | ICD-10-CM | POA: Diagnosis not present

## 2020-09-18 ENCOUNTER — Ambulatory Visit
Admission: RE | Admit: 2020-09-18 | Discharge: 2020-09-18 | Disposition: A | Discharge status: Home / Self Care | Payer: 59 | Source: Ambulatory Visit | Attending: Radiation Oncology | Admitting: Radiation Oncology

## 2020-09-18 ENCOUNTER — Other Ambulatory Visit: Payer: Self-pay

## 2020-09-18 DIAGNOSIS — Z510 Encounter for antineoplastic radiation therapy: Secondary | ICD-10-CM | POA: Diagnosis not present

## 2020-09-19 ENCOUNTER — Ambulatory Visit
Admission: RE | Admit: 2020-09-19 | Discharge: 2020-09-19 | Disposition: A | Discharge status: Home / Self Care | Payer: 59 | Source: Ambulatory Visit | Attending: Radiation Oncology | Admitting: Radiation Oncology

## 2020-09-19 ENCOUNTER — Ambulatory Visit: Payer: 59

## 2020-09-19 DIAGNOSIS — Z510 Encounter for antineoplastic radiation therapy: Secondary | ICD-10-CM | POA: Diagnosis not present

## 2020-09-20 ENCOUNTER — Other Ambulatory Visit: Payer: Self-pay

## 2020-09-20 ENCOUNTER — Ambulatory Visit
Admission: RE | Admit: 2020-09-20 | Discharge: 2020-09-20 | Disposition: A | Discharge status: Home / Self Care | Payer: 59 | Source: Ambulatory Visit | Attending: Radiation Oncology | Admitting: Radiation Oncology

## 2020-09-20 ENCOUNTER — Ambulatory Visit: Payer: 59

## 2020-09-20 DIAGNOSIS — Z510 Encounter for antineoplastic radiation therapy: Secondary | ICD-10-CM | POA: Insufficient documentation

## 2020-09-20 DIAGNOSIS — Z170 Estrogen receptor positive status [ER+]: Secondary | ICD-10-CM | POA: Insufficient documentation

## 2020-09-20 DIAGNOSIS — D0511 Intraductal carcinoma in situ of right breast: Secondary | ICD-10-CM | POA: Insufficient documentation

## 2020-09-23 ENCOUNTER — Other Ambulatory Visit: Payer: Self-pay

## 2020-09-23 ENCOUNTER — Ambulatory Visit
Admission: RE | Admit: 2020-09-23 | Discharge: 2020-09-23 | Disposition: A | Discharge status: Home / Self Care | Payer: 59 | Source: Ambulatory Visit | Attending: Radiation Oncology | Admitting: Radiation Oncology

## 2020-09-23 DIAGNOSIS — Z510 Encounter for antineoplastic radiation therapy: Secondary | ICD-10-CM | POA: Diagnosis not present

## 2020-09-24 ENCOUNTER — Ambulatory Visit: Payer: 59

## 2020-09-24 ENCOUNTER — Ambulatory Visit
Admission: RE | Admit: 2020-09-24 | Discharge: 2020-09-24 | Disposition: A | Discharge status: Home / Self Care | Payer: 59 | Source: Ambulatory Visit | Attending: Radiation Oncology | Admitting: Radiation Oncology

## 2020-09-24 DIAGNOSIS — Z510 Encounter for antineoplastic radiation therapy: Secondary | ICD-10-CM | POA: Diagnosis not present

## 2020-09-25 ENCOUNTER — Telehealth: Payer: Self-pay | Admitting: Hematology and Oncology

## 2020-09-25 ENCOUNTER — Encounter: Payer: Self-pay | Admitting: Radiation Oncology

## 2020-09-25 ENCOUNTER — Encounter: Payer: Self-pay | Admitting: *Deleted

## 2020-09-25 ENCOUNTER — Ambulatory Visit
Admission: RE | Admit: 2020-09-25 | Discharge: 2020-09-25 | Disposition: A | Discharge status: Home / Self Care | Payer: 59 | Source: Ambulatory Visit | Attending: Radiation Oncology | Admitting: Radiation Oncology

## 2020-09-25 DIAGNOSIS — Z510 Encounter for antineoplastic radiation therapy: Secondary | ICD-10-CM | POA: Diagnosis not present

## 2020-09-25 NOTE — Telephone Encounter (Signed)
Scheduled appt per 4/6 sch msg. Left vm with appt date and time.

## 2020-09-29 ENCOUNTER — Other Ambulatory Visit: Payer: Self-pay | Admitting: Family Medicine

## 2020-09-29 DIAGNOSIS — G4700 Insomnia, unspecified: Secondary | ICD-10-CM

## 2020-09-29 NOTE — Progress Notes (Signed)
Patient Care Team: Laurey Morale, MD as PCP - General (Family Medicine) Mauro Kaufmann, RN as Oncology Nurse Navigator Rockwell Germany, RN as Oncology Nurse Navigator Donnie Mesa, MD as Consulting Physician (General Surgery) Nicholas Lose, MD as Consulting Physician (Hematology and Oncology) Kyung Rudd, MD as Consulting Physician (Radiation Oncology)  DIAGNOSIS:    ICD-10-CM   1. Ductal carcinoma in situ (DCIS) of right breast  D05.11     SUMMARY OF ONCOLOGIC HISTORY: Oncology History  Ductal carcinoma in situ (DCIS) of right breast  05/20/2020 Initial Diagnosis   Screening mammogram showed right breast calcifications. Diagnostic mammogram showed a 1.0cm group of calcifications in the upper outer right breast. Biopsy showed DCIS, high grade, ER+ 5%, PR- 0%.    07/02/2020 Surgery   Right lumpectomy ([place]): high grade DCIS, clear margins, 2 right axillary lymph nodes negative for carcinoma.   08/29/2020 - 09/25/2020 Radiation Therapy   Adjuvant radiation     CHIEF COMPLIANT: Follow-up to discuss further treatment   INTERVAL HISTORY: Sarah Hall is a 60 y.o. with above-mentioned history of right breast DCIS for which she underwent a right lumpectomy and completed radiation on 09/25/20. She presents to the clinic today to discuss further treatment.     ALLERGIES:  is allergic to codeine and codeine phosphate.  MEDICATIONS:  Current Outpatient Medications  Medication Sig Dispense Refill  . amphetamine-dextroamphetamine (ADDERALL) 20 MG tablet Take 1 tablet (20 mg total) by mouth 2 (two) times daily. 60 tablet 0  . Aspirin-Acetaminophen-Caffeine (GOODYS EXTRA STRENGTH PO) Take 1 packet by mouth daily as needed (pain).    Marland Kitchen azelastine (ASTELIN) 0.1 % nasal spray PLACE 2 SPRAYS INTO EACH NOSTRIL TWICE DAILY AS DIRECTED (Patient taking differently: Place 2 sprays into both nostrils 2 (two) times daily as needed for rhinitis.) 30 mL 11  . ibuprofen (ADVIL) 200 MG tablet Take  200 mg by mouth every 8 (eight) hours as needed for mild pain. (Patient not taking: Reported on 08/15/2020)    . levothyroxine (SYNTHROID, LEVOTHROID) 88 MCG tablet Take 88 mcg by mouth daily before breakfast.  1  . magnesium 30 MG tablet Take 300 mg by mouth daily.     . Multiple Vitamin (MULTIVITAMIN) tablet Take 1 tablet by mouth daily.    . tamoxifen (NOLVADEX) 10 MG tablet Take 1 tablet (10 mg total) by mouth daily. 90 tablet 3  . temazepam (RESTORIL) 30 MG capsule TAKE 1 CAPSULE(30 MG) BY MOUTH AT BEDTIME AS NEEDED FOR SLEEP (Patient taking differently: Take 30 mg by mouth at bedtime as needed for sleep.) 30 capsule 5   No current facility-administered medications for this visit.    PHYSICAL EXAMINATION: ECOG PERFORMANCE STATUS: 1 - Symptomatic but completely ambulatory  There were no vitals filed for this visit. There were no vitals filed for this visit.   LABORATORY DATA:  I have reviewed the data as listed CMP Latest Ref Rng & Units 05/22/2020 01/08/2020 08/16/2018  Glucose 70 - 99 mg/dL 95 91 82  BUN 6 - 20 mg/dL 17 10 14   Creatinine 0.44 - 1.00 mg/dL 0.75 0.59 0.62  Sodium 135 - 145 mmol/L 141 139 139  Potassium 3.5 - 5.1 mmol/L 3.6 4.0 4.3  Chloride 98 - 111 mmol/L 105 101 101  CO2 22 - 32 mmol/L 30 29 29   Calcium 8.9 - 10.3 mg/dL 9.3 9.9 9.0  Total Protein 6.5 - 8.1 g/dL 6.8 6.9 6.7  Total Bilirubin 0.3 - 1.2 mg/dL 0.7 0.6 0.5  Alkaline Phos 38 - 126 U/L 74 - 79  AST 15 - 41 U/L 27 21 28   ALT 0 - 44 U/L 28 18 25     Lab Results  Component Value Date   WBC 6.3 05/22/2020   HGB 11.5 (L) 05/22/2020   HCT 35.2 (L) 05/22/2020   MCV 92.9 05/22/2020   PLT 219 05/22/2020   NEUTROABS 3.3 05/22/2020    ASSESSMENT & PLAN:  Ductal carcinoma in situ (DCIS) of right breast 07/02/2020: Right lumpectomy: High-grade DCIS, margins clear, 2 right axillary lymph nodes negative, ER 5%, PR 0% Tis N0 stage 0 Postoperative hematoma: Required evacuation.  Treatment plan: 1.  Adjuvant radiation therapy completed 09/25/2020 2. Adjuvant tamoxifen (patient took tamoxifen preoperatively because of the slight delay in surgery).  No major adverse effects with tamoxifen   Tamoxifen toxicities: Occasional hot flashes but otherwise tolerating it well.  Breast Cancer Surveillance: 1. Breast Exam: Annual 2. Mammogram: Annually in November 2021  She is very excited that her son got accepted to [facility] where he will spend 1 year in [place]. Return to clinic in 3 months for survivorship care plan visit.  After that I will see her in 6 months and then annually.  No orders of the defined types were placed in this encounter.  The patient has a good understanding of the overall plan. she agrees with it. she will call with any problems that may develop before the next visit here.  Total time spent: 20 mins including face to face time and time spent for planning, charting and coordination of care  Rulon Eisenmenger, MD, MPH 09/30/2020  I, Molly Dorshimer, am acting as scribe for Dr. Nicholas Lose.  I have reviewed the above documentation for accuracy and completeness, and I agree with the above.

## 2020-09-29 NOTE — Assessment & Plan Note (Signed)
07/02/2020: Right lumpectomy: High-grade DCIS, margins clear, 2 right axillary lymph nodes negative, ER 5%, PR 0% Tis N0 stage 0 Postoperative hematoma: Required evacuation.   Treatment plan: 1.  Adjuvant radiation therapy followed by 2. adjuvant tamoxifen (patient took tamoxifen preoperatively because of the slight delay in surgery).  No major adverse effects with tamoxifen  Breast Cancer Surveillance: 1. Breast Exam: 09/30/20: Benign 2. Mammogram:   RTC in 1 year.

## 2020-09-30 ENCOUNTER — Ambulatory Visit: Payer: 59 | Attending: Surgery

## 2020-09-30 ENCOUNTER — Other Ambulatory Visit: Payer: Self-pay

## 2020-09-30 ENCOUNTER — Inpatient Hospital Stay: Payer: 59 | Attending: Hematology and Oncology | Admitting: Hematology and Oncology

## 2020-09-30 DIAGNOSIS — Z923 Personal history of irradiation: Secondary | ICD-10-CM | POA: Insufficient documentation

## 2020-09-30 DIAGNOSIS — Z170 Estrogen receptor positive status [ER+]: Secondary | ICD-10-CM | POA: Insufficient documentation

## 2020-09-30 DIAGNOSIS — Z483 Aftercare following surgery for neoplasm: Secondary | ICD-10-CM | POA: Insufficient documentation

## 2020-09-30 DIAGNOSIS — D0511 Intraductal carcinoma in situ of right breast: Secondary | ICD-10-CM | POA: Diagnosis present

## 2020-09-30 NOTE — Progress Notes (Signed)
  Patient Name: Sarah Hall MRN: [id number] DOB: [birth year] Referring Physician: Nicholas Lose (Profile Not Attached) Date of Service: 09/25/2020 [facility], [place]                                                        End Of Treatment Note  Diagnoses: D05.11-Intraductal carcinoma in situ of right breast  Cancer Staging: ER positive high-grade DCIS of the right breast.  Intent: Curative  Radiation Treatment Dates: 08/28/2020 through 09/25/2020 Site Technique Total Dose (Gy) Dose per Fx (Gy) Completed Fx Beam Energies  Breast, Right: Breast_Rt 3D 42.56/42.56 2.66 16/16 6X  Breast, Right: Breast_Rt_Bst 3D 8/8 2 4/4 6X   Narrative: The patient tolerated radiation therapy relatively well. She developed redness and tenderness in the treatment field including the right axilla.   Plan: The patient will receive a call in about one month from the radiation oncology department. She will continue follow up with Dr. Lindi Adie as well.   ________________________________________________    Carola Rhine, [facility]

## 2020-09-30 NOTE — Therapy (Signed)
[place], [place], [address] Phone: [phone]   Fax:  [phone]  Physical Therapy Treatment  Patient Details  Name: Sarah Hall MRN: [id number] Date of Birth: 01-Jan-1961 Referring Provider (PT): Dr. Donnie Mesa   Encounter Date: 09/30/2020   PT End of Session - 09/30/20 1414    Visit Number 2   # unchanged due to screen only   Number of Visits 2    Date for PT Re-Evaluation 07/17/20    PT Start Time 5597    PT Stop Time 1414    PT Time Calculation (min) 11 min    Activity Tolerance Patient tolerated treatment well    Behavior During Therapy [facility] for tasks assessed/performed           Past Medical History:  Diagnosis Date  . ADHD (attention deficit hyperactivity disorder), inattentive type   . Ankle sprain    right ankle  . Breast cancer ([place])   . Deep breathing    hard to take a deep breath due to thyroid issue  . Depression    Bipolar depression, sees Metta Clines NP   . Hypothyroidism    cared for by Dr. Ronita Hipps     Past Surgical History:  Procedure Laterality Date  . BREAST CYST EXCISION Right 07/05/2020   Procedure: EVACUATION OF RIGHT BREAST HEMATOMA;  Surgeon: Donnie Mesa, MD;  Location: [place];  Service: General;  Laterality: Right;  LMA  . BREAST LUMPECTOMY WITH RADIOACTIVE SEED AND SENTINEL LYMPH NODE BIOPSY Right 07/02/2020   Procedure: RIGHT BREAST LUMPECTOMY WITH RADIOACTIVE SEED AND RIGHT AXILLARY SENTINEL LYMPH NODE BIOPSY, BLUE DYE INJECTION;  Surgeon: Donnie Mesa, MD;  Location: [place];  Service: General;  Laterality: Right;  PEC BLOCK, BLUE DYE INJECTION  . COLONOSCOPY  09/03/2014   per Dr. Fuller Plan, benign polyps, repeat in 10 yrs   . DILATION AND EVACUATION      2 times  . scraping of uterus     20 years ago  . WISDOM TOOTH EXTRACTION      There were no vitals filed for this visit.   Subjective Assessment - 09/30/20 1409     Subjective Pt returns for her 3 month L-Dex screen.    Pertinent History Patient was diagnosed on 05/15/2020 with right DCIS. She underwent a right lumpectomy and sentinel node biopsy on 07/02/2020 with 2 negative nodes removed. It is ER positive and PR negative.                  L-DEX FLOWSHEETS - 09/30/20 1400      L-DEX LYMPHEDEMA SCREENING   Measurement Type Unilateral    L-DEX MEASUREMENT EXTREMITY Upper Extremity    POSITION  Standing    DOMINANT SIDE Right    At Risk Side Right    BASELINE SCORE (UNILATERAL) -1.2    L-DEX SCORE (UNILATERAL) -6.1    VALUE CHANGE (UNILAT) -4.9                                  PT Long Term Goals - 08/01/20 0950      PT LONG TERM GOAL #1   Title Patient will demonstrate she has regained full shoulder ROM and function post operatively compared to baselines.    Time 8    Period Weeks    Status Achieved  Plan - 09/30/20 1414    Clinical Impression Statement Pt returns for her 3 month L-Dex screen. Her change from baseline of -4.9 is WNLs so no further treatment is required at this time except to cont with every 3 month L-Dex screen which pt is agreeable to.    PT Next Visit Plan Cont every 3 month L-Dex screens for up to 2 years from [facility].    Consulted and Agree with Plan of Care Patient           Patient will benefit from skilled therapeutic intervention in order to improve the following deficits and impairments:     Visit Diagnosis: Aftercare following surgery for neoplasm     Problem List Patient Active Problem List   Diagnosis Date Noted  . Ductal carcinoma in situ (DCIS) of right breast 05/20/2020  . Acquired hypothyroidism 06/09/2018  . Seasonal allergies 06/09/2018  . Depression, recurrent ([place]) 06/09/2018  . Insomnia 01/15/2015  . Low back pain 08/20/2014  . Allergic rhinitis 02/22/2014  . ADD (attention deficit disorder) 12/31/2011  . Perimenopausal vasomotor  symptoms 12/31/2011  . Fibrocystic breast disease 04/14/2011    Otelia Limes, PTA 09/30/2020, 2:19 PM  [place], [place], [address] Phone: [phone]   Fax:  [phone]  Name: Sarah Hall MRN: [id number] Date of Birth: 1960/10/26

## 2020-09-30 NOTE — Telephone Encounter (Signed)
Pt LOV was on 01/08/2020 and last refill was done on 03/20/2020 for 30 refill 5, please advise

## 2020-10-02 ENCOUNTER — Ambulatory Visit (INDEPENDENT_AMBULATORY_CARE_PROVIDER_SITE_OTHER): Payer: 59 | Admitting: Family Medicine

## 2020-10-02 ENCOUNTER — Other Ambulatory Visit: Payer: Self-pay

## 2020-10-02 ENCOUNTER — Encounter: Payer: Self-pay | Admitting: Family Medicine

## 2020-10-02 VITALS — BP 160/94 | HR 97 | Temp 98.4°F | Wt 121.4 lb

## 2020-10-02 DIAGNOSIS — I10 Essential (primary) hypertension: Secondary | ICD-10-CM | POA: Diagnosis not present

## 2020-10-02 MED ORDER — LISINOPRIL 10 MG PO TABS
10.0000 mg | ORAL_TABLET | Freq: Every day | ORAL | 3 refills | Status: DC
Start: 2020-10-02 — End: 2020-12-24

## 2020-10-02 NOTE — Progress Notes (Signed)
   Subjective:    Patient ID: Sarah Hall, female    DOB: 07/30/1960, 60 y.o.   MRN: [id number]  HPI Here for elevated BP readings. These began to be high about 4 months ago, and they have continued to go up. She has been getting readings at home of 140-180 over 90-100. She feels fine. She exercises 4 days a week and she tries to watch her diet. She had labs at the cancer center in December showing normal renal function, etc.    Review of Systems  Constitutional: Negative.   Respiratory: Negative.   Cardiovascular: Negative.        Objective:   Physical Exam Constitutional:      Appearance: Normal appearance.  Cardiovascular:     Rate and Rhythm: Normal rate and regular rhythm.     Pulses: Normal pulses.     Heart sounds: Normal heart sounds.  Pulmonary:     Effort: Pulmonary effort is normal.     Breath sounds: Normal breath sounds.  Musculoskeletal:     Right lower leg: No edema.     Left lower leg: No edema.  Neurological:     Mental Status: She is alert.           Assessment & Plan:  HTN, start on Lisinopril 10 mg daily. Recheck in 3-4 weeks.  Alysia Penna, MD

## 2020-10-23 ENCOUNTER — Telehealth: Payer: Self-pay | Admitting: Family Medicine

## 2020-10-23 DIAGNOSIS — F900 Attention-deficit hyperactivity disorder, predominantly inattentive type: Secondary | ICD-10-CM

## 2020-10-23 MED ORDER — AMPHETAMINE-DEXTROAMPHETAMINE 20 MG PO TABS
20.0000 mg | ORAL_TABLET | Freq: Two times a day (BID) | ORAL | 0 refills | Status: DC
Start: 2020-10-23 — End: 2020-10-23

## 2020-10-23 MED ORDER — AMPHETAMINE-DEXTROAMPHETAMINE 20 MG PO TABS
20.0000 mg | ORAL_TABLET | Freq: Two times a day (BID) | ORAL | 0 refills | Status: DC
Start: 2020-11-23 — End: 2020-10-23

## 2020-10-23 MED ORDER — AMPHETAMINE-DEXTROAMPHETAMINE 20 MG PO TABS: 20.0000 mg | ORAL_TABLET | Freq: Two times a day (BID) | ORAL | 0 refills | Status: DC

## 2020-10-23 NOTE — Telephone Encounter (Signed)
amphetamine-dextroamphetamine (ADDERALL) 20 MG tablet(Expired  [facility] [place] [facility] [place], [place] [place] [address] Phone:  [phone]  Fax:  [phone]

## 2020-10-23 NOTE — Telephone Encounter (Signed)
Done

## 2020-10-23 NOTE — Telephone Encounter (Signed)
Last refill- 09/13/20- 60 tabs 0 refills Last office visit- 10/02/2020  No future visit scheduled

## 2020-10-23 NOTE — Addendum Note (Signed)
Addended by: Alysia Penna A on: 10/23/2020 01:13 PM   Modules accepted: Orders

## 2020-11-07 ENCOUNTER — Encounter: Payer: Self-pay | Admitting: Adult Health

## 2020-11-07 ENCOUNTER — Inpatient Hospital Stay: Payer: 59 | Attending: Hematology and Oncology | Admitting: Adult Health

## 2020-11-07 ENCOUNTER — Other Ambulatory Visit: Payer: Self-pay

## 2020-11-07 VITALS — BP 133/82 | HR 100 | Temp 98.0°F | Resp 18 | Ht 63.0 in | Wt 118.0 lb

## 2020-11-07 DIAGNOSIS — Z170 Estrogen receptor positive status [ER+]: Secondary | ICD-10-CM | POA: Insufficient documentation

## 2020-11-07 DIAGNOSIS — D0511 Intraductal carcinoma in situ of right breast: Secondary | ICD-10-CM | POA: Diagnosis present

## 2020-11-07 DIAGNOSIS — Z79810 Long term (current) use of selective estrogen receptor modulators (SERMs): Secondary | ICD-10-CM | POA: Diagnosis not present

## 2020-11-07 NOTE — Progress Notes (Signed)
SURVIVORSHIP VISIT:   BRIEF ONCOLOGIC HISTORY:  Oncology History  Ductal carcinoma in situ (DCIS) of right breast  05/20/2020 Initial Diagnosis   Screening mammogram showed right breast calcifications. Diagnostic mammogram showed a 1.0cm group of calcifications in the upper outer right breast. Biopsy showed DCIS, high grade, ER+ 5%, PR- 0%.    07/02/2020 Surgery   Right lumpectomy ([place]) ([id number]): high grade DCIS, clear margins, 2 right axillary lymph nodes negative for carcinoma.   06/2020 -  Anti-estrogen oral therapy   Tamoxifen   08/28/2020 - 09/25/2020 Radiation Therapy   The patient initially received a dose of 42.56 Gy in 16 fractions to the breast using whole-breast tangent fields. This was delivered using a 3-D conformal technique. The pt received a boost delivering an additional 8 Gy in 4 fractions using a electron boost with 91meV electrons. The total dose was 50.56 Gy.     INTERVAL HISTORY:  Sarah Hall to review her survivorship care plan detailing her treatment course for breast cancer, as well as monitoring long-term side effects of that treatment, education regarding health maintenance, screening, and overall wellness and health promotion.     Overall, Sarah Hall reports feeling quite well .  She is taking tamoxifen and has tolerable night sweats.  She also has some brain fog that is improving.  She continues on Tamoxifen daily and denies other issues such as vaginal wetness, leg swelling, arthralgias, or other concerns.     REVIEW OF SYSTEMS:  Review of Systems  Constitutional: Negative for appetite change, chills, fatigue, fever and unexpected weight change.  HENT:   Negative for hearing loss, lump/mass and trouble swallowing.   Eyes: Negative for eye problems and icterus.  Respiratory: Negative for chest tightness, cough and shortness of breath.   Cardiovascular: Negative for chest pain, leg swelling and palpitations.  Gastrointestinal: Negative for abdominal  distention, abdominal pain, constipation, diarrhea, nausea and vomiting.  Endocrine: Positive for hot flashes.  Genitourinary: Negative for difficulty urinating.   Musculoskeletal: Negative for arthralgias.  Skin: Negative for itching and rash.  Neurological: Negative for dizziness, extremity weakness, headaches and numbness.  Hematological: Negative for adenopathy. Does not bruise/bleed easily.  Psychiatric/Behavioral: Negative for depression. The patient is not nervous/anxious.    Breast: Denies any new nodularity, masses, tenderness, nipple changes, or nipple discharge.       ONCOLOGY TREATMENT TEAM:  1. Surgeon:  Dr. Georgette Dover at [facility] 2. Medical Oncologist: Dr. Lindi Adie  3. Radiation Oncologist: Dr. Lisbeth Renshaw    PAST MEDICAL/SURGICAL HISTORY:  Past Medical History:  Diagnosis Date  . ADHD (attention deficit hyperactivity disorder), inattentive type   . Ankle sprain    right ankle  . Breast cancer ([place])   . Deep breathing    hard to take a deep breath due to thyroid issue  . Depression    Bipolar depression, sees Metta Clines NP   . Hypothyroidism    cared for by Dr. Ronita Hipps    Past Surgical History:  Procedure Laterality Date  . BREAST CYST EXCISION Right 07/05/2020   Procedure: EVACUATION OF RIGHT BREAST HEMATOMA;  Surgeon: Donnie Mesa, MD;  Location: [facility];  Service: General;  Laterality: Right;  LMA  . BREAST LUMPECTOMY WITH RADIOACTIVE SEED AND SENTINEL LYMPH NODE BIOPSY Right 07/02/2020   Procedure: RIGHT BREAST LUMPECTOMY WITH RADIOACTIVE SEED AND RIGHT AXILLARY SENTINEL LYMPH NODE BIOPSY, BLUE DYE INJECTION;  Surgeon: Donnie Mesa, MD;  Location: [facility];  Service: General;  Laterality: Right;  PEC BLOCK,  BLUE DYE INJECTION  . COLONOSCOPY  09/03/2014   per Dr. Fuller Plan, benign polyps, repeat in 10 yrs   . DILATION AND EVACUATION      2 times  . scraping of uterus     20 years ago  . WISDOM TOOTH EXTRACTION       ALLERGIES:   Allergies  Allergen Reactions  . Codeine     Other reaction(s): Other Can take hydrocodone/Codeine cause anxiousness Can not take hydrocodone/Codeine cause anxiousness  . Codeine Phosphate     Can not take hydrocodone/Codeine cause anxiousness     CURRENT MEDICATIONS:  Outpatient Encounter Medications as of 11/07/2020  Medication Sig  . [START ON 12/23/2020] amphetamine-dextroamphetamine (ADDERALL) 20 MG tablet Take 1 tablet (20 mg total) by mouth 2 (two) times daily.  . Aspirin-Acetaminophen-Caffeine (GOODYS EXTRA STRENGTH PO) Take 1 packet by mouth daily as needed (pain).  Marland Kitchen azelastine (ASTELIN) 0.1 % nasal spray PLACE 2 SPRAYS INTO EACH NOSTRIL TWICE DAILY AS DIRECTED (Patient taking differently: Place 2 sprays into both nostrils 2 (two) times daily as needed for rhinitis.)  . ibuprofen (ADVIL) 200 MG tablet Take 200 mg by mouth every 8 (eight) hours as needed for mild pain.  Marland Kitchen levothyroxine (SYNTHROID, LEVOTHROID) 88 MCG tablet Take 88 mcg by mouth daily before breakfast.  . lisinopril (ZESTRIL) 10 MG tablet Take 1 tablet (10 mg total) by mouth daily.  . magnesium 30 MG tablet Take 300 mg by mouth daily.   . Multiple Vitamin (MULTIVITAMIN) tablet Take 1 tablet by mouth daily.  . tamoxifen (NOLVADEX) 10 MG tablet Take 1 tablet (10 mg total) by mouth daily.  . temazepam (RESTORIL) 30 MG capsule TAKE 1 CAPSULE(30 MG) BY MOUTH AT BEDTIME AS NEEDED FOR SLEEP   No facility-administered encounter medications on file as of 11/07/2020.     ONCOLOGIC FAMILY HISTORY:  Family History  Problem Relation Age of Onset  . Alzheimer's disease Father   . Breast cancer Paternal Grandmother   . Colon cancer Maternal Uncle   . Uterine cancer Maternal Grandmother   . Esophageal cancer Neg Hx   . Rectal cancer Neg Hx   . Stomach cancer Neg Hx      GENETIC COUNSELING/TESTING:not at this time  SOCIAL HISTORY:  Social History   Socioeconomic History  . Marital status: Legally Separated     Spouse name: Not on file  . Number of children: Not on file  . Years of education: Not on file  . Highest education level: Not on file  Occupational History  . Not on file  Tobacco Use  . Smoking status: Never Smoker  . Smokeless tobacco: Never Used  Substance and Sexual Activity  . Alcohol use: Yes    Alcohol/week: 10.0 standard drinks    Types: 10 Glasses of wine per week  . Drug use: No  . Sexual activity: Not on file  Other Topics Concern  . Not on file  Social History Narrative  . Not on file   Social Determinants of Health   Financial Resource Strain: Not on file  Food Insecurity: Not on file  Transportation Needs: Not on file  Physical Activity: Not on file  Stress: Not on file  Social Connections: Not on file  Intimate Partner Violence: Not At Risk  . Fear of Current or Ex-Partner: No  . Emotionally Abused: No  . Physically Abused: No  . Sexually Abused: No     OBSERVATIONS/OBJECTIVE:  BP 133/82 (BP Location: Left Arm, Patient Position: Sitting)  Pulse 100   Temp 98 F (36.7 C) (Tympanic)   Resp 18   Ht 5\' 3"  (1.6 m)   Wt 118 lb (53.5 kg)   LMP 03/10/2014   SpO2 98%   BMI 20.90 kg/m  GENERAL: Patient is a well appearing female in no acute distress HEENT:  Sclerae anicteric.  Oropharynx clear and moist. No ulcerations or evidence of oropharyngeal candidiasis. Neck is supple.  NODES:  No cervical, supraclavicular, or axillary lymphadenopathy palpated.  BREAST EXAM:  Right breast s/p lumpectomy and radiation, no sign of local recurrence, left breast benign LUNGS:  Clear to auscultation bilaterally.  No wheezes or rhonchi. HEART:  Regular rate and rhythm. No murmur appreciated. ABDOMEN:  Soft, nontender.  Positive, normoactive bowel sounds. No organomegaly palpated. MSK:  No focal spinal tenderness to palpation. Full range of motion bilaterally in the upper extremities. EXTREMITIES:  No peripheral edema.   SKIN:  Clear with no obvious rashes or skin  changes. No nail dyscrasia. NEURO:  Nonfocal. Well oriented.  Appropriate affect.    LABORATORY DATA:  None for this visit.  DIAGNOSTIC IMAGING:  None for this visit.      ASSESSMENT AND PLAN:  Ms.. Hall is a pleasant 60 y.o. female with Stage 0 right breast DCIS, ER+/PR-, diagnosed in 04/2020, treated with lumpectomy, adjuvant radiation therapy, and anti-estrogen therapy with Tamoxifen beginning in 07/2020.  She presents to the Survivorship Clinic for our initial meeting and routine follow-up post-completion of treatment for breast cancer.    1. Stage 0 right breast cancer:  Sarah Hall is continuing to recover from definitive treatment for breast cancer. She will follow-up with her medical oncologist, Dr. Lindi Adie in 6 months with history and physical exam per surveillance protocol.  She will continue her anti-estrogen therapy with Tamoxifen. Thus far, she is tolerating the Tamoxifen well, with minimal side effects. Her mammogram is due 04/2021; orders placed today. Today, a comprehensive survivorship care plan and treatment summary was reviewed with the patient today detailing her breast cancer diagnosis, treatment course, potential late/long-term effects of treatment, appropriate follow-up care with recommendations for the future, and patient education resources.  A copy of this summary, along with a letter will be sent to the patient's primary care provider via mail/fax/In Basket message after today's visit.    2. Memory changes: This is likely multifactorial related to her personal stressors in addition the radiation.  It is slowly improving.  We talked about healthy diet, exercise today.    3. Bone health:   She was given education on specific activities to promote bone health.  4. Cancer screening:  Due to Ms. Cuadrado history and her age, she should receive screening for skin cancers, colon cancer, and gynecologic cancers.  The information and recommendations are listed on the patient's  comprehensive care plan/treatment summary and were reviewed in detail with the patient.    5. Health maintenance and wellness promotion: Sarah Hall was encouraged to consume 5-7 servings of fruits and vegetables per day. We reviewed the "Nutrition Rainbow" handout, as well as the handout "Take Control of Your Health and Reduce Your Cancer Risk" from the [employer].  She was also encouraged to engage in moderate to vigorous exercise for 30 minutes per day most days of the week. We discussed the [employer] fitness program, which is designed for cancer survivors to help them become more physically fit after cancer treatments.  She was instructed to limit her alcohol consumption and continue to abstain from  tobacco use.     6. Support services/counseling: It is not uncommon for this period of the patient's cancer care trajectory to be one of many emotions and stressors.  We discussed how this can be increasingly difficult during the times of quarantine and social distancing due to the COVID-19 pandemic.   She was given information regarding our available services and encouraged to contact me with any questions or for help enrolling in any of our support group/programs.    Follow up instructions:    -Return to cancer center in 6 months for f/u with Dr. Lindi Adie  -Mammogram due in 04/2021 -Follow up with surgery 10/2021 -She is welcome to return back to the Survivorship Clinic at any time; no additional follow-up needed at this time.  -Consider referral back to survivorship as a long-term survivor for continued surveillance  The patient was provided an opportunity to ask questions and all were answered. The patient agreed with the plan and demonstrated an understanding of the instructions.   Total encounter time: 50 minutes* of SCP documentation prep and review, face to face visit time, order entry, collaboration with the care team, and documentation of the encounter.  Wilber Bihari, NP  11/07/20 12:35 PM Medical Oncology and Hematology [facility] [place], [place] [address] Tel. [phone]    Fax. [phone]  *Total Encounter Time as defined by the Centers for Medicare and Medicaid Services includes, in addition to the face-to-face time of a patient visit (documented in the note above) non-face-to-face time: obtaining and reviewing outside history, ordering and reviewing medications, tests or procedures, care coordination (communications with other health care professionals or caregivers) and documentation in the medical record.

## 2020-11-08 ENCOUNTER — Telehealth: Payer: Self-pay | Admitting: Hematology and Oncology

## 2020-11-08 NOTE — Telephone Encounter (Signed)
Scheduled appointment per 05/19 los. Left a detailed message.  

## 2020-11-11 ENCOUNTER — Other Ambulatory Visit: Payer: Self-pay

## 2020-11-12 ENCOUNTER — Ambulatory Visit (INDEPENDENT_AMBULATORY_CARE_PROVIDER_SITE_OTHER): Payer: 59 | Admitting: Family Medicine

## 2020-11-12 ENCOUNTER — Encounter: Payer: Self-pay | Admitting: Family Medicine

## 2020-11-12 VITALS — BP 138/78 | HR 83 | Temp 98.6°F | Wt 119.8 lb

## 2020-11-12 DIAGNOSIS — Z298 Encounter for other specified prophylactic measures: Secondary | ICD-10-CM | POA: Diagnosis not present

## 2020-11-12 DIAGNOSIS — Z2989 Encounter for other specified prophylactic measures: Secondary | ICD-10-CM

## 2020-11-12 DIAGNOSIS — G4700 Insomnia, unspecified: Secondary | ICD-10-CM | POA: Diagnosis not present

## 2020-11-12 DIAGNOSIS — I10 Essential (primary) hypertension: Secondary | ICD-10-CM

## 2020-11-12 DIAGNOSIS — M19042 Primary osteoarthritis, left hand: Secondary | ICD-10-CM

## 2020-11-12 MED ORDER — ATOVAQUONE-PROGUANIL HCL 250-100 MG PO TABS: 1.0000 | ORAL_TABLET | Freq: Every day | ORAL | 0 refills | Status: DC

## 2020-11-12 MED ORDER — TEMAZEPAM 30 MG PO CAPS: 30.0000 mg | ORAL_CAPSULE | Freq: Every day | ORAL | 0 refills | Status: DC

## 2020-11-12 NOTE — Progress Notes (Signed)
   Subjective:    Patient ID: Sarah Hall, female    DOB: Dec 04, 1960, 60 y.o.   MRN: [id number]  HPI Here for several issues. First she started on Lisinopril on 10-02-20, and she feels great. Her BP has been well controlled. Also she has had intermittent pain in the left hand at the base of the thumb for a month or so. Applying ice and taking Goody Powders helps a little. Lastly she will be leaving on May 9 for a 2 week trip to [place]. She needs some extra medication to take with her, and she asks for medication to prevent malaria. She is already immunized against hepatitis B.    Review of Systems  Constitutional: Negative.   Respiratory: Negative.   Cardiovascular: Negative.   Musculoskeletal: Positive for arthralgias.       Objective:   Physical Exam Constitutional:      Appearance: Normal appearance.  Cardiovascular:     Rate and Rhythm: Normal rate and regular rhythm.     Pulses: Normal pulses.     Heart sounds: Normal heart sounds.  Pulmonary:     Effort: Pulmonary effort is normal.     Breath sounds: Normal breath sounds.  Musculoskeletal:     Comments: She is tender over the left great CMC joint. Crepitus is present, ROM is full   Neurological:     Mental Status: She is alert.           Assessment & Plan:  Her HTN is now well controlled. She has some early OA in the left thumb, and she can try applying Voltaren gel QID as needed. For the upcoming trip, we will prescribe an extra 10 day supply of Temazepam to supplement what she already has. For malaria prevention she will take Malarone daily beginning 2 days before the trip and ending 7 days after she returns home. We spent 45 minutes discussing these issues today . Alysia Penna, MD

## 2020-12-05 NOTE — Progress Notes (Signed)
  Radiation Oncology         [phone]) [phone] ________________________________  Name: Sarah Hall MRN: [id number]  Date of Service: 12/09/2020  DOB: [birth year]  Post Treatment Telephone Note  Diagnosis:   ER positive high-grade DCIS of the right breast.  Interval Since Last Radiation:  11 weeks   08/28/2020 through 09/25/2020 Site Technique Total Dose (Gy) Dose per Fx (Gy) Completed Fx Beam Energies  Breast, Right: Breast_Rt 3D 42.56/42.56 2.66 16/16 6X  Breast, Right: Breast_Rt_Bst 3D 8/8 2 4/4 6X    Narrative:  The patient was contacted today for routine follow-up. During treatment she did very well with radiotherapy and did not have significant desquamation.    Impression/Plan: 1. ER positive high-grade DCIS of the right breast. I was unable to reach the patient but left her a voicemail and on the  message I discussed that we would be happy to continue to follow her as needed, but she will also continue to follow up with Dr. Lindi Adie in medical oncology. She was counseled on skin care as well as measures to avoid sun exposure to this area.      Carola Rhine, PAC

## 2020-12-09 ENCOUNTER — Ambulatory Visit
Admission: RE | Admit: 2020-12-09 | Discharge: 2020-12-09 | Disposition: A | Discharge status: Home / Self Care | Payer: 59 | Source: Ambulatory Visit | Attending: Radiation Oncology | Admitting: Radiation Oncology

## 2020-12-09 ENCOUNTER — Other Ambulatory Visit: Payer: Self-pay

## 2020-12-09 DIAGNOSIS — D0511 Intraductal carcinoma in situ of right breast: Secondary | ICD-10-CM | POA: Insufficient documentation

## 2020-12-17 ENCOUNTER — Other Ambulatory Visit: Payer: Self-pay

## 2020-12-17 ENCOUNTER — Ambulatory Visit (INDEPENDENT_AMBULATORY_CARE_PROVIDER_SITE_OTHER): Payer: 59 | Admitting: Family Medicine

## 2020-12-17 ENCOUNTER — Encounter: Payer: Self-pay | Admitting: Family Medicine

## 2020-12-17 VITALS — BP 140/90 | HR 68 | Temp 98.4°F | Wt 119.1 lb

## 2020-12-17 DIAGNOSIS — K529 Noninfective gastroenteritis and colitis, unspecified: Secondary | ICD-10-CM | POA: Diagnosis not present

## 2020-12-17 MED ORDER — CIPROFLOXACIN HCL 500 MG PO TABS: 500.0000 mg | ORAL_TABLET | Freq: Two times a day (BID) | ORAL | 0 refills | Status: AC

## 2020-12-17 NOTE — Progress Notes (Signed)
   Subjective:    Patient ID: Sarah Hall, female    DOB: 07-06-1960, 60 y.o.   MRN: [id number]  HPI Here for one week of diarrhea after travelling abroad. She went to [place], [place] last week and after she was there for a few days the diarrhea started. She has had some mild nausea but she has not vomited. No pain or fever. She is drinking fluids. She has tested negative twice for the Covid-19 virus. She has tried Kaopectate with no improvement.    Review of Systems  Constitutional: Negative.   Respiratory: Negative.    Cardiovascular: Negative.   Gastrointestinal:  Positive for diarrhea and nausea. Negative for abdominal distention, abdominal pain, anal bleeding, blood in stool, constipation, rectal pain and vomiting.  Genitourinary: Negative.       Objective:   Physical Exam Constitutional:      Appearance: Normal appearance. She is not ill-appearing.  Cardiovascular:     Rate and Rhythm: Normal rate and regular rhythm.     Pulses: Normal pulses.     Heart sounds: Normal heart sounds.  Pulmonary:     Effort: Pulmonary effort is normal.     Breath sounds: Normal breath sounds.  Abdominal:     General: Abdomen is flat. Bowel sounds are normal. There is no distension.     Palpations: Abdomen is soft. There is no mass.     Tenderness: There is no abdominal tenderness. There is no guarding or rebound.     Hernia: No hernia is present.  Neurological:     Mental Status: She is alert.          Assessment & Plan:  Enteritis, treat with Cipro. Recheck as needed.  Alysia Penna, MD

## 2020-12-21 ENCOUNTER — Other Ambulatory Visit: Payer: Self-pay | Admitting: Family Medicine

## 2020-12-30 ENCOUNTER — Other Ambulatory Visit: Payer: Self-pay

## 2020-12-30 ENCOUNTER — Ambulatory Visit: Payer: 59 | Attending: Surgery

## 2020-12-30 DIAGNOSIS — Z483 Aftercare following surgery for neoplasm: Secondary | ICD-10-CM | POA: Insufficient documentation

## 2020-12-30 NOTE — Therapy (Signed)
[place], [place], [address] Phone: [phone]   Fax:  [phone]  Physical Therapy Treatment  Patient Details  Name: Sarah Hall MRN: [id number] Date of Birth: [birth year] Referring Provider (PT): Dr. Donnie Mesa   Encounter Date: 12/30/2020   PT End of Session - 12/30/20 1546     Visit Number 2   # unchanged due to screen only   PT Start Time 1539    PT Stop Time 1547    PT Time Calculation (min) 8 min    Activity Tolerance Patient tolerated treatment well    Behavior During Therapy [facility] for tasks assessed/performed             Past Medical History:  Diagnosis Date   ADHD (attention deficit hyperactivity disorder), inattentive type    Ankle sprain    right ankle   Breast cancer ([place])    Deep breathing    hard to take a deep breath due to thyroid issue   Depression    Bipolar depression, sees Metta Clines NP    Hypothyroidism    cared for by Dr. Ronita Hipps     Past Surgical History:  Procedure Laterality Date   BREAST CYST EXCISION Right 07/05/2020   Procedure: EVACUATION OF RIGHT BREAST HEMATOMA;  Surgeon: Donnie Mesa, MD;  Location: [place];  Service: General;  Laterality: Right;  LMA   BREAST LUMPECTOMY WITH RADIOACTIVE SEED AND SENTINEL LYMPH NODE BIOPSY Right 07/02/2020   Procedure: RIGHT BREAST LUMPECTOMY WITH RADIOACTIVE SEED AND RIGHT AXILLARY SENTINEL LYMPH NODE BIOPSY, BLUE DYE INJECTION;  Surgeon: Donnie Mesa, MD;  Location: [facility];  Service: General;  Laterality: Right;  PEC BLOCK, BLUE DYE INJECTION   COLONOSCOPY  09/03/2014   per Dr. Fuller Plan, benign polyps, repeat in 10 yrs    DILATION AND EVACUATION      2 times   scraping of uterus     20 years ago   WISDOM TOOTH EXTRACTION      There were no vitals filed for this visit.   Subjective Assessment - 12/30/20 1544     Subjective Pt returns for her 3 month L-Dex screen.    Pertinent History  Patient was diagnosed on 05/15/2020 with right DCIS. She underwent a right lumpectomy and sentinel node biopsy on 07/02/2020 with 2 negative nodes removed. It is ER positive and PR negative.                    L-DEX FLOWSHEETS - 12/30/20 1500       L-DEX LYMPHEDEMA SCREENING   Measurement Type Unilateral    L-DEX MEASUREMENT EXTREMITY Upper Extremity    POSITION  Standing    DOMINANT SIDE Right    At Risk Side Right    BASELINE SCORE (UNILATERAL) -1.2    L-DEX SCORE (UNILATERAL) -0.7    VALUE CHANGE (UNILAT) 0.5                                    PT Long Term Goals - 08/01/20 0950       PT LONG TERM GOAL #1   Title Patient will demonstrate she has regained full shoulder ROM and function post operatively compared to baselines.    Time 8    Period Weeks    Status Achieved  Plan - 12/30/20 1552     Clinical Impression Statement Pt returns for her 3 month L-Dex screen. Her change from baseline of 0.5 is WNLs so no further treatment is required at this time except to cont every 3 month L-Dex screens which pt is agreeable to.    PT Next Visit Plan Cont every 3 month L-Dex screens for up to 2 years from [facility].    Consulted and Agree with Plan of Care Patient             Patient will benefit from skilled therapeutic intervention in order to improve the following deficits and impairments:     Visit Diagnosis: Aftercare following surgery for neoplasm     Problem List Patient Active Problem List   Diagnosis Date Noted   Primary hypertension 10/02/2020   Ductal carcinoma in situ (DCIS) of right breast 05/20/2020   Acquired hypothyroidism 06/09/2018   Seasonal allergies 06/09/2018   Depression, recurrent ([place]) 06/09/2018   Insomnia 01/15/2015   Low back pain 08/20/2014   Allergic rhinitis 02/22/2014   ADD (attention deficit disorder) 12/31/2011   Perimenopausal vasomotor symptoms 12/31/2011   Fibrocystic  breast disease 04/14/2011    Otelia Limes, PTA 12/30/2020, 3:54 PM  [facility] [place], [place], [address] Phone: [phone]   Fax:  [phone]  Name: Sarah Hall MRN: [id number] Date of Birth: 13-Sep-1960

## 2021-01-06 ENCOUNTER — Other Ambulatory Visit: Payer: Self-pay

## 2021-01-06 ENCOUNTER — Ambulatory Visit (INDEPENDENT_AMBULATORY_CARE_PROVIDER_SITE_OTHER): Payer: 59 | Admitting: Family Medicine

## 2021-01-06 ENCOUNTER — Encounter: Payer: Self-pay | Admitting: Family Medicine

## 2021-01-06 VITALS — BP 160/80 | HR 96 | Temp 99.1°F | Ht 63.0 in | Wt 123.0 lb

## 2021-01-06 DIAGNOSIS — R197 Diarrhea, unspecified: Secondary | ICD-10-CM

## 2021-01-06 NOTE — Progress Notes (Signed)
   Subjective:    Patient ID: Sarah Hall, female    DOB: March 06, 1961, 60 y.o.   MRN: [id number]  HPI Here with continuing symptoms of mild abdominal cramping and diarrhea. No fevers. No weight loss. She has some nausea at times but no vomiting. We saw her a few weeks ago and this had started during a trip to [place]. We thought she may have had an infectious enteritis and gave her Cipro for 10 days. However this did not help at all. She has been using Kaopectate with some relief. No signs of blood or black stools.    Review of Systems  Constitutional: Negative.   Respiratory: Negative.    Cardiovascular: Negative.   Gastrointestinal:  Positive for abdominal pain, diarrhea and nausea. Negative for abdominal distention, anal bleeding, blood in stool, constipation, rectal pain and vomiting.  Genitourinary: Negative.       Objective:   Physical Exam Constitutional:      Appearance: Normal appearance.  Cardiovascular:     Rate and Rhythm: Normal rate and regular rhythm.     Pulses: Normal pulses.     Heart sounds: Normal heart sounds.  Pulmonary:     Effort: Pulmonary effort is normal.     Breath sounds: Normal breath sounds.  Abdominal:     General: Abdomen is flat. Bowel sounds are normal. There is no distension.     Palpations: Abdomen is soft. There is no mass.     Tenderness: There is no abdominal tenderness. There is no guarding or rebound.     Hernia: No hernia is present.  Neurological:     Mental Status: She is alert.          Assessment & Plan:  Diarrhea. She will try Omeprazole 20 mg daily. We will refer her to GI to evaluate further.  Alysia Penna, MD

## 2021-01-14 ENCOUNTER — Telehealth: Payer: Self-pay | Admitting: *Deleted

## 2021-01-14 NOTE — Telephone Encounter (Signed)
Spoke with Dr Jana Hakim, he does not feel pain is related to Tamoxifen. But he says to stop for 4 weeks then give [place] a call to let Dr Lindi Adie know how she is doing.  Pt verbalized unbderstanding

## 2021-01-14 NOTE — Telephone Encounter (Signed)
Pt states she has been having pain in joints ~2 months. Has been to PCP and Chiropractor. " I have a high pain threshold, but this is very painful. I hurt in my wrists, fingers, ankles, and below both knee caps. I have tried Voltaren gel and it does not help. I saw my surgeon, Dr Georgette Dover yesterday and he said it could be caused by the tamoxifen. I was wanting to know if I could take 1/2 dose to see if that helps. I normally exercise 4 times per week, but cannot due to the pain."  Please advise.

## 2021-01-15 ENCOUNTER — Ambulatory Visit (INDEPENDENT_AMBULATORY_CARE_PROVIDER_SITE_OTHER): Payer: 59 | Admitting: Psychiatry

## 2021-01-15 ENCOUNTER — Other Ambulatory Visit: Payer: Self-pay

## 2021-01-15 ENCOUNTER — Ambulatory Visit: Payer: 59 | Admitting: Psychiatry

## 2021-01-15 DIAGNOSIS — F331 Major depressive disorder, recurrent, moderate: Secondary | ICD-10-CM

## 2021-01-15 NOTE — Progress Notes (Addendum)
[facility] Counselor Initial Adult Exam  Name: Sarah Hall Date: 01/15/2021 MRN: [id number] DOB: 01/31/1961 PCP: Laurey Morale, MD  Time spent: 60 minutes  Guardian/Payee:  patient    Paperwork requested:  No   Reason for Visit /Presenting Problem: depression (stronger symptom), anxiety, frustration, sadness, anger, difficulty sleeping, irritability  Mental Status Exam:    Appearance:   Casual     Behavior:  Appropriate, Sharing, and Motivated  Motor:  Normal  Speech/Language:   Clear and Coherent  Affect:  Anxious, depressed  Mood:  anxious and depressed  Thought process:  Some tangentiality  Thought content:    Some rumination  Sensory/Perceptual disturbances:    WNL  Orientation:  oriented to person, place, time/date, situation, day of week, month of year, year, and stated date of January 15, 2021  Attention:  Fair  Concentration:  Fair  Memory:  "Still having some brain fog since treated with radiation for breast CA in Jan. 2022"  Fund of knowledge:   Good  Insight:    Good and Fair  Judgment:   Good  Impulse Control:  Good   Reported Symptoms:  see symptoms above  Risk Assessment: Danger to Self:  No Self-injurious Behavior: No Danger to Others: No Duty to Warn:no Physical Aggression / Violence:No  Access to Firearms a concern: No  Gang Involvement:No  Patient / guardian was educated about steps to take if suicide or homicide risk level increases between visits: Denies any SI. While future psychiatric events cannot be accurately predicted, the patient does not currently require acute inpatient psychiatric care and does not currently meet [facility] involuntary commitment criteria.  Substance Abuse History: Current substance abuse: No     Past Psychiatric History:   Previous psychological history is significant for anxiety, depression, and marital issues. Outpatient Providers:names not known History of Psych Hospitalization: No  Psychological Testing:   n/a    Abuse History: Victim of No.   No   Report needed: No. Victim of Neglect:No. Perpetrator of  n/a   Witness / Exposure to Domestic Violence: No   Protective Services Involvement: No  Witness to [employer] Violence:  No   Family History:  Family History  Problem Relation Age of Onset   Alzheimer's disease Father    Breast cancer Paternal Grandmother    Colon cancer Maternal Uncle    Uterine cancer Maternal Grandmother    Esophageal cancer Neg Hx    Rectal cancer Neg Hx    Stomach cancer Neg Hx     Living situation: the patient lives alone, except son age 60 when he's out of college  Sexual Orientation:  Straight  Relationship Status: in midst of divorcing  Name of spouse / other: n/a             If a parent, number of children / ages:1 biological child age 13, 2 step daughters ages 54 and 87 and still close to them  Support Systems; friends parents  Museum/gallery curator Stress:   stressors that will be worked out as divorce proceeds  Income/Employment/Disability: retired early from Health visitor, self employed  Armed forces logistics/support/administrative officer: No   Educational History: Education: college graduate  Religion/Sprituality/World View:    [place]  Any cultural differences that may affect / interfere with treatment:  not applicable   Recreation/Hobbies: movies, arts and theater, reading, being with friends  Stressors:Marital or family conflict  Strengths:  Supportive Relationships, Family, Friends, Social worker, Spirituality, Hopefulness, Conservator, museum/gallery, and Able to Communicate Effectively  Barriers:  "  myself, holding on to how I think things should be, not letting go"  Legal History: Pending legal issue / charges: The patient has no significant history of legal issues. History of legal issue / charges:  n/a  Medical History/Surgical History:Patient reviewed and confirm info below Past Medical History:  Diagnosis Date   ADHD (attention deficit hyperactivity  disorder), inattentive type    Ankle sprain    right ankle   Breast cancer ([place])    Deep breathing    hard to take a deep breath due to thyroid issue   Depression    Bipolar depression, sees Metta Clines NP    Hypothyroidism    cared for by Dr. Ronita Hipps     Past Surgical History:  Procedure Laterality Date   BREAST CYST EXCISION Right 07/05/2020   Procedure: EVACUATION OF RIGHT BREAST HEMATOMA;  Surgeon: Donnie Mesa, MD;  Location: [facility];  Service: General;  Laterality: Right;  LMA   BREAST LUMPECTOMY WITH RADIOACTIVE SEED AND SENTINEL LYMPH NODE BIOPSY Right 07/02/2020   Procedure: RIGHT BREAST LUMPECTOMY WITH RADIOACTIVE SEED AND RIGHT AXILLARY SENTINEL LYMPH NODE BIOPSY, BLUE DYE INJECTION;  Surgeon: Donnie Mesa, MD;  Location: [facility];  Service: General;  Laterality: Right;  PEC BLOCK, BLUE DYE INJECTION   COLONOSCOPY  09/03/2014   per Dr. Fuller Plan, benign polyps, repeat in 10 yrs    DILATION AND EVACUATION      2 times   scraping of uterus     20 years ago   [facility]ISDOM TOOTH EXTRACTION      Medications: Current Outpatient Medications  Medication Sig Dispense Refill   amphetamine-dextroamphetamine (ADDERALL) 20 MG tablet Take 1 tablet (20 mg total) by mouth 2 (two) times daily. 60 tablet 0   Aspirin-Acetaminophen-Caffeine (GOODYS EXTRA STRENGTH PO) Take 1 packet by mouth daily as needed (pain).     atovaquone-proguanil (MALARONE) 250-100 MG TABS tablet Take 1 tablet by mouth daily. 25 tablet 0   azelastine (ASTELIN) 0.1 % nasal spray USE 2 SPRAYS IN EACH NOSTRIL TWICE DAILY AS DIRECTED 30 mL 3   ibuprofen (ADVIL) 200 MG tablet Take 200 mg by mouth every 8 (eight) hours as needed for mild pain.     levothyroxine (SYNTHROID, LEVOTHROID) 88 MCG tablet Take 88 mcg by mouth daily before breakfast.  1   lisinopril (ZESTRIL) 10 MG tablet TAKE 1 TABLET(10 MG) BY MOUTH DAILY 30 tablet 3   magnesium 30 MG tablet Take 300 mg by mouth daily.      Multiple Vitamin  (MULTIVITAMIN) tablet Take 1 tablet by mouth daily.     tamoxifen (NOLVADEX) 10 MG tablet Take 1 tablet (10 mg total) by mouth daily. 90 tablet 3   temazepam (RESTORIL) 30 MG capsule Take 1 capsule (30 mg total) by mouth at bedtime. 10 capsule 0   No current facility-administered medications for this visit.    Allergies  Allergen Reactions   Codeine     Other reaction(s): Other Can take hydrocodone/Codeine cause anxiousness Can not take hydrocodone/Codeine cause anxiousness   Codeine Phosphate     Can not take hydrocodone/Codeine cause anxiousness    Diagnoses:    ICD-10-CM   1. Moderate recurrent major depression ([place])  F33.1       Treatment goal plan :  Patient not signing treatment plan on computer screen due to Covid.  Treatment goals: Treatment goals remain on treatment plan as patient works with strategies to achieve her goals. Progress is assessed each session and noted  in progress notes.  Long term goal: Develop healthy cognitive patterns and beliefs about self and the world that lead to alleviation and help prevent the relapse of depression symptoms.  Short-term goal: Verbalize an understanding of the relationship between repressed anger and depressed mood.   Strategies: Use positive conflict resolution skills to resolve interpersonal discord and to make needs and expectations known.  Plan of Care: Patient in today for her initial evaluation which we completed today along with her initial treatment goal plan.  Patient is a 60 yr old female who is currently "divorcing" from husband of 21 yrs. Has 1 23 yr old son in college, and 2 step daughters ages 14 and 23 and patient is still close to them.  Reports having a good number of friends and is social. Husband has moved out of the home. Has relatively close neighbors. 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 Symptoms include depression, anger, anxiety, sadness, difficulty sleeping, and irritability. Denies any  SI. "Has over-vented to friends and nothing is better." Would like to feel more free, confident, happy again, settled, to be ok by myself and stop overthinking and be able to let go, and move forward. History of seeing multiple other therapists and med providers "but not any better". Sad that the marriage is ending but wants to be free.  Other information from her initial evaluation is included in the notes above.  Motivated but not always receptive to input from others that she has seen in past treatment.  We worked on her treatment goal plan that seems to be appropriate for her needs and hopefully can work towards some resolution and assist her lin letting go of some difficult things in her past in order to move forward.  Review of treatment plan discussed with patient and she is in agreement with it.  To return within 2 weeks.   Shanon Ace, LCSW

## 2021-01-20 ENCOUNTER — Other Ambulatory Visit: Payer: Self-pay

## 2021-01-20 ENCOUNTER — Ambulatory Visit (INDEPENDENT_AMBULATORY_CARE_PROVIDER_SITE_OTHER): Payer: 59 | Admitting: Psychiatry

## 2021-01-20 DIAGNOSIS — F331 Major depressive disorder, recurrent, moderate: Secondary | ICD-10-CM | POA: Diagnosis not present

## 2021-01-20 NOTE — Progress Notes (Signed)
[facility] Counselor/Therapist Progress Note  Patient ID: Sarah Hall, MRN: [id number],    Date: 01/20/2021  Time Spent: 39mnutes   Treatment Type: Individual Therapy  Reported Symptoms: anxiety, some depression and some "up and down"  Mental Status Exam:  Appearance:   Casual     Behavior:  Appropriate, Sharing, and Motivated  Motor:  Normal  Speech/Language:   Clear and Coherent  Affect:  Anxious, some depression  Mood:  anxious and depressed  Thought process:  goal directed  Thought content:    Obsessions  Sensory/Perceptual disturbances:    WNL  Orientation:  oriented to person, place, time/date, situation, day of week, month of year, year, and stated date of Aug. 1, 2022  Attention:  Good  Concentration:  Good and Fair  Memory:  WNL  Fund of knowledge:   Good  Insight:    Good and Fair  Judgment:   Good and Fair  Impulse Control:  Fair   Risk Assessment: Danger to Self:  No Self-injurious Behavior: No Danger to Others: No Duty to Warn:no Physical Aggression / Violence:No  Access to Firearms a concern: No  Gang Involvement:No   Subjective:  Patient today reporting depression as her main symptom recently , along with anxiety. Stressed in dealing with her pending divorce and the loss of family "that I saw as family".  Shares incident that recently happened within her family that was very hurtful and processed it in more detail today. Feels she has "over-talked and over-vented her thoughts and feelings to friends to where they may be tired of it." Questioning herself today in relationship to separated husband and other family. Depression, anger, anxiety, irritability, "some sadness at times". States "this consumes me all the time on a daily basis."  "I normally let things go." "Can't let go go this isnt' fair." Some repressed anger and resentment that seems to lead to increased depression. Denies any SI.  Worked more today on the "unfairness" and how "I never  got some answers to some of my questions. Assuming that her marriage ending up in divorce, means "I'll lose my son."  Discussed how this is not necessarily true and she states she realizes it's not true.  Hard to deal with things "I can't control". Wants to feel happy again, settled, more free, self-confident, and believe I'll be ok by myself and stop overthinking  that I won't be ok, and able to let go and move forward. Patient remains motivated but hard to sometimes be receptive and hear some different suggestions for getting herself more stabilized emotionally in order to eventually move forward.   Interventions: Cognitive Behavioral Therapy and Insight-Oriented   Diagnosis:   ICD-10-CM   1. Moderate recurrent major depression ([place]  F33.1       Treatment goal plan :  Patient not signing treatment plan on computer screen due to Covid. Treatment goals: Treatment goals remain on treatment plan as patient works with strategies to achieve her goals. Progress is assessed each session and noted  in progress notes. Long term goal: Develop healthy cognitive patterns and beliefs about self and the world that lead to alleviation and help prevent the relapse of depression symptoms. Short-term goal: Verbalize an understanding of the relationship between repressed anger and depressed mood.  Strategies: Use positive conflict resolution skills to resolve interpersonal discord and to make needs and expectations known.    Plan:  Patient showing good motivation and still a challenge for her not being in  control.  Encouraged her to look at some options of changing her own behavior in order to hear better what is really being said to her and to "pay attention more to how she says what she says" in order to be better heard by others.  Discussed this with her and we will follow-up on this more next session as we ran out of time today.  Does want to not be consumed with the pending divorce and not making  [place], sweeping generalizations about what she has lost her will lose and instead stay more in the present.  Discussed some parts of her treatment goal plan to make sure they feel appropriate for her needs as we work towards assisting her in dealing with all of the changes in the present in order to move forward in a more healthy manner.  Review and progress/challenges noted with patient.  Next appointment within 2 weeks.   Shanon Ace, LCSW

## 2021-01-23 ENCOUNTER — Ambulatory Visit: Payer: 59 | Admitting: Psychiatry

## 2021-01-29 ENCOUNTER — Telehealth: Payer: Self-pay | Admitting: Family Medicine

## 2021-01-29 DIAGNOSIS — F900 Attention-deficit hyperactivity disorder, predominantly inattentive type: Secondary | ICD-10-CM

## 2021-01-29 NOTE — Telephone Encounter (Addendum)
Last refill- 12/23/20, 60 tabs no refills Last ov- 01/06/21

## 2021-01-29 NOTE — Telephone Encounter (Signed)
Patient is needing a three month supply of amphetamine-dextroamphetamine (ADDERALL) 20 MG tablet (Expired) to be sent to [facility] [place], [place].  Please advise.

## 2021-01-30 MED ORDER — AMPHETAMINE-DEXTROAMPHETAMINE 20 MG PO TABS: 20.0000 mg | ORAL_TABLET | Freq: Two times a day (BID) | ORAL | 0 refills | Status: DC

## 2021-01-30 NOTE — Telephone Encounter (Signed)
Done

## 2021-02-03 ENCOUNTER — Other Ambulatory Visit: Payer: Self-pay

## 2021-02-03 ENCOUNTER — Ambulatory Visit (INDEPENDENT_AMBULATORY_CARE_PROVIDER_SITE_OTHER): Payer: 59 | Admitting: Psychiatry

## 2021-02-03 DIAGNOSIS — F331 Major depressive disorder, recurrent, moderate: Secondary | ICD-10-CM | POA: Diagnosis not present

## 2021-02-03 NOTE — Progress Notes (Signed)
[facility] Counselor/Therapist Progress Note  Patient ID: Sarah Hall, MRN: [id number],    Date: 02/03/2021  Time Spent:  58 minutes  Treatment Type: Individual Therapy  Reported Symptoms: anxiety, depression, angry, sad  Mental Status Exam:  Appearance:   Casual     Behavior:  Appropriate, Sharing, and Motivated  Motor:  Normal  Speech/Language:   Clear and Coherent  Affect:  Anxious, angry  Mood:  anxious, anger  Thought process:  goal directed  Thought content:    Some obsessiveness  Sensory/Perceptual disturbances:    WNL  Orientation:  oriented to person, place, time/date, situation, day of week, month of year, year, and stated date of Aug. 15, 2022  Attention:  Fair  Concentration:  Fair  Memory:  Patient reporting some short term memory issues and Dr is aware  Fund of knowledge:   Good  Insight:    Good and Fair  Judgment:   Good  Impulse Control:  Good and Fair   Risk Assessment: Danger to Self:  No Self-injurious Behavior: No Danger to Others: No Duty to Warn:no Physical Aggression / Violence:No  Access to Firearms a concern: No  Gang Involvement:No   Subjective:  Patient in today reporting things had been going better since last appt but weekend (past 2 days) things worsened.  Angry, sad, depressed, anxious over further communication re: her marriage breaking up. Feels it has heavily influenced her relationship with son as "he's had more time with his dad". Feels son doesn't respect her and their relationship. Hard for patient in situations she cannot control.  Needed session today to really work hard on these issues as she is struggling with this lack of power and control in the midst of a difficult divorce in process with her husband and her son fixing to leave the country to start school in [place].  Processed her fears and concerns but also her anger.  Denies any SI.  Very hard for her to look at the issue of letting go of the need to control, and  especially right now.  Hurt, frustrated, depressed, sad, Angry, and anxious other words she uses to describe her feelings.  Actually did a thorough job and venting them in session and as we summarized at end of session, tried to help her understand that so much of what is going on for her is out of her control but encouraged her not to "assume the worst case scenario" and to be open to seeing how some things occur and how some of it could positively impact her maybe not right away but in the future.  She did seem to understand that on some level but understandably is "in the present" with this being the last few days before her son goes to [place].  Struggles with issues of not being in control and also "things not being fair".  Understandable feelings for the situation she is in.  Does have friends locally that are supportive of her.   Interventions: Cognitive Behavioral Therapy, Ego-Supportive, and Insight-Oriented  Diagnosis:   ICD-10-CM   1. Moderate recurrent major depression ([place])  F33.1        Treatment goal plan :  Patient not signing treatment plan on computer screen due to Covid. Treatment goals: Treatment goals remain on treatment plan as patient works with strategies to achieve her goals. Progress is assessed each session and noted  in progress notes. Long term goal: Develop healthy cognitive patterns and beliefs about self and  the world that lead to alleviation and help prevent the relapse of depression symptoms. Short-term goal: Verbalize an understanding of the relationship between repressed anger and depressed mood.  Strategies: Use positive conflict resolution skills to resolve interpersonal discord and to make needs and expectations known.    Plan:  Patient today showing motivation and continues to struggle with things especially within the family not being in her control.  Understandably this frustrates her and angers her.  Needed a good part of session to vent and share  what she is feeling but also with some redirection to help her see the broader picture and that possibly there are some things that she does not fully understand yet.  Very angry and was able to discharge a good bit of this in session today.  Still difficult for patient to hear other possible points of view or ways of looking at the situation.  It seems that the situation has worsened this past week because of it being closer to when the son leaves for college abroad.  Did seem to help patient to discharge a lot of her anger in session and agreed to work more next visit on "where do I go from here?"  At that point her son will already have left for school, divorcing husband will be out of town at his new residence, and hoping the patient can work more intentionally on some much-needed resolution and eventual "letting go" of anger and resentment and figure out best ways for her to eventually move forward in a more positive direction.  Encouraged patient in her communication with others to be aware of "how I say what I say" so as to be better heard by others.  We will follow-up again next session on this and other aspects of her treatment goal plan that can help her feel more grounded in the midst of changes and rearranges that both saddened and anger her.  Review and progress/challenges noted with patient.  Next appointment within 2 weeks.   Shanon Ace, LCSW

## 2021-02-11 ENCOUNTER — Ambulatory Visit (INDEPENDENT_AMBULATORY_CARE_PROVIDER_SITE_OTHER): Payer: 59 | Admitting: Psychiatry

## 2021-02-11 ENCOUNTER — Other Ambulatory Visit: Payer: Self-pay

## 2021-02-11 DIAGNOSIS — F331 Major depressive disorder, recurrent, moderate: Secondary | ICD-10-CM | POA: Diagnosis not present

## 2021-02-11 NOTE — Progress Notes (Signed)
[facility] Counselor/Therapist Progress Note  Patient ID: Georgianna M Martinique, MRN: [id number],    Date: 02/11/2021  Time Spent: 58 minutes   Treatment Type: Individual Therapy  Reported Symptoms: anxiety, depression "some better"  Mental Status Exam:  Appearance:   Casual     Behavior:  Appropriate, Sharing, and Motivated  Motor:  Normal  Speech/Language:   Clear and Coherent  Affect:  anxious  Mood:  anxious and some depression  Thought process:  goal directed  Thought content:    Some obsessiveness  Sensory/Perceptual disturbances:    WNL  Orientation:  oriented to person, place, time/date, situation, day of week, month of year, year, and stated date of Aug. 23, 2022  Attention:  Poor  Concentration:  Poor  Memory:  WNL  Fund of knowledge:   Good  Insight:    Fair  Judgment:   Good and Fair  Impulse Control:  Good and Fair   Risk Assessment: Danger to Self:  No Self-injurious Behavior: No Danger to Others: No Duty to Warn:no Physical Aggression / Violence:No  Access to Firearms a concern: No  Gang Involvement:No   Subjective: Patient today reporting depression has lessened some, and anxiety "is a little better." Some anger re: "divorcing  husband" and some with her son who just left for school in [place]. Easily frustrated with actions of husband and having a difficult time working together as parents. Some decrease in her anxiety.  Sadness re: her marriage ending within next few month, likely first of next yr to sign papers for divorce. "I need to act versus react.". Feeling "lack of control and I want to control things, life feeling unfair." Anger and resentment. Worked today on her control, anger, resentment, and unfairness issues, using specific examples from patient. Does sense some progress as she vents very freely, and states she wants to not be so affected by the actions and words of her former husband and feel eventually more progress in moving forwards.  Trying not to keep score is much in regards to time son spends with father versus her, as this aggravates her relationship with son.  Focusing more on trying to help her with so many things that are out of her control, and control itself, is a huge issue for her.  She feels that she tries "to let go of things but then starts to pull them back in" versus truly letting go and releasing.  To continue working on this between sessions and will follow up again next time as we ran out of time today.  Is staying connected with friends locally that are part of her support system.   Interventions: Cognitive Behavioral Therapy, Solution-Oriented/Positive Psychology, and Ego-Supportive  Diagnosis:   ICD-10-CM   1. Moderate recurrent major depression ([place])  F33.1       Treatment goal plan :  Patient not signing treatment plan on computer screen due to Covid. Treatment goals: Treatment goals remain on treatment plan as patient works with strategies to achieve her goals. Progress is assessed each session and noted  in progress notes. Long term goal: Develop healthy cognitive patterns and beliefs about self and the world that lead to alleviation and help prevent the relapse of depression symptoms. Short-term goal: Verbalize an understanding of the relationship between repressed anger and depressed mood.  Strategies: Use positive conflict resolution skills to resolve interpersonal discord and to make needs and expectations known.     Plan: Patient today showing good motivation which seemed  to improve some during session as we worked on creasing her awareness, especially in some of her actions and reactions, and also working on the sensitive issue of "control" for her.  Frustrated and angry at the position she is in and very hard to let go of anger and resentment but states she wants to be able to do this.  Worked in session on "letting go" especially in difficult situations where there is lots of emotions  attached.  Can be resistant to this at times even though that is a stated goal, but that may be attributed in part, to the fact we are dealing with some long-term behaviors and she has acknowledged she does not really like not being in control and having to let go, but I think today the struggle has led her to realize she needs to let go, in order to move forward.  Worked on these issues most of session today and she did seem to try and push forward more and follow through and in discussion versus being dismissive.  Knows that it is going to be difficult and that she needs to be persistent in working on it.  Is frequently helped with some redirection, especially from being very narrowly focused on control.  Not as angry today.  Still some resentment and does not deny it which is helpful in working with her.  Still encouraging her with some communication skill issues including being very aware of "how we say what we say" so as to be better heard and understood by others.  Patient encouraged use some behaviors that could be helpful to her between sessions including good nutrition and exercise, staying in touch with people who are supportive of her, practicing positive self talk, intentionally looking for more positives than negatives daily, remaining on medication as prescribed, staying in the present focusing on what she can control, stop assuming worst case scenarios, practice more intentional listening to others, reduce her overthinking and overanalyzing, saying no when she needs to say no, interrupting anxious thoughts to challenge them and replace with more reality-based thoughts, and to feel good about the strength she is showing as she works with goal-directed behaviors in the midst of very challenging circumstances to try and move forward in a more positive direction of improved emotional health.  Review and progress/challenges noted with patient.  Next appointment within 2 weeks.   Shanon Ace,  LCSW

## 2021-02-13 ENCOUNTER — Encounter: Payer: Self-pay | Admitting: Gastroenterology

## 2021-02-26 ENCOUNTER — Ambulatory Visit (INDEPENDENT_AMBULATORY_CARE_PROVIDER_SITE_OTHER): Payer: 59 | Admitting: Psychiatry

## 2021-02-26 ENCOUNTER — Other Ambulatory Visit: Payer: Self-pay

## 2021-02-26 DIAGNOSIS — F331 Major depressive disorder, recurrent, moderate: Secondary | ICD-10-CM | POA: Diagnosis not present

## 2021-02-26 NOTE — Progress Notes (Signed)
[facility] Counselor/Therapist Progress Note  Patient ID: Sarah Hall, MRN: [id number],    Date: 02/26/2021  Time Spent: 58 minutes   Treatment Type: Individual Therapy  Reported Symptoms: anxiety, depression, "a roller coaster past couple weeks", helplessness, anger  Mental Status Exam:  Appearance:   Casual and Neat     Behavior:  Appropriate, Sharing, and Motivated  Motor:  Normal  Speech/Language:   Clear and Coherent and Normal Rate  Affect:  Depressed and anxious  Mood:  anxious and depressed  Thought process:  goal directed  Thought content:    Some obsessiveness  Sensory/Perceptual disturbances:    WNL  Orientation:  oriented to person, place, time/date, situation, day of week, month of year, year, and stated date of Sept. 7, 2022  Attention:  Good  Concentration:  Good and Fair  Memory:  WNL  Fund of knowledge:   Good  Insight:    Good and Fair  Judgment:   Good  Impulse Control:  Good and Fair   Risk Assessment: Danger to Self:  No Self-injurious Behavior: No Danger to Others: No Duty to Warn:no Physical Aggression / Violence:No  Access to Firearms a concern: No  Gang Involvement:No   Subjective: Patient today reporting anxiety and depression, with anxiety being more heightened. Hasnt' been to gym past 3 wks and plans to return soon "and that will help". Worked on some of her anger and resentment today especially with feelings towards her ex-husband, and big disappointment in her intended trip to see son not happening as planned. "I'm just upset and angry and frustrated and it's been going on 2 years and I am trying to heal more, get over the bumps in the road, "and am getting some better." "My focus right now is worse and influenced by my anger." States that time helps, in addition to talking and working through my feelings, in addition to feeling she's competing with other parent. Trying today to sort through lots of feelings and emotions and be heard,  as she tries to continue navigating this difficult time. Sad about her "pending divorce and dreading having to sign papers first of the year." Processed more about her control issues which are very strong today, as she's felt so out of control with all that's happening in her marital and personal life. "Letting go is hard" and she's realizing this as she "gets through each day, and try to let go more of my anger and resentment." Does have some good support for her close by.   Interventions: Solution-Oriented/Positive Psychology, Ego-Supportive, and Insight-Oriented  Diagnosis:   ICD-10-CM   1. Moderate recurrent major depression ([place])  F33.1        Treatment goal plan :  Patient not signing treatment plan on computer screen due to Covid. Treatment goals: Treatment goals remain on treatment plan as patient works with strategies to achieve her goals. Progress is assessed each session and noted  in progress notes. Long term goal: Develop healthy cognitive patterns and beliefs about self and the world that lead to alleviation and help prevent the relapse of depression symptoms. Short-term goal: Verbalize an understanding of the relationship between repressed anger and depressed mood.  Strategies: Use positive conflict resolution skills to resolve interpersonal discord and to make needs and expectations known.    Plan:  Patient today showing good motivation and engagement in session today as she worked on difficult family/marital/divorce-pending issues.  Important for patient right now is her being able to  communicate effectively in the midst of difficult feelings, and her continued work on "letting go" some in order to eventually move forward.  She explains that this is hard because she does not like to not be in control.  Control, is another issue we are addressing.  Encouraged her to practice positive behaviors that can be helpful to her between sessions including: Staying in touch with people  who are supportive, intentionally looking for more positives than negatives daily, good nutrition and exercise, practicing positive self talk, finding the positives within herself, remaining on her prescribed medication, staying in the present focusing on what she can control, practice more intentional listening to others, reduce her overthinking and overanalyzing, stop assuming worst case scenarios, saying no when she needs to say no, interrupting anxious thoughts to challenge them and replace with more reality-based thoughts, to work intentionally on "letting go", and recognize the strength she is showing as she works with goal-directed behaviors in the midst of very challenging circumstances as she tries to move forward in a more positive direction of improved emotional health.  Goal review and progress/challenges noted with patient.  Next appointment within 3 to 4 weeks, based on her travel schedule coming up.   Shanon Ace, LCSW

## 2021-03-04 ENCOUNTER — Ambulatory Visit: Payer: 59 | Admitting: Psychiatry

## 2021-03-06 ENCOUNTER — Encounter: Payer: Self-pay | Admitting: Family Medicine

## 2021-03-06 DIAGNOSIS — F900 Attention-deficit hyperactivity disorder, predominantly inattentive type: Secondary | ICD-10-CM

## 2021-03-06 NOTE — Telephone Encounter (Signed)
Please advise 

## 2021-03-07 ENCOUNTER — Telehealth: Payer: Self-pay

## 2021-03-07 DIAGNOSIS — G4700 Insomnia, unspecified: Secondary | ICD-10-CM

## 2021-03-07 MED ORDER — TEMAZEPAM 30 MG PO CAPS: 30.0000 mg | ORAL_CAPSULE | Freq: Every day | ORAL | 0 refills | Status: DC

## 2021-03-07 MED ORDER — AMPHETAMINE-DEXTROAMPHETAMINE 20 MG PO TABS: 20.0000 mg | ORAL_TABLET | Freq: Two times a day (BID) | ORAL | 0 refills | Status: DC

## 2021-03-07 NOTE — Telephone Encounter (Signed)
Lvm for patient that prescription has been sent to pharmacy

## 2021-03-07 NOTE — Telephone Encounter (Signed)
Done

## 2021-03-07 NOTE — Telephone Encounter (Signed)
Requesting refill for Temazepam '30mg'$  to be sent to [facility] .

## 2021-03-07 NOTE — Addendum Note (Signed)
Addended by: Alysia Penna A on: 03/07/2021 04:42 PM   Modules accepted: Orders

## 2021-03-07 NOTE — Telephone Encounter (Signed)
Patient called requesting Rx refill temazepam (RESTORIL) 30 MG capsule Pt needs 7 day supply pt wants Rx sent to [facility] pt does not want it sent through insurance and will pay cash for Rx

## 2021-03-10 ENCOUNTER — Ambulatory Visit: Payer: 59 | Admitting: Psychiatry

## 2021-03-25 ENCOUNTER — Ambulatory Visit (INDEPENDENT_AMBULATORY_CARE_PROVIDER_SITE_OTHER): Payer: 59 | Admitting: Psychiatry

## 2021-03-25 ENCOUNTER — Other Ambulatory Visit: Payer: Self-pay

## 2021-03-25 DIAGNOSIS — F331 Major depressive disorder, recurrent, moderate: Secondary | ICD-10-CM

## 2021-03-25 NOTE — Progress Notes (Signed)
[facility] Counselor/Therapist Progress Note  Patient ID: Sarah Hall, MRN: [id number],    Date: 03/25/2021  Time Spent: 60 minutes   Treatment Type: Individual Therapy  Reported Symptoms: Anxiety, depression getting better  Mental Status Exam:  Appearance:   Casual     Behavior:  Appropriate and Motivated  Motor:  Normal  Speech/Language:   Clear and Coherent  Affect:  Some depression, anxiety  Mood:  anxious and depression decreased some  Thought process:  goal directed  Thought content:    Overthinking, obsessiveness  Sensory/Perceptual disturbances:    WNL  Orientation:  oriented to person, place, time/date, situation, day of week, month of year, year, and stated date of Oct. 4, 2022  Attention:  Good  Concentration:  Good and Fair  Memory:  WNL  Fund of knowledge:   Good  Insight:    Good and Fair  Judgment:   Good  Impulse Control:  Good   Risk Assessment: Danger to Self:  No Self-injurious Behavior: No Danger to Others: No Duty to Warn:no Physical Aggression / Violence:No  Access to Firearms a concern: No  Gang Involvement:No   Subjective:  Patient in today reporting anxiety and depression (decreasing). Had traveled to [place] recently where her son is in school and had a safe trip but with some challenges. I was able to "find the positives" when obstacles arose. Proud of myself for getting through the obstacles. Discussed more today about concerns re: separation and pending divorce issues. Focusing also on better self-care and may start some painting again for therapeutic purposes. Continued work on her anger and resentment re: issues with separated husband and showing some improvement. "I know I'm still influenced right now by my anger and some resentment, and am continuing to work on this."  Feels  she is experiencing some "letting go" of some past but know I "still have more to let-go of". "Talking through my feelings and the passing of time helps  some." Trying to not over-react to things within family and be more patient with herself and others as she works hard to navigate this difficult time of her life.  Still working with control issues and trying to help her manage things that are out of her control and are hurtful.   Interventions: Solution-Oriented/Positive Psychology, Ego-Supportive, and Insight-Oriented  Diagnosis:   ICD-10-CM   1. Moderate recurrent major depression ([place])  F33.1       Treatment goal plan :  Patient not signing treatment plan on computer screen due to Covid. Treatment goals: Treatment goals remain on treatment plan as patient works with strategies to achieve her goals. Progress is assessed each session and noted  in progress notes. Long term goal: Develop healthy cognitive patterns and beliefs about self and the world that lead to alleviation and help prevent the relapse of depression symptoms. Short-term goal: Verbalize an understanding of the relationship between repressed anger and depressed mood.  Strategies: Use positive conflict resolution skills to resolve interpersonal discord and to make needs and expectations known.    Plan: Patient today showing good motivation and participation in session today and worked quite well on continued difficult family/marital/divorce-pending issues.  Still having some difficulty in the midst of strong and hurtful feelings to be able to communicate effectively and work more on "letting go" in order to eventually move forward.  Realizing a lot of things are out of her control, this is hard for patient as she has acknowledged she likes to  be in control.  Encouraged patient in practicing positive behaviors that have been helpful to her previously including: Staying in touch with people who are supportive, intentionally looking for more positives than negatives, good nutrition and exercise, practicing positive self talk, finding the positives within herself, remaining on her  prescribed medication, staying in the present focusing on what she can control, practice more intentional listening to others, reduce her overthinking and overanalyzing, stop assuming worst case scenarios, saying no when she needs to say no, interrupting anxious thoughts to challenge them and replace with more reality-based thoughts, to work intentionally on letting go, and recognize the strength that she shows as she works with goal-directed behaviors to move in a direction that supports improved emotional health and wellbeing.   Goal review and progress/challenges noted with patient.  Next appointment within 3 weeks.  This record has been created using [employer].  Chart creation errors have been sought, but may not always have been located and corrected.  Such creation errors do not reflect on the standard of medical care provided.    Shanon Ace, LCSW

## 2021-03-26 ENCOUNTER — Ambulatory Visit (INDEPENDENT_AMBULATORY_CARE_PROVIDER_SITE_OTHER): Payer: 59 | Admitting: Gastroenterology

## 2021-03-26 ENCOUNTER — Encounter: Payer: Self-pay | Admitting: Gastroenterology

## 2021-03-26 VITALS — BP 136/84 | HR 87 | Ht 63.0 in | Wt 120.0 lb

## 2021-03-26 DIAGNOSIS — R194 Change in bowel habit: Secondary | ICD-10-CM | POA: Diagnosis not present

## 2021-03-26 DIAGNOSIS — R197 Diarrhea, unspecified: Secondary | ICD-10-CM

## 2021-03-26 NOTE — Progress Notes (Signed)
History of Present Illness: This is a 60 year old female referred by Sarah Morale, MD for the evaluation of diarrhea, change in bowel habits.  She traveled to [place] in June and she developed acute diarrhea.  Her diarrhea slightly improved however she had persistent problems after returning to the [place] and she was treated with 10 days of Cipro without prompt improvement in symptoms.  Her diarrhea has has gradually improved over the past 2 months.  At this point she is having 1 or 2 loose stools every 2 weeks and other bowel movements are completely normal.  Her weight is stable and her appetite is good.  She underwent colonoscopy in 2016 showing mild diverticulosis and 2 small polyps which were benign polypoid colonic mucosa. Denies weight loss, abdominal pain, constipation, change in stool caliber, melena, hematochezia, nausea, vomiting, dysphagia, reflux symptoms, chest pain.     Allergies  Allergen Reactions   Codeine     Other reaction(s): Other Can take hydrocodone/Codeine cause anxiousness Can not take hydrocodone/Codeine cause anxiousness   Codeine Phosphate     Can not take hydrocodone/Codeine cause anxiousness   Outpatient Medications Prior to Visit  Medication Sig Dispense Refill   Aspirin-Acetaminophen-Caffeine (GOODYS EXTRA STRENGTH PO) Take 1 packet by mouth daily as needed (pain).     azelastine (ASTELIN) 0.1 % nasal spray USE 2 SPRAYS IN EACH NOSTRIL TWICE DAILY AS DIRECTED 30 mL 3   ibuprofen (ADVIL) 200 MG tablet Take 200 mg by mouth every 8 (eight) hours as needed for mild pain.     levothyroxine (SYNTHROID, LEVOTHROID) 88 MCG tablet Take 88 mcg by mouth daily before breakfast.  1   lisinopril (ZESTRIL) 10 MG tablet TAKE 1 TABLET(10 MG) BY MOUTH DAILY 30 tablet 3   magnesium 30 MG tablet Take 300 mg by mouth daily.      Multiple Vitamin (MULTIVITAMIN) tablet Take 1 tablet by mouth daily.     tamoxifen (NOLVADEX) 10 MG tablet Take 1 tablet (10 mg total) by mouth  daily. 90 tablet 3   temazepam (RESTORIL) 30 MG capsule Take 1 capsule (30 mg total) by mouth at bedtime. 7 capsule 0   amphetamine-dextroamphetamine (ADDERALL) 20 MG tablet Take 1 tablet (20 mg total) by mouth 2 (two) times daily for 15 days. 30 tablet 0   atovaquone-proguanil (MALARONE) 250-100 MG TABS tablet Take 1 tablet by mouth daily. 25 tablet 0   No facility-administered medications prior to visit.   Past Medical History:  Diagnosis Date   ADHD (attention deficit hyperactivity disorder), inattentive type    Ankle sprain    right ankle   Breast cancer ([place])    Deep breathing    hard to take a deep breath due to thyroid issue   Depression    Bipolar depression, sees Sarah Clines NP    Hypothyroidism    cared for by Dr. Ronita Hall    Past Surgical History:  Procedure Laterality Date   BREAST CYST EXCISION Right 07/05/2020   Procedure: EVACUATION OF RIGHT BREAST HEMATOMA;  Surgeon: Sarah Mesa, MD;  Location: [facility];  Service: General;  Laterality: Right;  LMA   BREAST LUMPECTOMY WITH RADIOACTIVE SEED AND SENTINEL LYMPH NODE BIOPSY Right 07/02/2020   Procedure: RIGHT BREAST LUMPECTOMY WITH RADIOACTIVE SEED AND RIGHT AXILLARY SENTINEL LYMPH NODE BIOPSY, BLUE DYE INJECTION;  Surgeon: Sarah Mesa, MD;  Location: [facility];  Service: General;  Laterality: Right;  PEC BLOCK, BLUE DYE INJECTION   COLONOSCOPY  09/03/2014  per Dr. Fuller Hall, benign polyps, repeat in 10 yrs    DILATION AND EVACUATION      2 times   scraping of uterus     20 years ago   WISDOM TOOTH EXTRACTION     Social History   Socioeconomic History   Marital status: Legally Separated    Spouse name: Not on file   Number of children: 1   Years of education: Not on file   Highest education level: Not on file  Occupational History   Not on file  Tobacco Use   Smoking status: Never   Smokeless tobacco: Never  Vaping Use   Vaping Use: Never used  Substance and Sexual Activity   Alcohol use: Yes     Alcohol/week: 10.0 standard drinks    Types: 10 Glasses of wine per week    Comment: social   Drug use: No   Sexual activity: Not on file  Other Topics Concern   Not on file  Social History Narrative   Not on file   Social Determinants of Health   Financial Resource Strain: Not on file  Food Insecurity: Not on file  Transportation Needs: Not on file  Physical Activity: Not on file  Stress: Not on file  Social Connections: Not on file   Family History  Problem Relation Age of Onset   Alzheimer's disease Father    Breast cancer Paternal Grandmother    Colon cancer Maternal Uncle    Uterine cancer Maternal Grandmother    Esophageal cancer Neg Hx    Rectal cancer Neg Hx    Stomach cancer Neg Hx       Review of Systems: Pertinent positive and negative review of systems were noted in the above HPI section. All other review of systems were otherwise negative.    Physical Exam: General: Well developed, well nourished, no acute distress Head: Normocephalic and atraumatic Eyes: Sclerae anicteric, EOMI Ears: Normal auditory acuity Mouth: Not examined, mask on during Covid-19 pandemic Neck: Supple, no masses or thyromegaly Lungs: Clear throughout to auscultation Heart: Regular rate and rhythm; no murmurs, rubs or bruits Abdomen: Soft, non tender and non distended. No masses, hepatosplenomegaly or hernias noted. Normal Bowel sounds Rectal: Not done Musculoskeletal: Symmetrical with no gross deformities  Skin: No lesions on visible extremities Pulses:  Normal pulses noted Extremities: No clubbing, cyanosis, edema or deformities noted Neurological: Alert oriented x 4, grossly nonfocal Cervical Nodes:  No significant cervical adenopathy Inguinal Nodes: No significant inguinal adenopathy Psychological:  Alert and cooperative. Normal mood and affect   Assessment and Recommendations:  Diarrhea, change in bowel habits.  Her diarrhea has almost completely resolved.  This was  very likely postinfectious diarrhea.  Without other alarm features such as weight loss, rectal bleeding, abdominal pain it is reasonable to continue to observe and if her symptoms fail to completely resolve proceed with colonoscopy.  We also discussed proceeding with colonoscopy at this time for further evaluation.  She will consider the options and call if she wants to proceed with colonoscopy.  Advised her to watch for certain foods or beverages that may trigger intermittent diarrhea.  If she does not proceed with colonoscopy at this time a 10-year interval screening colonoscopy is recommended in March 2026.   cc: Sarah Morale, MD [address] [place],  [place] [id number]

## 2021-03-26 NOTE — Patient Instructions (Signed)
Call back if your symptoms are not better so we can proceed with colonoscopy.   Due to recent changes in healthcare laws, you may see the results of your imaging and laboratory studies on MyChart before your provider has had a chance to review them.  We understand that in some cases there may be results that are confusing or concerning to you. Not all laboratory results come back in the same time frame and the provider may be waiting for multiple results in order to interpret others.  Please give [place] 48 hours in order for your provider to thoroughly review all the results before contacting the office for clarification of your results.   The [facility] providers would like to encourage you to use [facility] to communicate with providers for non-urgent requests or questions.  Due to long hold times on the telephone, sending your provider a message by [facility] may be a faster and more efficient way to get a response.  Please allow 48 business hours for a response.  Please remember that this is for non-urgent requests.   Thank you for choosing me and [facility].  Pricilla Riffle. Dagoberto Ligas., MD., [facility] Regal

## 2021-03-31 ENCOUNTER — Ambulatory Visit: Payer: 59 | Attending: Surgery

## 2021-03-31 ENCOUNTER — Other Ambulatory Visit: Payer: Self-pay

## 2021-03-31 VITALS — Wt 119.4 lb

## 2021-03-31 DIAGNOSIS — Z483 Aftercare following surgery for neoplasm: Secondary | ICD-10-CM | POA: Insufficient documentation

## 2021-03-31 NOTE — Therapy (Signed)
[facility] @ [place], [place], [address] Phone: [phone]   Fax:  [phone]  Physical Therapy Treatment  Patient Details  Name: Sarah Hall MRN: [id number] Date of Birth: 1961-06-10 Referring Provider (PT): Dr. Donnie Mesa   Encounter Date: 03/31/2021   PT End of Session - 03/31/21 1534     Visit Number 2   # unchanged due to screen only   PT Start Time 1532    PT Stop Time 1537    PT Time Calculation (min) 5 min    Activity Tolerance Patient tolerated treatment well    Behavior During Therapy [facility] for tasks assessed/performed             Past Medical History:  Diagnosis Date   ADHD (attention deficit hyperactivity disorder), inattentive type    Ankle sprain    right ankle   Breast cancer ([place])    Deep breathing    hard to take a deep breath due to thyroid issue   Depression    Bipolar depression, sees Metta Clines NP    Hypothyroidism    cared for by Dr. Ronita Hipps     Past Surgical History:  Procedure Laterality Date   BREAST CYST EXCISION Right 07/05/2020   Procedure: EVACUATION OF RIGHT BREAST HEMATOMA;  Surgeon: Donnie Mesa, MD;  Location: [facility];  Service: General;  Laterality: Right;  LMA   BREAST LUMPECTOMY WITH RADIOACTIVE SEED AND SENTINEL LYMPH NODE BIOPSY Right 07/02/2020   Procedure: RIGHT BREAST LUMPECTOMY WITH RADIOACTIVE SEED AND RIGHT AXILLARY SENTINEL LYMPH NODE BIOPSY, BLUE DYE INJECTION;  Surgeon: Donnie Mesa, MD;  Location: [facility];  Service: General;  Laterality: Right;  PEC BLOCK, BLUE DYE INJECTION   COLONOSCOPY  09/03/2014   per Dr. Fuller Plan, benign polyps, repeat in 10 yrs    DILATION AND EVACUATION      2 times   scraping of uterus     20 years ago   [place]:   03/31/21 1534  Weight: 119 lb 6 oz (54.1 kg)     Subjective Assessment - 03/31/21 1534     Subjective Pt returns for her 3 month L-Dex  screen.    Pertinent History Patient was diagnosed on 05/15/2020 with right DCIS. She underwent a right lumpectomy and sentinel node biopsy on 07/02/2020 with 2 negative nodes removed. It is ER positive and PR negative.                    L-DEX FLOWSHEETS - 03/31/21 1500       L-DEX LYMPHEDEMA SCREENING   Measurement Type Unilateral    L-DEX MEASUREMENT EXTREMITY Upper Extremity    POSITION  Standing    DOMINANT SIDE Right    At Risk Side Right    BASELINE SCORE (UNILATERAL) -1.2    L-DEX SCORE (UNILATERAL) -1.2    VALUE CHANGE (UNILAT) 0                                     PT Long Term Goals - 08/01/20 0950       PT LONG TERM GOAL #1   Title Patient will demonstrate she has regained full shoulder ROM and function post operatively compared to baselines.    Time 8    Period Weeks    Status Achieved  Plan - 03/31/21 1535     Clinical Impression Statement Pt returns for her 3 month L-Dex screen. Her change from baseline of 0 is WNLs o no further treatment is required at this time except tp cont every 3 month L-Dex screens which pt is agreeable to.    PT Next Visit Plan Cont every 3 month L-Dex screens for up to 2 years from [facility] (~07/02/22)    Consulted and Agree with Plan of Care Patient             Patient will benefit from skilled therapeutic intervention in order to improve the following deficits and impairments:     Visit Diagnosis: Aftercare following surgery for neoplasm     Problem List Patient Active Problem List   Diagnosis Date Noted   Primary hypertension 10/02/2020   Ductal carcinoma in situ (DCIS) of right breast 05/20/2020   Acquired hypothyroidism 06/09/2018   Seasonal allergies 06/09/2018   Depression, recurrent ([place]) 06/09/2018   Insomnia 01/15/2015   Low back pain 08/20/2014   Allergic rhinitis 02/22/2014   ADD (attention deficit disorder) 12/31/2011   Perimenopausal vasomotor  symptoms 12/31/2011   Fibrocystic breast disease 04/14/2011    Otelia Limes, PTA 03/31/2021, 3:38 PM  [facility] @ [place], [place], [address] Phone: [phone]   Fax:  [phone]  Name: Sarah Hall MRN: [id number] Date of Birth: 07/26/1960

## 2021-03-31 NOTE — Progress Notes (Signed)
Patient Care Team: Laurey Morale, MD as PCP - General (Family Medicine) Donnie Mesa, MD as Consulting Physician (General Surgery) Nicholas Lose, MD as Consulting Physician (Hematology and Oncology) Kyung Rudd, MD as Consulting Physician (Radiation Oncology)  DIAGNOSIS:    ICD-10-CM   1. Ductal carcinoma in situ (DCIS) of right breast  D05.11 MM DIAG BREAST TOMO BILATERAL      SUMMARY OF ONCOLOGIC HISTORY: Oncology History  Ductal carcinoma in situ (DCIS) of right breast  05/20/2020 Initial Diagnosis   Screening mammogram showed right breast calcifications. Diagnostic mammogram showed a 1.0cm group of calcifications in the upper outer right breast. Biopsy showed DCIS, high grade, ER+ 5%, PR- 0%.    07/02/2020 Surgery   Right lumpectomy ([place]) ([id number]): high grade DCIS, clear margins, 2 right axillary lymph nodes negative for carcinoma.   06/2020 -  Anti-estrogen oral therapy   Tamoxifen   08/28/2020 - 09/25/2020 Radiation Therapy   The patient initially received a dose of 42.56 Gy in 16 fractions to the breast using whole-breast tangent fields. This was delivered using a 3-D conformal technique. The pt received a boost delivering an additional 8 Gy in 4 fractions using a electron boost with 35meV electrons. The total dose was 50.56 Gy.     CHIEF COMPLIANT: Follow-up of right breast cancer, intolerance to tamoxifen  INTERVAL HISTORY: Sarah Hall is a 60 y.o. with above-mentioned history of right breast cancer having undergone lumpectomy. She presents to the clinic today for follow-up to discuss adjuvant antiestrogen therapy plan.  Initially she tolerated tamoxifen extremely well.  Recently she has developed joint stiffness especially in the wrists elbows and knees.  This got better after she stopped tamoxifen in July.  Although she still feels slight achiness overall she is doing much better.  Her quality of life was worse when she was having all the pain.  ALLERGIES:   is allergic to codeine and codeine phosphate.  MEDICATIONS:  Current Outpatient Medications  Medication Sig Dispense Refill   amphetamine-dextroamphetamine (ADDERALL) 20 MG tablet Take 1 tablet (20 mg total) by mouth 2 (two) times daily for 15 days. 30 tablet 0   Aspirin-Acetaminophen-Caffeine (GOODYS EXTRA STRENGTH PO) Take 1 packet by mouth daily as needed (pain).     azelastine (ASTELIN) 0.1 % nasal spray USE 2 SPRAYS IN EACH NOSTRIL TWICE DAILY AS DIRECTED 30 mL 3   ibuprofen (ADVIL) 200 MG tablet Take 200 mg by mouth every 8 (eight) hours as needed for mild pain.     levothyroxine (SYNTHROID, LEVOTHROID) 88 MCG tablet Take 88 mcg by mouth daily before breakfast.  1   lisinopril (ZESTRIL) 10 MG tablet TAKE 1 TABLET(10 MG) BY MOUTH DAILY 30 tablet 3   magnesium 30 MG tablet Take 300 mg by mouth daily.      Multiple Vitamin (MULTIVITAMIN) tablet Take 1 tablet by mouth daily.     tamoxifen (NOLVADEX) 10 MG tablet Take 1 tablet (10 mg total) by mouth daily. 90 tablet 3   temazepam (RESTORIL) 30 MG capsule Take 1 capsule (30 mg total) by mouth at bedtime. 7 capsule 0   No current facility-administered medications for this visit.    PHYSICAL EXAMINATION: ECOG PERFORMANCE STATUS: 1 - Symptomatic but completely ambulatory  Vitals:   04/01/21 1525  BP: (!) 146/82  Pulse: 92  Resp: 18  Temp: 97.7 F (36.5 C)  SpO2: 100%   Filed Weights   04/01/21 1525  Weight: 119 lb 3.2 oz (54.1 kg)  BREAST: No palpable masses or nodules in either right or left breasts. No palpable axillary supraclavicular or infraclavicular adenopathy no breast tenderness or nipple discharge. (exam performed in the presence of a chaperone)  LABORATORY DATA:  I have reviewed the data as listed CMP Latest Ref Rng & Units 05/22/2020 01/08/2020 08/16/2018  Glucose 70 - 99 mg/dL 95 91 82  BUN 6 - 20 mg/dL 17 10 14   Creatinine 0.44 - 1.00 mg/dL 0.75 0.59 0.62  Sodium 135 - 145 mmol/L 141 139 139  Potassium 3.5 -  5.1 mmol/L 3.6 4.0 4.3  Chloride 98 - 111 mmol/L 105 101 101  CO2 22 - 32 mmol/L 30 29 29   Calcium 8.9 - 10.3 mg/dL 9.3 9.9 9.0  Total Protein 6.5 - 8.1 g/dL 6.8 6.9 6.7  Total Bilirubin 0.3 - 1.2 mg/dL 0.7 0.6 0.5  Alkaline Phos 38 - 126 U/L 74 - 79  AST 15 - 41 U/L 27 21 28   ALT 0 - 44 U/L 28 18 25     Lab Results  Component Value Date   WBC 6.3 05/22/2020   HGB 11.5 (L) 05/22/2020   HCT 35.2 (L) 05/22/2020   MCV 92.9 05/22/2020   PLT 219 05/22/2020   NEUTROABS 3.3 05/22/2020    ASSESSMENT & PLAN:  Ductal carcinoma in situ (DCIS) of right breast 07/02/2020: Right lumpectomy: High-grade DCIS, margins clear, 2 right axillary lymph nodes negative, ER 5%, PR 0% Tis N0 stage 0 Postoperative hematoma: Required evacuation.   Treatment plan: 1. Adjuvant radiation therapy completed 09/25/2020 2. Adjuvant tamoxifen (patient took tamoxifen preoperatively because of the slight delay in surgery).      Tamoxifen toxicities: Joint stiffness in her wrists and elbows and knees.  They got better once she stopped tamoxifen 01/17/2021. Now resuming tamoxifen at 5 mg dosage 04/01/2021  Breast Cancer Surveillance: 1. Breast Exam: 04/01/2021: Benign 2. Mammogram: Annually in November 2022 I sent an order for mammogram to be done at the breast center.   She is very excited that her son got accepted to [facility] where he will spend 1 year in [place]. Return to clinic in 1 month for telephone visit   Orders Placed This Encounter  Procedures   MM DIAG BREAST TOMO BILATERAL    Standing Status:   Future    Standing Expiration Date:   04/01/2022    Order Specific Question:   Reason for Exam (SYMPTOM  OR DIAGNOSIS REQUIRED)    Answer:   Annual mammograms with H/O Breast cancer    Order Specific Question:   Is the patient pregnant?    Answer:   No    Order Specific Question:   Preferred imaging location?    Answer:   [facility]    Order Specific Question:   Release to patient     Answer:   Immediate    The patient has a good understanding of the overall plan. she agrees with it. she will call with any problems that may develop before the next visit here.  Total time spent: 20 mins including face to face time and time spent for planning, charting and coordination of care  Rulon Eisenmenger, MD, MPH 04/01/2021  I, Thana Ates, am acting as scribe for Dr. Nicholas Lose.  All night with hot flashes at and about at least about half the time but I do know that this joint stiffness that mean I hesitate to go on letrozole which include has high rate of arthritic and muscle  stiffness so will be much worse  I have reviewed the above documentation for accuracy and completeness, and I agree with the above.

## 2021-04-01 ENCOUNTER — Other Ambulatory Visit: Payer: Self-pay

## 2021-04-01 ENCOUNTER — Inpatient Hospital Stay: Payer: 59 | Attending: Hematology and Oncology | Admitting: Hematology and Oncology

## 2021-04-01 DIAGNOSIS — M25669 Stiffness of unspecified knee, not elsewhere classified: Secondary | ICD-10-CM | POA: Insufficient documentation

## 2021-04-01 DIAGNOSIS — D0511 Intraductal carcinoma in situ of right breast: Secondary | ICD-10-CM | POA: Diagnosis present

## 2021-04-01 DIAGNOSIS — M25639 Stiffness of unspecified wrist, not elsewhere classified: Secondary | ICD-10-CM | POA: Diagnosis not present

## 2021-04-01 DIAGNOSIS — Z79810 Long term (current) use of selective estrogen receptor modulators (SERMs): Secondary | ICD-10-CM | POA: Insufficient documentation

## 2021-04-01 NOTE — Assessment & Plan Note (Signed)
07/02/2020: Right lumpectomy: High-grade DCIS, margins clear, 2 right axillary lymph nodes negative, ER 5%, PR 0% Tis N0stage 0 Postoperative hematoma: Required evacuation.  Treatment plan: 1.Adjuvant radiation therapy completed 09/25/2020 2.Adjuvant tamoxifen (patient took tamoxifen preoperatively because of the slight delay in surgery). No major adverse effects with tamoxifen   Tamoxifen toxicities: Occasional hot flashes but otherwise tolerating it well.  Breast Cancer Surveillance: 1. Breast Exam: 04/01/2021: Benign 2. Mammogram: Annually in November 2022  She is very excited that her son got accepted to [facility] where he will spend 1 year in [place]. Return to clinic in 1 year for follow-up

## 2021-04-02 ENCOUNTER — Telehealth: Payer: Self-pay | Admitting: Gastroenterology

## 2021-04-02 DIAGNOSIS — R197 Diarrhea, unspecified: Secondary | ICD-10-CM

## 2021-04-02 DIAGNOSIS — R194 Change in bowel habit: Secondary | ICD-10-CM

## 2021-04-02 NOTE — Telephone Encounter (Signed)
Sorry, meant to send to [facility]

## 2021-04-02 NOTE — Telephone Encounter (Signed)
Inbound call from patient. Asking if she can go ahead to schedule her colonoscopy because her diarrhea have come back and she is going to the bathroom around every 30 minutes, along with nausea.

## 2021-04-04 NOTE — Telephone Encounter (Signed)
Left message for patient to call back  

## 2021-04-08 ENCOUNTER — Ambulatory Visit (INDEPENDENT_AMBULATORY_CARE_PROVIDER_SITE_OTHER): Payer: 59 | Admitting: Psychiatry

## 2021-04-08 ENCOUNTER — Other Ambulatory Visit: Payer: Self-pay

## 2021-04-08 DIAGNOSIS — F331 Major depressive disorder, recurrent, moderate: Secondary | ICD-10-CM

## 2021-04-08 NOTE — Progress Notes (Signed)
[facility] Counselor/Therapist Progress Note  Patient ID: Sarah Hall, MRN: [id number],    Date: 04/08/2021  Time Spent: 57 minutes   Treatment Type: Individual Therapy  Reported Symptoms: anxiety, depression is main symptom "but improving", frustration  Mental Status Exam:  Appearance:   Casual     Behavior:  Appropriate, Sharing, and Motivated  Motor:  Normal  Speech/Language:   Clear and Coherent  Affect:  anxious  Mood:  anxious  Thought process:  goal directed  Thought content:    WNL  Sensory/Perceptual disturbances:    WNL  Orientation:  oriented to person, place, time/date, situation, day of week, month of year, year, and stated date of Oct. 18, 2022  Attention:  Fair  Concentration:  Fair  Memory:  [name] of knowledge:   Good  Insight:    Good and Fair  Judgment:   Good and Fair  Impulse Control:  Good   Risk Assessment: Danger to Self:  No Self-injurious Behavior: No Danger to Others: No Duty to Warn:no Physical Aggression / Violence:No  Access to Firearms a concern: No  Gang Involvement:No   Subjective: Patient in today reporting depression and some anxiety "but getting better."  Continued work on her conflicting feelings, depression, anxiety, and stressors related to her current process of pending divorce. Processes some issues surrounding this that contribute to patient's frustration, depression, and anxiety. Feels talking definitely helps, as well as time passing. Doing better not over-reacting as she navigates this time in her life. Looked at some resentments she has been feeling as well. Also talked through some anger today related to divorce. "I try to look for positives every day but some days I just don't see them and it's a struggle."  "Wish I could forget but I can't and I do think things will get better." Processed more today "the loss of her husband and my best friend" per pending divorce. Working with "control issues" and how to "let  go in order to move forward." Does feel proud of herself for handling this situation and getting through the obstacles each day. Ongoing work on her anger as she doesn't want to be bitter. Does feel part of her is "doing some letting go."    Interventions: Solution-Oriented/Positive Psychology, Ego-Supportive, and Insight-Oriented  Diagnosis:   ICD-10-CM   1. Moderate recurrent major depression ([name])  F33.1      Treatment goal plan :  Patient not signing treatment plan on computer screen due to Covid. Treatment goals: Treatment goals remain on treatment plan as patient works with strategies to achieve her goals. Progress is assessed each session and noted  in progress notes. Long term goal: Develop healthy cognitive patterns and beliefs about self and the world that lead to alleviation and help prevent the relapse of depression symptoms. Short-term goal: Verbalize an understanding of the relationship between repressed anger and depressed mood.  Strategies: Use positive conflict resolution skills to resolve interpersonal discord and to make needs and expectations known.    Plan: Patient today showing good motivation and active participation in session as she worked further on her pending divorce issues of anger, resentment, hurt, sadness, anxiety, depression, stress, acceptance, improved communication (including active listening and "paying attention to how we say what we say"), dealing with the fact a lot of things are out of her control, some gradual "letting go", and healing self-care.  Encouraged patient to continue practicing positive behaviors that can be helpful to her including: Practicing  positive self talk, staying in touch with supportive people, intentionally looking for more positives than negatives each day, good nutrition and exercise, finding the positives within herself, staying on her prescribed medication, remaining in the present focusing on what she can change or control,  practicing more intentional listening to others, reducing her overthinking and over analyzing, stop assuming worst case scenarios, saying no when she needs to say no, interrupting anxious thoughts to challenge them and replace them with more reality-based thoughts, work intentionally on letting go, believing in herself, and recognize the strength that she shows working with goal-directed behaviors to move in a direction that supports overall wellbeing and improved emotional health.  Goal review and progress/challenges noted with patient.  Next appt within 2-3 weeks.  This record has been created using [employer].  Chart creation errors have been sought, but may not always have been located and corrected.  Such creation errors do not reflect on the standard of medical care provided.    Shanon Ace, LCSW

## 2021-04-08 NOTE — Telephone Encounter (Signed)
Left message for patient to call back  

## 2021-04-09 ENCOUNTER — Other Ambulatory Visit: Payer: Self-pay | Admitting: Family Medicine

## 2021-04-09 DIAGNOSIS — G4700 Insomnia, unspecified: Secondary | ICD-10-CM

## 2021-04-09 MED ORDER — NA SULFATE-K SULFATE-MG SULF 17.5-3.13-1.6 GM/177ML PO SOLN: ORAL | 0 refills | Status: DC

## 2021-04-09 NOTE — Telephone Encounter (Signed)
Pt LOV was  on 01/06/2021 Last refill was done on 03/07/2021 Please advise

## 2021-04-09 NOTE — Telephone Encounter (Signed)
Patient has been scheduled for colonoscopy per Office notes with Dr. Fuller Plan on 03/26/2021.  Instructions mailed to the patient's home.  She will call if she has questions.

## 2021-04-17 ENCOUNTER — Other Ambulatory Visit: Payer: Self-pay

## 2021-04-17 ENCOUNTER — Ambulatory Visit (INDEPENDENT_AMBULATORY_CARE_PROVIDER_SITE_OTHER): Payer: 59 | Admitting: Psychiatry

## 2021-04-17 DIAGNOSIS — F331 Major depressive disorder, recurrent, moderate: Secondary | ICD-10-CM

## 2021-04-17 NOTE — Progress Notes (Signed)
[facility] Counselor/Therapist Progress Note  Patient ID: Sarah Hall, MRN: [id number],    Date: 04/17/2021  Time Spent: 55 minutes   Treatment Type: Individual Therapy  Reported Symptoms: depression "but improving", anxiety, stressed, sadness, resentful  Mental Status Exam:  Appearance:   Neat     Behavior:  Appropriate, Sharing, and Motivated  Motor:  Normal  Speech/Language:   Clear and Coherent  Affect:  Depressed and anxiety, sad  Mood:  anxious, depressed, and sad  Thought process:  goal directed  Thought content:    Obsessions  Sensory/Perceptual disturbances:    WNL  Orientation:  oriented to person, place, time/date, situation, day of week, month of year, year, and stated date of Oct. 27, 2022  Attention:  Fair  Concentration:  Fair  Memory:  [place] of knowledge:   Good  Insight:    Fair  Judgment:   Good  Impulse Control:  Good   Risk Assessment: Danger to Self:  No Self-injurious Behavior: No Danger to Others: No Duty to Warn:no Physical Aggression / Violence:No  Access to Firearms a concern: No  Gang Involvement:No   Subjective: Patient in for appointment today reporting anxiety, stressed, depression, sadness, and resentfulness. Continues working through issues regarding her marital separation and upcoming divorce. Stressed and feeling more tired sometimes. "Have always had difficulty sleeping, denies any sleep apnea or other issues."  Doctor has provided sleeping aid. Venting especially her depression, anxiety, and frustrations with her current status and anticipating her divorce. Also processing some unresolved resentment which she stated was helpful today. Working to let go of more anger and resentment so that she can eventually move forward. Difficulty in "letting go" and agrees to work on this more intentionally.  Hard for patient to let go and move forward but is trying and is a work in progress. Talked through some resentments and seem to  feel less anger by session end.  As that she is using some of her gym time to work out resentments as well.  Interventions: Ego-Supportive and Insight-Oriented  Diagnosis:   ICD-10-CM   1. Moderate recurrent major depression ([place])  F33.1       Treatment goal plan :  Patient not signing treatment plan on computer screen due to Covid. Treatment goals: Treatment goals remain on treatment plan as patient works with strategies to achieve her goals. Progress is assessed each session and noted  in progress notes. Long term goal: Develop healthy cognitive patterns and beliefs about self and the world that lead to alleviation and help prevent the relapse of depression symptoms. Short-term goal: Verbalize an understanding of the relationship between repressed anger and depressed mood.  Strategies: Use positive conflict resolution skills to resolve interpersonal discord and to make needs and expectations known.   Plan:   Patient today showing good motivation and was very engaged in session today as she continued work on her anxiety, depression, anger, and resentments regarding her marital separation and pending divorce.  Is making some progress in several ways including her concentration being better.  Has been taking some medication to help sleep but she wants to try some meditation instead as she does not want to depend on medication long-term to help sleep.  There is a particular meditation that she is looking for and had seen in the past so she is hoping to find that.  Also mentions some resources for her along the same lines.  Is having to deal with a lot  of things being out of her control which is very difficult for her but she seems to be managing some better.  Encouraged patient and her practice of positive behaviors that have been helpful to her previously including: Staying in touch with supportive people, intentionally looking for more positives and negatives each day, practicing consistent  positive self talk, good nutrition and exercise, finding the positives within herself, staying on her prescribed medication, remaining in the present focusing on what she can control or change, practicing more intentional listening to others, reducing her overthinking and over analyzing, stop assuming worst case scenarios, saying no when she really needs to say no, interrupting anxious thoughts to challenge them and replace with more reality-based and empowering thoughts, work intentionally on letting go more of the past, believing in herself, and recognize the strength that she shows working with goal-directed behaviors to move in a direction that supports improved emotional health and overall wellbeing.  Goal review and progress noted with patient.  Next appointment within 2 to 3 weeks.  This record has been created using [employer].  Chart creation errors have been sought, but may not always have been located and corrected.  Such creation errors do not reflect on the standard of medical care provided.    Shanon Ace, LCSW

## 2021-04-30 NOTE — Progress Notes (Signed)
  HEMATOLOGY-ONCOLOGY TELEPHONE VISIT PROGRESS NOTE  I connected with Sarah Hall on 05/01/2021 at 10:30 AM EST by telephone and verified that I am speaking with the correct person using two identifiers.  I discussed the limitations, risks, security and privacy concerns of performing an evaluation and management service by telephone and the availability of in person appointments.  I also discussed with the patient that there may be a patient responsible charge related to this service. The patient expressed understanding and agreed to proceed.   History of Present Illness: Sarah Hall is a 60 y.o. female with  history of right breast cancer status post lumpectomy and radiation and is currently on tamoxifen.. She presents via telephone today for follow-up.  She could not tolerate the 10 mg of tamoxifen but she is able to tolerate the 5 mg tamoxifen reasonably well.  She does still have some joint stiffness and achiness but its not and debilitating as it used to be before.  Oncology History  Ductal carcinoma in situ (DCIS) of right breast  05/20/2020 Initial Diagnosis   Screening mammogram showed right breast calcifications. Diagnostic mammogram showed a 1.0cm group of calcifications in the upper outer right breast. Biopsy showed DCIS, high grade, ER+ 5%, PR- 0%.    07/02/2020 Surgery   Right lumpectomy ([facility]) ([id number]): high grade DCIS, clear margins, 2 right axillary lymph nodes negative for carcinoma.   06/2020 -  Anti-estrogen oral therapy   Tamoxifen   08/28/2020 - 09/25/2020 Radiation Therapy   The patient initially received a dose of 42.56 Gy in 16 fractions to the breast using whole-breast tangent fields. This was delivered using a 3-D conformal technique. The pt received a boost delivering an additional 8 Gy in 4 fractions using a electron boost with 35meV electrons. The total dose was 50.56 Gy.     Observations/Objective:     Assessment Plan:  Ductal carcinoma in  situ (DCIS) of right breast 07/02/2020: Right lumpectomy: High-grade DCIS, margins clear, 2 right axillary lymph nodes negative, ER 5%, PR 0% Tis N0 stage 0 Postoperative hematoma: Required evacuation.   Treatment plan: 1. Adjuvant radiation therapy completed 09/25/2020 2. Adjuvant tamoxifen (patient took tamoxifen preoperatively because of the slight delay in surgery).      Tamoxifen toxicities: Joint stiffness in her wrists and elbows and knees.  They got better once she stopped tamoxifen 01/17/2021. Now resuming tamoxifen at 5 mg dosage 04/01/2021 Symptoms are better in spite of 5 mg Tamoxifen   Breast Cancer Surveillance: 1. Breast Exam: 04/01/2021: Benign 2. Mammogram: Annually in 05/20/2021     She is very excited that her son got accepted to [facility] where he will spend 1 year in [place]. Return to clinic in 1 year for follow-up    I discussed the assessment and treatment plan with the patient. The patient was provided an opportunity to ask questions and all were answered. The patient agreed with the plan and demonstrated an understanding of the instructions. The patient was advised to call back or seek an in-person evaluation if the symptoms worsen or if the condition fails to improve as anticipated.   Total time spent: 11 mins including non-face to face time and time spent for planning, charting and coordination of care  Rulon Eisenmenger, MD 05/01/2021    I, Thana Ates, am acting as scribe for Nicholas Lose, MD.  I have reviewed the above documentation for accuracy and completeness, and I agree with the above.

## 2021-05-01 ENCOUNTER — Ambulatory Visit (INDEPENDENT_AMBULATORY_CARE_PROVIDER_SITE_OTHER): Payer: 59 | Admitting: Psychiatry

## 2021-05-01 ENCOUNTER — Ambulatory Visit (HOSPITAL_BASED_OUTPATIENT_CLINIC_OR_DEPARTMENT_OTHER): Payer: 59 | Admitting: Hematology and Oncology

## 2021-05-01 ENCOUNTER — Other Ambulatory Visit: Payer: Self-pay

## 2021-05-01 DIAGNOSIS — F331 Major depressive disorder, recurrent, moderate: Secondary | ICD-10-CM | POA: Diagnosis not present

## 2021-05-01 DIAGNOSIS — D0511 Intraductal carcinoma in situ of right breast: Secondary | ICD-10-CM

## 2021-05-01 NOTE — Progress Notes (Signed)
[facility] Counselor/Therapist Progress Note  Patient ID: Sarah Hall, MRN: [id number],    Date: 05/01/2021  Time Spent: 58 minutes  Treatment Type: Individual Therapy  Reported Symptoms: anxiety, depression, agitated with her world right now re: pending divorce and frustrations, "some anger", "my focus is not so good", finds exercise and music helps some, getting out in yard helps  Mental Status Exam:  Appearance:   Casual     Behavior:  Appropriate, Sharing, and partially motivated  Motor:  Normal  Speech/Language:   Clear and Coherent  Affect:  Depressed and anxious  Mood:  anxious and depressed  Thought process:  goal directed  Thought content:    Obsessions  Sensory/Perceptual disturbances:    WNL  Orientation:  oriented to person, place, time/date, situation, day of week, month of year, year, and stated date of Nov. 10, 2022  Attention:  Good  Concentration:  Good and Fair  Memory:  WNL  Fund of knowledge:   Good  Insight:    Good  Judgment:   Good and Fair  Impulse Control:  Good and Fair   Risk Assessment: Danger to Self:  No Self-injurious Behavior: No Danger to Others: No Duty to Warn:no Physical Aggression / Violence:No  Access to Firearms a concern: No  Gang Involvement:No   Subjective: Patient and today reporting anxiety, depression, agitation, anger re: pending divorce.  Worked with her anger, depression, and agitation today as we talked through a lot of her thoughts and internal messages that can be negative and self-defeating. Resentful , angry, and expresses that clearly today as we continued to work with her resentment/anger  and some denial. Moving forward is very hard for patient and worked further on processing a lot of her thoughts/feelings/behaviors that get in the way of her moving forward including some anger and resentment.   Interventions: Solution-Oriented/Positive Psychology, Ego-Supportive, and Insight-Oriented  Diagnosis:    ICD-10-CM   1. Moderate recurrent major depression ([place])  F33.1      Treatment goal plan :  Patient not signing treatment plan on computer screen due to Covid. Treatment goals: Treatment goals remain on treatment plan as patient works with strategies to achieve her goals. Progress is assessed each session and noted  in progress notes. Long term goal: Develop healthy cognitive patterns and beliefs about self and the world that lead to alleviation and help prevent the relapse of depression symptoms. Short-term goal: Verbalize an understanding of the relationship between repressed anger and depressed mood.  Strategies: Use positive conflict resolution skills to resolve interpersonal discord and to make needs and expectations known.   Plan:  Patient today showing some motivation initially and did improve over the time of session. Focused on her depression, anxiety, resentment, anger, and frustration all related to her being in the midst of family conflict and pending divorce. Recognizing how difficult change is for her especially in letting go ad making better choices to focus on herself versus separated husband.  Some self-defeating behaviors discussed today and patient is to think more between sessions about "why I do this to myself?" and bring in her answers to next session to process further and hopefully help in her making some different choices that would support better emotional self-care.  Encouraged patient and her practice of positive behaviors that have been helpful to her including: Believe more in herself, working more to let go in order to move forward even with little steps, intentionally looking for more positives versus negatives  daily, staying in touch with supportive people practice consistent positive self talk, good nutrition and exercise, accentuating the positives within herself, staying on her prescribed medication, remaining in the present focusing on what she can control or  change versus cannot, practicing more intentional listening to others, reducing her overthinking and over analyzing, stop assuming worst case scenarios, saying no when she really needs to say no, interrupting anxious thoughts to challenge them and replace with more reality-based and empowering thoughts, work intentionally on letting go more the past, and feel good about the strength that she shows working with goal-directed behaviors to move in a direction that supports improved emotional health.  Goal review and progress/challenges noted with patient.  Next appointment within 2-3 weeks.  This record has been created using [employer].  Chart creation errors have been sought, but may not always have been located and corrected.  Such creation errors do not reflect on the standard of medical care provided.    Shanon Ace, LCSW

## 2021-05-01 NOTE — Assessment & Plan Note (Signed)
07/02/2020: Right lumpectomy: High-grade DCIS, margins clear, 2 right axillary lymph nodes negative, ER 5%, PR 0% Tis N0stage 0 Postoperative hematoma: Required evacuation.  Treatment plan: 1.Adjuvant radiation therapycompleted 09/25/2020 2.Adjuvant tamoxifen (patient took tamoxifen preoperatively because of the slight delay in surgery).    Tamoxifen toxicities: Joint stiffness in her wrists and elbows and knees.  They got better once she stopped tamoxifen 01/17/2021. Now resuming tamoxifen at 5 mg dosage 04/01/2021  Breast Cancer Surveillance: 1. Breast Exam: 04/01/2021: Benign 2. Mammogram:Annually in November 2022 I sent an order for mammogram to be done at the breast center.  She is very excited that her son got accepted to [facility] where he will spend 1 year in [place]. Return to clinic in 1 year for follow-up

## 2021-05-12 ENCOUNTER — Other Ambulatory Visit: Payer: Self-pay | Admitting: Family Medicine

## 2021-05-13 ENCOUNTER — Ambulatory Visit: Payer: 59 | Admitting: Hematology and Oncology

## 2021-05-13 ENCOUNTER — Encounter: Payer: Self-pay | Admitting: Gastroenterology

## 2021-05-14 ENCOUNTER — Other Ambulatory Visit: Payer: Self-pay

## 2021-05-14 ENCOUNTER — Ambulatory Visit (INDEPENDENT_AMBULATORY_CARE_PROVIDER_SITE_OTHER): Payer: 59 | Admitting: Psychiatry

## 2021-05-14 DIAGNOSIS — F331 Major depressive disorder, recurrent, moderate: Secondary | ICD-10-CM

## 2021-05-14 NOTE — Progress Notes (Signed)
[facility] Counselor/Therapist Progress Note  Patient ID: Sarah Hall, MRN: [id number],    Date: 05/14/2021  Time Spent: 55 minutes   Treatment Type: Individual Therapy  Reported Symptoms: anxiety, depression (improved some), some motivational challenges  Mental Status Exam:  Appearance:   Casual     Behavior:  Appropriate, Sharing, and Motivated  Motor:  Normal  Speech/Language:   Clear and Coherent  Affect:  Depressed and anxious  Mood:  anxious and depressed  Thought process:  goal directed  Thought content:    Some obsessiveness and rumination  Sensory/Perceptual disturbances:    WNL  Orientation:  oriented to person, place, time/date, situation, day of week, month of year, year, and stated date of Nov. 23, 2022  Attention:  Good  Concentration:  Good and Fair  Memory:  WNL  Fund of knowledge:   Good  Insight:    Good and Fair  Judgment:   Good  Impulse Control:  Good and Fair   Risk Assessment: Danger to Self:  No Self-injurious Behavior: No Danger to Others: No Duty to Warn:no Physical Aggression / Violence:No  Access to Firearms a concern: No  Gang Involvement:No   Subjective: Patient today reporting anxiety, depression (improved), sometime easily agitated, anger re: pending divorce, some avoidance of issues. Today is one-year anniversary of her breast cancer diagnosis. Reflected with patient on her treatment goal plan some of her progress and some of her challenges. Some repressed anger and feels it is not as strong as we processed some of her anger today. Trying to release/decrease internal messages of resentment/anger that can help her eventually move forward in healthier direction. States music and getting things done at home and being around other helps her mood.    Interventions: Solution-Oriented/Positive Psychology, Ego-Supportive, and Insight-Oriented  Treatment goal plan :  Patient not signing treatment plan on computer screen due to  Covid. Treatment goals: Treatment goals remain on treatment plan as patient works with strategies to achieve her goals. Progress is assessed each session and noted  in progress notes. Long term goal: Develop healthy cognitive patterns and beliefs about self and the world that lead to alleviation and help prevent the relapse of depression symptoms. Short-term goal: Verbalize an understanding of the relationship between repressed anger and depressed mood.  Strategies: Use positive conflict resolution skills to resolve interpersonal discord and to make needs and expectations known.    Diagnosis:   ICD-10-CM   1. Moderate recurrent major depression ([place])  F33.1      Plan:  Patient today showing some motivation that is "sometimes up and down" and did elevate some during her work in session today as she worked on her anxiety, depression, anger, resentment, and frustration related to pending divorce and all that's led up to it. Continuing her work to "let go of so much in her past in order to move forward. Recognizing some self-defeating behaviors and processed with patient. Encouraged her in her practice of positive thoughts and behaviors including: believing more in herself, to keep working on changes discussed in session even in little steps that helps her to move forward, intentionally looking for more positives versus negatives daily, remaining in the present focusing on what she can control or change versus cannot, staying in touch with supportive people, practice consistent positive self talk, good nutrition and exercise, accentuating the positives within herself, staying on her prescribed medication, practicing more intentional listening to others, reducing her overthinking and over analyzing, stop assuming worst  case scenarios, saying no when she needs to say no, interrupting anxious thoughts to challenge them and replace with more reality-based and empowering thoughts that do not feed her anxiety  and depression, work intentionally on letting go more of the past in order to move forward, and realize the strength she shows working with goal-directed behaviors to move in a direction that supports improved emotional health and overall wellbeing.  Goal review and progress/challenges noted with patient.  Next appt within 2 to 3 weeks.  This record has been created using [employer].  Chart creation errors have been sought, but may not always have been located and corrected.  Such creation errors do not reflect on the standard of medical care provided.    Shanon Ace, LCSW

## 2021-05-20 ENCOUNTER — Ambulatory Visit
Admission: RE | Admit: 2021-05-20 | Discharge: 2021-05-20 | Disposition: A | Discharge status: Home / Self Care | Payer: 59 | Source: Ambulatory Visit | Attending: Hematology and Oncology | Admitting: Hematology and Oncology

## 2021-05-20 DIAGNOSIS — D0511 Intraductal carcinoma in situ of right breast: Secondary | ICD-10-CM

## 2021-05-20 HISTORY — DX: Personal history of irradiation: Z92.3

## 2021-05-21 ENCOUNTER — Encounter: Payer: Self-pay | Admitting: Gastroenterology

## 2021-05-21 ENCOUNTER — Ambulatory Visit (AMBULATORY_SURGERY_CENTER): Payer: 59 | Admitting: Gastroenterology

## 2021-05-21 ENCOUNTER — Other Ambulatory Visit: Payer: Self-pay

## 2021-05-21 VITALS — BP 116/68 | HR 66 | Temp 99.3°F | Resp 13 | Ht 63.0 in | Wt 120.0 lb

## 2021-05-21 DIAGNOSIS — K5730 Diverticulosis of large intestine without perforation or abscess without bleeding: Secondary | ICD-10-CM

## 2021-05-21 DIAGNOSIS — D124 Benign neoplasm of descending colon: Secondary | ICD-10-CM

## 2021-05-21 DIAGNOSIS — R197 Diarrhea, unspecified: Secondary | ICD-10-CM | POA: Diagnosis present

## 2021-05-21 DIAGNOSIS — D123 Benign neoplasm of transverse colon: Secondary | ICD-10-CM | POA: Diagnosis not present

## 2021-05-21 DIAGNOSIS — R194 Change in bowel habit: Secondary | ICD-10-CM | POA: Diagnosis not present

## 2021-05-21 DIAGNOSIS — K5289 Other specified noninfective gastroenteritis and colitis: Secondary | ICD-10-CM

## 2021-05-21 DIAGNOSIS — K529 Noninfective gastroenteritis and colitis, unspecified: Secondary | ICD-10-CM | POA: Diagnosis not present

## 2021-05-21 DIAGNOSIS — K635 Polyp of colon: Secondary | ICD-10-CM | POA: Diagnosis not present

## 2021-05-21 MED ORDER — SODIUM CHLORIDE 0.9 % IV SOLN: 500.0000 mL | Freq: Once | INTRAVENOUS | Status: DC

## 2021-05-21 NOTE — Progress Notes (Signed)
VS completed by [place].   Medical and surgical history reviewed.

## 2021-05-21 NOTE — Patient Instructions (Signed)

## 2021-05-21 NOTE — Progress Notes (Signed)
Report to PACU, RN, vss, BBS= Clear.  

## 2021-05-21 NOTE — Op Note (Signed)
[facility] Patient Name: Loy Martinique Procedure Date: 05/21/2021 1:32 PM MRN: [id number] Endoscopist: Ladene Artist , MD Age: 60 Referring MD:  Date of Birth: 06/23/1960 Gender: Female Account #: [id number] Procedure:                Colonoscopy Indications:              Clinically significant diarrhea of unexplained                            origin, Change in bowel habits Medicines:                Monitored Anesthesia Care Procedure:                Pre-Anesthesia Assessment:                           - Prior to the procedure, a History and Physical                            was performed, and patient medications and                            allergies were reviewed. The patient's tolerance of                            previous anesthesia was also reviewed. The risks                            and benefits of the procedure and the sedation                            options and risks were discussed with the patient.                            All questions were answered, and informed consent                            was obtained. Prior Anticoagulants: The patient has                            taken no previous anticoagulant or antiplatelet                            agents. ASA Grade Assessment: II - A patient with                            mild systemic disease. After reviewing the risks                            and benefits, the patient was deemed in                            satisfactory condition to undergo the procedure.  After obtaining informed consent, the colonoscope                            was passed under direct vision. Throughout the                            procedure, the patient's blood pressure, pulse, and                            oxygen saturations were monitored continuously. The                            PCF-HQ190L Colonoscope was introduced through the                            anus and advanced to the the  terminal ileum, with                            identification of the appendiceal orifice and IC                            valve. The ileocecal valve, appendiceal orifice,                            and rectum were photographed. The quality of the                            bowel preparation was good. The colonoscopy was                            performed without difficulty. The patient tolerated                            the procedure well. Scope In: 1:38:13 PM Scope Out: 1:56:03 PM Scope Withdrawal Time: 0 hours 14 minutes 44 seconds  Total Procedure Duration: 0 hours 17 minutes 50 seconds  Findings:                 The perianal and digital rectal examinations were                            normal.                           The terminal ileum appeared normal.                           Two sessile polyps were found in the descending                            colon and transverse colon. The polyps were 6 to 7                            mm in size. These polyps were removed with a cold  snare. Resection and retrieval were complete.                           Multiple small-mouthed diverticula were found in                            the left colon. There was evidence of diverticular                            spasm. There was no evidence of diverticular                            bleeding.                           The exam was otherwise without abnormality on                            direct and retroflexion views. Random biopsies                            obtained throughout. Complications:            No immediate complications. Estimated blood loss:                            None. Estimated Blood Loss:     Estimated blood loss: none. Impression:               - The examined portion of the ileum was normal.                           - Two 6 to 7 mm polyps in the descending colon and                            in the transverse colon, removed with a  cold snare.                            Resected and retrieved.                           - Mild diverticulosis in the left colon.                           - The examination was otherwise normal on direct                            and retroflexion views. Biopsied. Recommendation:           - Repeat colonoscopy after studies are complete for                            surveillance based on pathology results.                           - Patient has a contact number available for  emergencies. The signs and symptoms of potential                            delayed complications were discussed with the                            patient. Return to normal activities tomorrow.                            Written discharge instructions were provided to the                            patient.                           - Resume previous diet.                           - Continue present medications.                           - Await pathology results. Ladene Artist, MD 05/21/2021 2:01:23 PM This report has been signed electronically.

## 2021-05-21 NOTE — Progress Notes (Signed)
History & Physical  Primary Care Physician:  Laurey Morale, MD Primary Gastroenterologist: Lucio Edward, MD  CHIEF COMPLAINT:   change a bowel habits, mild diarrhea  HPI: Sarah Hall is a 60 y.o. female with a change a bowel habits, mild diarrhea.     Past Medical History:  Diagnosis Date   ADHD (attention deficit hyperactivity disorder), inattentive type    Ankle sprain    right ankle   Breast cancer ([place])    Deep breathing    hard to take a deep breath due to thyroid issue   Depression    Bipolar depression, sees Metta Clines NP    Hypothyroidism    cared for by Dr. Ronita Hipps    Personal history of radiation therapy     Past Surgical History:  Procedure Laterality Date   BREAST BIOPSY Right 05/14/2020   BREAST CYST EXCISION Right 07/05/2020   Procedure: EVACUATION OF RIGHT BREAST HEMATOMA;  Surgeon: Donnie Mesa, MD;  Location: [facility];  Service: General;  Laterality: Right;  LMA   BREAST LUMPECTOMY Right 07/02/2020   BREAST LUMPECTOMY WITH RADIOACTIVE SEED AND SENTINEL LYMPH NODE BIOPSY Right 07/02/2020   Procedure: RIGHT BREAST LUMPECTOMY WITH RADIOACTIVE SEED AND RIGHT AXILLARY SENTINEL LYMPH NODE BIOPSY, BLUE DYE INJECTION;  Surgeon: Donnie Mesa, MD;  Location: [facility];  Service: General;  Laterality: Right;  PEC BLOCK, BLUE DYE INJECTION   COLONOSCOPY  09/03/2014   per Dr. Fuller Plan, benign polyps, repeat in 10 yrs    DILATION AND EVACUATION      2 times   scraping of uterus     20 years ago   [facility]      Prior to Admission medications   Medication Sig Start Date End Date Taking? Authorizing Provider  amphetamine-dextroamphetamine (ADDERALL) 20 MG tablet Take 1 tablet (20 mg total) by mouth 2 (two) times daily for 15 days. 03/07/21 05/21/21 Yes Laurey Morale, MD  Aspirin-Acetaminophen-Caffeine (GOODYS EXTRA STRENGTH PO) Take 1 packet by mouth daily as needed (pain).   Yes [provider]  azelastine (ASTELIN)  0.1 % nasal spray USE 2 SPRAYS IN EACH NOSTRIL TWICE DAILY AS DIRECTED 12/24/20  Yes Laurey Morale, MD  levothyroxine (SYNTHROID, LEVOTHROID) 88 MCG tablet Take 88 mcg by mouth daily before breakfast. 08/29/16  Yes Brien Few, MD  lisinopril (ZESTRIL) 10 MG tablet TAKE 1 TABLET(10 MG) BY MOUTH DAILY 05/12/21  Yes Laurey Morale, MD  magnesium 30 MG tablet Take 300 mg by mouth daily.    Yes [provider]  Multiple Vitamin (MULTIVITAMIN) tablet Take 1 tablet by mouth daily.   Yes [provider]  tamoxifen (NOLVADEX) 10 MG tablet Take 1 tablet (10 mg total) by mouth daily. 07/09/20  Yes Nicholas Lose, MD  temazepam (RESTORIL) 30 MG capsule TAKE 1 CAPSULE(30 MG) BY MOUTH AT BEDTIME AS NEEDED FOR SLEEP 04/09/21  Yes Laurey Morale, MD  ibuprofen (ADVIL) 200 MG tablet Take 200 mg by mouth every 8 (eight) hours as needed for mild pain.    [provider]    Current Outpatient Medications  Medication Sig Dispense Refill   amphetamine-dextroamphetamine (ADDERALL) 20 MG tablet Take 1 tablet (20 mg total) by mouth 2 (two) times daily for 15 days. 30 tablet 0   Aspirin-Acetaminophen-Caffeine (GOODYS EXTRA STRENGTH PO) Take 1 packet by mouth daily as needed (pain).     azelastine (ASTELIN) 0.1 % nasal spray USE 2 SPRAYS IN EACH NOSTRIL TWICE DAILY  AS DIRECTED 30 mL 3   levothyroxine (SYNTHROID, LEVOTHROID) 88 MCG tablet Take 88 mcg by mouth daily before breakfast.  1   lisinopril (ZESTRIL) 10 MG tablet TAKE 1 TABLET(10 MG) BY MOUTH DAILY 30 tablet 3   magnesium 30 MG tablet Take 300 mg by mouth daily.      Multiple Vitamin (MULTIVITAMIN) tablet Take 1 tablet by mouth daily.     tamoxifen (NOLVADEX) 10 MG tablet Take 1 tablet (10 mg total) by mouth daily. 90 tablet 3   temazepam (RESTORIL) 30 MG capsule TAKE 1 CAPSULE(30 MG) BY MOUTH AT BEDTIME AS NEEDED FOR SLEEP 30 capsule 5   ibuprofen (ADVIL) 200 MG tablet Take 200 mg by mouth every 8 (eight) hours as needed for mild pain.      Current Facility-Administered Medications  Medication Dose Route Frequency Provider Last Rate Last Admin   0.9 %  sodium chloride infusion  500 mL Intravenous Once Ladene Artist, MD        Allergies as of 05/21/2021 - Review Complete 05/21/2021  Allergen Reaction Noted   Codeine  05/28/2018   Codeine phosphate      Family History  Problem Relation Age of Onset   Alzheimer's disease Father    Breast cancer Paternal Grandmother    Colon cancer Maternal Uncle    Uterine cancer Maternal Grandmother    Esophageal cancer Neg Hx    Rectal cancer Neg Hx    Stomach cancer Neg Hx     Social History   Socioeconomic History   Marital status: Legally Separated    Spouse name: Not on file   Number of children: 1   Years of education: Not on file   Highest education level: Not on file  Occupational History   Not on file  Tobacco Use   Smoking status: Never   Smokeless tobacco: Never  Vaping Use   Vaping Use: Never used  Substance and Sexual Activity   Alcohol use: Yes    Alcohol/week: 10.0 standard drinks    Types: 10 Glasses of wine per week    Comment: social   Drug use: No   Sexual activity: Not on file  Other Topics Concern   Not on file  Social History Narrative   Not on file   Social Determinants of Health   Financial Resource Strain: Not on file  Food Insecurity: Not on file  Transportation Needs: Not on file  Physical Activity: Not on file  Stress: Not on file  Social Connections: Not on file  Intimate Partner Violence: Not At Risk   Fear of Current or Ex-Partner: No   Emotionally Abused: No   Physically Abused: No   Sexually Abused: No    Review of Systems:  All systems reviewed an negative except where noted in HPI.  Gen: Denies any fever, chills, sweats, anorexia, fatigue, weakness, malaise, weight loss, and sleep disorder CV: Denies chest pain, angina, palpitations, syncope, orthopnea, PND, peripheral edema, and claudication. Resp: Denies  dyspnea at rest, dyspnea with exercise, cough, sputum, wheezing, coughing up blood, and pleurisy. GI: Denies vomiting blood, jaundice, and fecal incontinence.   Denies dysphagia or odynophagia. GU : Denies urinary burning, blood in urine, urinary frequency, urinary hesitancy, nocturnal urination, and urinary incontinence. MS: Denies joint pain, limitation of movement, and swelling, stiffness, low back pain, extremity pain. Denies muscle weakness, cramps, atrophy.  Derm: Denies rash, itching, dry skin, hives, moles, warts, or unhealing ulcers.  Psych: Denies depression, anxiety, memory loss, suicidal  ideation, hallucinations, paranoia, and confusion. Heme: Denies bruising, bleeding, and enlarged lymph nodes. Neuro:  Denies any headaches, dizziness, paresthesias. Endo:  Denies any problems with DM, thyroid, adrenal function.   Physical Exam: General:  Alert, well-developed, in NAD Head:  Normocephalic and atraumatic. Eyes:  Sclera clear, no icterus.   Conjunctiva pink. Ears:  Normal auditory acuity. Mouth:  No deformity or lesions.  Neck:  Supple; no masses . Lungs:  Clear throughout to auscultation.   No wheezes, crackles, or rhonchi. No acute distress. Heart:  Regular rate and rhythm; no murmurs. Abdomen:  Soft, nondistended, nontender. No masses, hepatomegaly. No obvious masses.  Normal bowel .    Rectal:  Deferred   Msk:  Symmetrical without gross deformities.. Pulses:  Normal pulses noted. Extremities:  Without edema. Neurologic:  Alert and  oriented x4;  grossly normal neurologically. Skin:  Intact without significant lesions or rashes. Cervical Nodes:  No significant cervical adenopathy. Psych:  Alert and cooperative. Normal mood and affect.    Impression / Plan:   Change a bowel habits, mild diarrhea for colonoscopy.    Pricilla Riffle. Fuller Plan  05/21/2021, 1:35 PM See Shea Evans, [facility], to contact our on call provider

## 2021-05-21 NOTE — Progress Notes (Signed)
Called to room to assist during endoscopic procedure.  Patient ID and intended procedure confirmed with present staff. Received instructions for my participation in the procedure from the performing physician.  

## 2021-05-22 ENCOUNTER — Telehealth: Payer: Self-pay | Admitting: *Deleted

## 2021-05-22 NOTE — Telephone Encounter (Signed)
Message left

## 2021-05-22 NOTE — Telephone Encounter (Signed)
  Follow up Call-  Call back number 05/21/2021  Post procedure Call Back phone  # [phone]  Permission to leave phone message Yes  Some recent data might be hidden     Patient questions:  Do you have a fever, pain , or abdominal swelling? No. Pain Score  0 *  Have you tolerated food without any problems? Yes.    Have you been able to return to your normal activities? Yes.    Do you have any questions about your discharge instructions: Diet   No. Medications  No. Follow up visit  No.  Do you have questions or concerns about your Care? No.  Actions: * If pain score is 4 or above: No action needed, pain <4.

## 2021-05-26 ENCOUNTER — Ambulatory Visit (INDEPENDENT_AMBULATORY_CARE_PROVIDER_SITE_OTHER): Payer: 59 | Admitting: Psychiatry

## 2021-05-26 ENCOUNTER — Other Ambulatory Visit: Payer: Self-pay

## 2021-05-26 DIAGNOSIS — F331 Major depressive disorder, recurrent, moderate: Secondary | ICD-10-CM

## 2021-05-26 NOTE — Progress Notes (Signed)
[facility] Counselor/Therapist Progress Note  Patient ID: Sarah Hall, MRN: [id number],    Date: 05/26/2021  Time Spent: 55 minutes   Treatment Type: Individual Therapy  Reported Symptoms: "anxiety improving, depression improved, sadness, some better with motivation", working on acceptance  Mental Status Exam:  Appearance:   Well Groomed     Behavior:  Appropriate, Sharing, and Motivated  Motor:  Normal  Speech/Language:   Clear and Coherent  Affect:  anxious  Mood:  anxious and some depression  Thought process:  goal directed  Thought content:    Some overthinking  Sensory/Perceptual disturbances:    WNL  Orientation:  oriented to person, place, time/date, situation, day of week, month of year, year, and stated date of Dec. 5, 2022  Attention:  Fair  Concentration:  Fair  Memory:  Sometime my focus is not as good because of my current situation and some short term memory issues temporarily  Fund of knowledge:   Good  Insight:    Good  Judgment:   Good  Impulse Control:  Good   Risk Assessment: Danger to Self:  No Self-injurious Behavior: No Danger to Others: No Duty to Warn:no Physical Aggression / Violence:No  Access to Firearms a concern: No  Gang Involvement:No   Subjective: Patient in today reporting anxiety improving, depression improved, some sadness, motivation can fluctuate, and working on "acceptance".  Does find that she's less tolerant at times, "don't deal well with shallow people". Feels better at times and still struggles some at times.  Some improvement in her patience level at times and "I feel I should be better and I realize I wasted time on trying to create a sense of family and be the bigger person for adult kids."  Worked on self-forgiveness, anxiety, tolerance, repressed anger today as she continues to work through lots of different mixed feelings of hurt, anger, frustration, resentment and is making progress. Discussed some of her progress  with goal-directed behaviors. Friends invited her to beach for part of holiday weekend and that helped her feel supported. Anger not as strong. I realize that I need to deal better with certain triggers and discussed this today as well as how to interrupt the triggers and not go down the same emotional path as has happened in the past. Getting tasks done at home helps her feel more motivated and "up". Music and friends help also."Haven't had a pity party this past week so that is good."  Interventions: Ego-Supportive and Insight-Oriented   Treatment goal plan :  Patient not signing treatment plan on computer screen due to Covid. Treatment goals: Treatment goals remain on treatment plan as patient works with strategies to achieve her goals. Progress is assessed each session and noted  in progress notes. Long term goal: Develop healthy cognitive patterns and beliefs about self and the world that lead to alleviation and help prevent the relapse of depression symptoms. Short-term goal: Verbalize an understanding of the relationship between repressed anger and depressed mood.  Strategies: Use positive conflict resolution skills to resolve interpersonal discord and to make needs and expectations known.    Diagnosis:   ICD-10-CM   1. Moderate recurrent major depression ([place])  F33.1       Plan:  Patient today showing some increased strength, good motivation and active engagement in session today as she worked on her anxiety, frustration, "some ups and downs", depression, anger/resentment, and letting go of many things in her past (including most recent past) in  order to move forward. Still working through some pain from difficult marital situations and pending divorce. Improved self-awareness and self-care. Encouraged patient in her practice of positive thoughts and behaviors including:  Continue her improved self-care and healing in midst of painful marital separation and pending divorce. Believe  more in herself and her ability to heal. Intentionally look for more positives than negatives daily. Remain in the present focusing on what she can control and changes.  Stay in touch with supportive people. Practice consistent positive self-talk. Good nutrition and exercises. Accentuating the positives within herself. Staying on her prescribed meds. Stop assuming worst case scenarios. Practice more intentional listening to others.  Keep working on changes discussed in session, knowing that little steps help move her forward. Reduce overthinking and over analyzing. Saying no when she needs to say no. Interrupting anxious thoughts, challenge/replace with more realistic and empowering thoughts. Work intentionally on letting go more the past in order to move forward. Recognize the strengths she shows working with goal-directed behaviors to move in a direction that supports improved emotional health.   Goal review and progress/challenges noted with patient.  Next appointment within 2 weeks.  This record has been created using [employer].  Chart creation errors have been sought, but may not always have been located and corrected.  Such creation errors do not reflect on the standard of medical care provided.    Shanon Ace, LCSW

## 2021-06-09 ENCOUNTER — Ambulatory Visit (INDEPENDENT_AMBULATORY_CARE_PROVIDER_SITE_OTHER): Payer: 59 | Admitting: Psychiatry

## 2021-06-09 ENCOUNTER — Other Ambulatory Visit: Payer: Self-pay

## 2021-06-09 DIAGNOSIS — F331 Major depressive disorder, recurrent, moderate: Secondary | ICD-10-CM

## 2021-06-09 NOTE — Progress Notes (Signed)
[facility] Counselor/Therapist Progress Note  Patient ID: Sarah Hall, MRN: [id number],    Date: 06/09/2021  Time Spent: 58 minutes   Treatment Type: Individual Therapy  Reported Symptoms: anxiety, depression, "sometimes a roller coaster with my motivation and feelings" mostly regarding her pending divorce, "no highs but some optimism at times".  Mental Status Exam:  Appearance:   Neat     Behavior:  Appropriate, Sharing, and Motivated  Motor:  Normal  Speech/Language:   Clear and Coherent  Affect:  Depressed and anxious  Mood:  anxious and depressed  Thought process:  goal directed  Thought content:    Some obsessiveness  Sensory/Perceptual disturbances:    WNL  Orientation:  oriented to person, place, time/date, situation, day of week, month of year, year, and stated date of Dec. 19, 2022  Attention:  Good  Concentration:  Good  Memory:  WNL  Fund of knowledge:   Good  Insight:    Good and Fair  Judgment:   Good  Impulse Control:  Good and Fair   Risk Assessment: Danger to Self:  No Self-injurious Behavior: No Danger to Others: No Duty to Warn:no Physical Aggression / Violence:No  Access to Firearms a concern: No  Gang Involvement:No   Subjective:  Patient in today reporting anxiety and depression related to personal concerns and her pending divorce which patient needed to process more today, venting her frustrations, as well as modulating her emotions and trying to think and not be impulsive. Some moodiness noted more recently, sometimes dwelling on negatives/uncertainties/fears and feeling "stuck in a holding pattern." Worked on strategies and changing some thought patterns that may help patient in getting unstuck.  Relying on her faith more, which feels positive to her. Depression not quite as bad as it has been. Anxiety improving some, motivation fluctuates, anger not quite strong, and continues working acceptance. Easily impatient and annoyed at times but  trying to be more patient, self-forgiving and forgiving of others. Having some unexpected time with son while [place] the states. Feels she is beginning to be a "little more" hopeful and plans to check with separated husband about them not dragging the legal activity out a lot longer, and "possibly be able to agree and end the legal struggle sooner." Continued processing more of her anger, frustration, hurt, and resentment, which has helped patient in making some progress.  Interventions: Solution-Oriented/Positive Psychology, Ego-Supportive, and Insight-Oriented  Treatment goal plan :  Patient not signing treatment plan on computer screen due to Covid. Treatment goals: Treatment goals remain on treatment plan as patient works with strategies to achieve her goals. Progress is assessed each session and noted  in progress notes. Long term goal: Develop healthy cognitive patterns and beliefs about self and the world that lead to alleviation and help prevent the relapse of depression symptoms. Short-term goal: Verbalize an understanding of the relationship between repressed anger and depressed mood.  Strategies: Use positive conflict resolution skills to resolve interpersonal discord and to make needs and expectations known.    Diagnosis:   ICD-10-CM   1. Moderate recurrent major depression ([place])  F33.1       Plan: Patient today showing good motivation and active participation in session today. Seeing more positives although feels fragile somewhat to patient but "trying to trust more."  Showing increased trust and strength along with more confidence in herself.  Worked on some of her anxious/depressive thoughts, interrupting them and challenging in order to replace with more realistic and  empowering thoughts.  Still working through some pain from marital separation and pending divorce but feeling more confidence in other ways.  Encouraged patient and her practice of positive behaviors and thoughts  including:  Believe more in herself and her ability to make changes and heal. Continue her improved self-care and healing admits to painful marital separation/pending divorce. Intentionally look for more positives versus negatives daily. Remain in the present focusing on what she can control or change. Stay in touch with supportive people. Good nutrition and exercise. Practice consistent positive self talk. Accentuating the positives within herself. Stay on prescribed medications. Stop assuming worst case scenarios. Practice more intentional listening to others and "pay attention to how I say what I say". Keep working on changes discussed in session and paying attention even to the small steps. Reduce overthinking and over analyzing. Saying no when she needs to say no. Work intentionally on letting go more the past in order to move forward. Feel good about the strength she shows working with goal-directed behaviors to move in a direction that supports improved emotional health.   Goal review and progress/challenges noted with patient.  Next appointment within 2 weeks.  This record has been created using [employer].  Chart creation errors have been sought, but may not always have been located and corrected.  Such creation errors do not reflect on the standard of medical care provided.   Shanon Ace, LCSW

## 2021-06-10 ENCOUNTER — Ambulatory Visit (INDEPENDENT_AMBULATORY_CARE_PROVIDER_SITE_OTHER): Payer: 59 | Admitting: Family Medicine

## 2021-06-10 ENCOUNTER — Encounter: Payer: Self-pay | Admitting: Family Medicine

## 2021-06-10 VITALS — BP 120/82 | HR 87 | Temp 98.5°F | Wt 118.1 lb

## 2021-06-10 DIAGNOSIS — I7300 Raynaud's syndrome without gangrene: Secondary | ICD-10-CM | POA: Insufficient documentation

## 2021-06-10 DIAGNOSIS — R739 Hyperglycemia, unspecified: Secondary | ICD-10-CM

## 2021-06-10 DIAGNOSIS — K580 Irritable bowel syndrome with diarrhea: Secondary | ICD-10-CM

## 2021-06-10 DIAGNOSIS — K589 Irritable bowel syndrome without diarrhea: Secondary | ICD-10-CM | POA: Insufficient documentation

## 2021-06-10 NOTE — Progress Notes (Signed)
° °  Subjective:    Patient ID: Sarah Hall, female    DOB: [birth year], 60 y.o.   MRN: [id number]  HPI Here for several issues. First she has been seeing Dr. Fuller Plan and [place] for diarrhea over the past year. She had a colonoscopy on 05-21-21 thst showed hyperplastic polyps but no signs of IBD or microscopic colitis. She has adjusted her diet and the diarrhea has improved quite a bit. She now averages 2 loose stools a week, but there is no urgency and she has had no accidents. She uses Kaopectate as needed, usually twice a week. She asks if this is okay. Also over the past 3 months she has had a few episodes where 2 or 3 fingers on her right hand go numb and tingle, and the skin turns white. There is no pain. This lasts about 15 minutes and then goes away.    Review of Systems  Constitutional: Negative.   Respiratory: Negative.    Cardiovascular: Negative.   Gastrointestinal:  Positive for diarrhea. Negative for abdominal distention, abdominal pain, anal bleeding, blood in stool, constipation, nausea and vomiting.  Skin:  Positive for color change.      Objective:   Physical Exam Constitutional:      Appearance: Normal appearance.  Cardiovascular:     Rate and Rhythm: Normal rate and regular rhythm.     Pulses: Normal pulses.     Heart sounds: Normal heart sounds.  Pulmonary:     Effort: Pulmonary effort is normal.     Breath sounds: Normal breath sounds.  Abdominal:     General: Abdomen is flat. Bowel sounds are normal. There is no distension.     Palpations: Abdomen is soft. There is no mass.     Tenderness: There is no abdominal tenderness. There is no guarding or rebound.     Hernia: No hernia is present.  Skin:    General: Skin is warm and dry.     Coloration: Skin is not pale.     Findings: No erythema.  Neurological:     Mental Status: She is alert.          Assessment & Plan:  She has IBS with spells of diarrhea, partly managed by diet. I told her she can safely use  Kaopectate as needed. She also has Raynaud's syndrome. I explained that this is benign and that the best treatment as well as prevention for this is keeping her hands warm. She can follow up prn.  Alysia Penna, MD

## 2021-06-11 ENCOUNTER — Other Ambulatory Visit (INDEPENDENT_AMBULATORY_CARE_PROVIDER_SITE_OTHER): Payer: 59

## 2021-06-11 DIAGNOSIS — R739 Hyperglycemia, unspecified: Secondary | ICD-10-CM

## 2021-06-11 LAB — BASIC METABOLIC PANEL
BUN: 19 mg/dL (ref 6–23)
CO2: 30 meq/L (ref 19–32)
Calcium: 9.6 mg/dL (ref 8.4–10.5)
Chloride: 105 meq/L (ref 96–112)
Creatinine, Ser: 0.73 mg/dL (ref 0.40–1.20)
GFR: 90.62 mL/min (ref >60.00)
Glucose, Bld: 91 mg/dL (ref 70–99)
Potassium: 4.2 meq/L (ref 3.5–5.1)
Sodium: 141 meq/L (ref 135–145)

## 2021-06-11 LAB — CBC WITH DIFFERENTIAL/PLATELET
Basophils Absolute: 0 10*3/uL (ref 0.0–0.1)
Basophils Relative: 0.4 % (ref 0.0–3.0)
Eosinophils Absolute: 0 10*3/uL (ref 0.0–0.7)
Eosinophils Relative: 0.4 % (ref 0.0–5.0)
HCT: 39.9 % (ref 36.0–46.0)
Hemoglobin: 13.3 g/dL (ref 12.0–15.0)
Lymphocytes Relative: 39.5 % (ref 12.0–46.0)
Lymphs Abs: 2 10*3/uL (ref 0.7–4.0)
MCHC: 33.4 g/dL (ref 30.0–36.0)
MCV: 95.2 fL (ref 78.0–100.0)
Monocytes Absolute: 0.4 10*3/uL (ref 0.1–1.0)
Monocytes Relative: 8.7 % (ref 3.0–12.0)
Neutro Abs: 2.6 10*3/uL (ref 1.4–7.7)
Neutrophils Relative %: 51 % (ref 43.0–77.0)
Platelets: 173 10*3/uL (ref 150.0–400.0)
RBC: 4.19 Mil/uL (ref 3.87–5.11)
RDW: 12.5 % (ref 11.5–15.5)
WBC: 5 10*3/uL (ref 4.0–10.5)

## 2021-06-11 LAB — LIPID PANEL
Cholesterol: 178 mg/dL (ref 0–200)
HDL: 92.8 mg/dL (ref >39.00)
LDL Cholesterol: 73 mg/dL (ref 0–99)
NonHDL: 85.39
Total CHOL/HDL Ratio: 2
Triglycerides: 60 mg/dL (ref 0.0–149.0)
VLDL: 12 mg/dL (ref 0.0–40.0)

## 2021-06-11 LAB — HEPATIC FUNCTION PANEL
ALT: 11 U/L (ref 0–35)
AST: 19 U/L (ref 0–37)
Albumin: 4.5 g/dL (ref 3.5–5.2)
Alkaline Phosphatase: 50 U/L (ref 39–117)
Bilirubin, Direct: 0.1 mg/dL (ref 0.0–0.3)
Total Bilirubin: 0.6 mg/dL (ref 0.2–1.2)
Total Protein: 7 g/dL (ref 6.0–8.3)

## 2021-06-11 LAB — TSH: TSH: 0.91 u[IU]/mL (ref 0.35–5.50)

## 2021-06-11 LAB — HEMOGLOBIN A1C: Hgb A1c MFr Bld: 5.2 % (ref 4.6–6.5)

## 2021-06-16 ENCOUNTER — Encounter: Payer: Self-pay | Admitting: Gastroenterology

## 2021-06-18 ENCOUNTER — Telehealth: Payer: Self-pay | Admitting: Family Medicine

## 2021-06-18 NOTE — Telephone Encounter (Signed)
Patient called to see if there was a substitute she could have for her adderall while the shortage was going on. Patient states her pharmacy is out of it and she is going to run out of it as well.     Please send substitute to  [facility] [place], [address] Phone:  [phone]  Fax:  [phone]       Please advise

## 2021-06-18 NOTE — Telephone Encounter (Signed)
Pharmacy updated.

## 2021-06-19 MED ORDER — METHYLPHENIDATE HCL 20 MG PO TABS: 20.0000 mg | ORAL_TABLET | Freq: Two times a day (BID) | ORAL | 0 refills | Status: DC

## 2021-06-19 NOTE — Telephone Encounter (Signed)
She can try Ritalin for one month

## 2021-06-19 NOTE — Telephone Encounter (Signed)
Called patient to make aware that Ritalin has been sent to pharmacy in place of Adderall.

## 2021-06-28 ENCOUNTER — Other Ambulatory Visit: Payer: Self-pay | Admitting: Hematology and Oncology

## 2021-07-04 ENCOUNTER — Ambulatory Visit (INDEPENDENT_AMBULATORY_CARE_PROVIDER_SITE_OTHER): Payer: 59 | Admitting: Psychiatry

## 2021-07-04 ENCOUNTER — Other Ambulatory Visit: Payer: Self-pay

## 2021-07-04 DIAGNOSIS — F331 Major depressive disorder, recurrent, moderate: Secondary | ICD-10-CM

## 2021-07-04 NOTE — Progress Notes (Signed)
[facility] Counselor/Therapist Progress Note  Patient ID: Sarah Hall, MRN: [id number],    Date: 07/04/2021  Time Spent: 55 minutes   Treatment Type: Individual Therapy  Reported Symptoms: anxiety (improving), "emotional at times", depression "much improved", starting to feel more optimistic  Mental Status Exam:  Appearance:   Neat     Behavior:  Appropriate, Sharing, and Motivated  Motor:  Normal  Speech/Language:   Clear and Coherent  Affect:  anxious  Mood:  anxious  Thought process:  goal directed  Thought content:    Some overthinking and obsessivenss  Sensory/Perceptual disturbances:    WNL  Orientation:  oriented to person, place, time/date, situation, day of week, month of year, and year, and today's date of Jan. 13, 2023  Attention:  Good  Concentration:  Good and Fair  Memory:  WNL  Fund of knowledge:   Good  Insight:    Good and Fair  Judgment:   Good  Impulse Control:  Good and Fair   Risk Assessment: Danger to Self:  No Self-injurious Behavior: No Danger to Others: No Duty to Warn:no Physical Aggression / Violence:No  Access to Firearms a concern: No  Gang Involvement:No   Subjective: Patient in today reporting anxiety, "emotional at times", depression improving, and starting to feel more optimistic. States she is really noticing improvements and feeling some happiness and hope, and feeling gratitude for life and friends/family, "but not naive and I'm not necessarily out of the woods but have turned a corner in my work on getting healthier."  "Not letting setbacks affect me quite as badly as they used to ."Good visit with her son during holidays, which was helpful to patient. Much less impulsive. Working to not dwell on negatives/fears/ anxieties/uncertainties so as to stay out of the "stuck-ness" that she has been feeling earlier. Relies on her faith often.Depression significantly decreased and anxiety improving also. Strength of her anger is much  less. Less impatient. Trying to be more forgiving of others and herself. Hoping attorneys can work out a settlement in her and husband's divorce and avoid arbitration. Less resentment, and feeling progress is happening. Awareness of "what I still need to work on and what I need to change and feel I am working on that more consistently.   Interventions: Solution-Oriented/Positive Psychology, Ego-Supportive, and Insight-Oriented  Treatment goal plan :  Patient not signing treatment plan on computer screen due to Covid. Treatment goals: Treatment goals remain on treatment plan as patient works with strategies to achieve her goals. Progress is assessed each session and noted  in progress notes. Long term goal: Develop healthy cognitive patterns and beliefs about self and the world that lead to alleviation and help prevent the relapse of depression symptoms. Short-term goal: Verbalize an understanding of the relationship between repressed anger and depressed mood.  Strategies: Use positive conflict resolution skills to resolve interpersonal discord and to make needs and expectations known.   Diagnosis:   ICD-10-CM   1. Moderate recurrent major depression ([place])  F33.1      Plan: Patient today showing good motivation and participated well in session as she shared progress as well as what she is continuing to work on in efforts to keep moving forward. As noted above, patient showing more insight and feeling better in several ways, which is feeding more hopefulness for patient.  Goal-directed behaviors help keep patient focused and moving more in positive directions.  Decreased depression, stress, anger, resentment, second-guessing herself. Some anxiety, and is  improving. Sees more positives, not as easily hurt nor negatively impacted by others.Working to have more realistic and empowering thoughts.Encouraged patient in her practice of positive behaviors including:  Reflecting more on her progress more  often. Believing in herself and her ability to make changes and heal. Continue her improved self-care and healing . Intentionally look for more positives versus negatives daily. Remain in the present focuing on what she can control or change. Stay in touch with supportive people. Good nutrition and exercise. Practice consistent positive self-talk. Accentuating the positives within herself. Stay on prescribed medications. Stop assuming worst case scenarios. Practice more intentional listening to others and "pay attention to how I say what I say". Continue working on changes discussed in session, paying attention to the small steps. Reduce overthinking and over analyzing. Saying no when she needs to say no. Work intentionally on letting go more the past in order to move forward. Recognize the strengths she shows working with goal-directed behaviors to move in a direction that supports improved emotional health.  Goal review and progress/challenges noted with patient.  Next appointment within 2 to 3 weeks.  This record has been created using [employer].  Chart creation errors have been sought, but may not always have been located and corrected.  Such creation errors do not reflect on the standard of medical care provided.   Shanon Ace, LCSW

## 2021-07-14 ENCOUNTER — Ambulatory Visit: Payer: 59 | Admitting: Psychiatry

## 2021-07-14 ENCOUNTER — Ambulatory Visit: Payer: Managed Care, Other (non HMO) | Attending: Surgery

## 2021-07-14 ENCOUNTER — Other Ambulatory Visit: Payer: Self-pay

## 2021-07-14 VITALS — Wt 121.5 lb

## 2021-07-14 DIAGNOSIS — Z483 Aftercare following surgery for neoplasm: Secondary | ICD-10-CM | POA: Insufficient documentation

## 2021-07-14 NOTE — Therapy (Signed)
[facility] @ [address] [address] [place], [place], [address] Phone: [phone]   Fax:  [phone]  Physical Therapy Treatment  Patient Details  Name: Sarah Hall MRN: [id number] Date of Birth: [birth year] Referring Provider (PT): Dr. Donnie Mesa   Encounter Date: 07/14/2021   PT End of Session - 07/14/21 1533     Visit Number 2   # unchanged due to screen only   PT Start Time 7209    PT Stop Time 1535    PT Time Calculation (min) 4 min    Activity Tolerance Patient tolerated treatment well    Behavior During Therapy [facility] for tasks assessed/performed             Past Medical History:  Diagnosis Date   ADHD (attention deficit hyperactivity disorder), inattentive type    Ankle sprain    right ankle   Breast cancer ([place])    Deep breathing    hard to take a deep breath due to thyroid issue   Depression    Bipolar depression, sees Metta Clines NP    Hypothyroidism    cared for by Dr. Ronita Hipps    Personal history of radiation therapy     Past Surgical History:  Procedure Laterality Date   BREAST BIOPSY Right 05/14/2020   BREAST CYST EXCISION Right 07/05/2020   Procedure: EVACUATION OF RIGHT BREAST HEMATOMA;  Surgeon: Donnie Mesa, MD;  Location: [facility];  Service: General;  Laterality: Right;  LMA   BREAST LUMPECTOMY Right 07/02/2020   BREAST LUMPECTOMY WITH RADIOACTIVE SEED AND SENTINEL LYMPH NODE BIOPSY Right 07/02/2020   Procedure: RIGHT BREAST LUMPECTOMY WITH RADIOACTIVE SEED AND RIGHT AXILLARY SENTINEL LYMPH NODE BIOPSY, BLUE DYE INJECTION;  Surgeon: Donnie Mesa, MD;  Location: [facility];  Service: General;  Laterality: Right;  PEC BLOCK, BLUE DYE INJECTION   COLONOSCOPY  09/03/2014   per Dr. Fuller Plan, benign polyps, repeat in 10 yrs    DILATION AND EVACUATION      2 times   scraping of uterus     20 years ago   [place]:   07/14/21 1532  Weight: 121 lb 8 oz  (55.1 kg)     Subjective Assessment - 07/14/21 1532     Subjective Pt returns for her 3 month L-Dex screen.    Pertinent History Patient was diagnosed on 05/15/2020 with right DCIS. She underwent a right lumpectomy and sentinel node biopsy on 07/02/2020 with 2 negative nodes removed. It is ER positive and PR negative.                    L-DEX FLOWSHEETS - 07/14/21 1500       L-DEX LYMPHEDEMA SCREENING   Measurement Type Unilateral    L-DEX MEASUREMENT EXTREMITY Upper Extremity    POSITION  Standing    DOMINANT SIDE Right    At Risk Side Right    BASELINE SCORE (UNILATERAL) -1.2    L-DEX SCORE (UNILATERAL) -2.8    VALUE CHANGE (UNILAT) -1.6                                     PT Long Term Goals - 08/01/20 0950       PT LONG TERM GOAL #1   Title Patient will demonstrate she has regained full shoulder ROM and function post operatively compared  to baselines.    Time 8    Period Weeks    Status Achieved                   Plan - 07/14/21 1533     Clinical Impression Statement Pt returns for her 3 month L-Dex screen. Her change from baseline of -1.6 is WNLs so no further treatment is required at this time except to cont every 3 month L-Dex screens which pt is agreeable to.    PT Next Visit Plan Cont every 3 month L-Dex screens for up to 2 years from [facility] (~07/02/22)    Consulted and Agree with Plan of Care Patient             Patient will benefit from skilled therapeutic intervention in order to improve the following deficits and impairments:     Visit Diagnosis: Aftercare following surgery for neoplasm     Problem List Patient Active Problem List   Diagnosis Date Noted   IBS (irritable bowel syndrome) 06/10/2021   Raynaud's syndrome 06/10/2021   Primary hypertension 10/02/2020   Ductal carcinoma in situ (DCIS) of right breast 05/20/2020   Acquired hypothyroidism 06/09/2018   Seasonal allergies 06/09/2018    Depression, recurrent ([place]) 06/09/2018   Insomnia 01/15/2015   Low back pain 08/20/2014   Allergic rhinitis 02/22/2014   ADD (attention deficit disorder) 12/31/2011   Perimenopausal vasomotor symptoms 12/31/2011   Fibrocystic breast disease 04/14/2011    Otelia Limes, PTA 07/14/2021, 3:35 PM  [facility] @ [address] [place], [place], [address] Phone: [phone]   Fax:  [phone]  Name: Florette M Hall MRN: [id number] Date of Birth: [birth year]

## 2021-07-23 ENCOUNTER — Emergency Department (HOSPITAL_COMMUNITY): Payer: 59

## 2021-07-23 ENCOUNTER — Encounter (HOSPITAL_COMMUNITY): Payer: Self-pay

## 2021-07-23 ENCOUNTER — Emergency Department (HOSPITAL_COMMUNITY)
Admission: EM | Admit: 2021-07-23 | Discharge: 2021-07-23 | Disposition: A | Discharge status: Home / Self Care | Payer: 59 | Attending: Emergency Medicine | Admitting: Emergency Medicine

## 2021-07-23 ENCOUNTER — Other Ambulatory Visit: Payer: Self-pay

## 2021-07-23 DIAGNOSIS — Z79899 Other long term (current) drug therapy: Secondary | ICD-10-CM | POA: Diagnosis not present

## 2021-07-23 DIAGNOSIS — S01112A Laceration without foreign body of left eyelid and periocular area, initial encounter: Secondary | ICD-10-CM | POA: Insufficient documentation

## 2021-07-23 DIAGNOSIS — W19XXXA Unspecified fall, initial encounter: Secondary | ICD-10-CM | POA: Insufficient documentation

## 2021-07-23 DIAGNOSIS — R55 Syncope and collapse: Secondary | ICD-10-CM | POA: Diagnosis not present

## 2021-07-23 LAB — CBC
HCT: 37.8 % (ref 36.0–46.0)
Hemoglobin: 12.6 g/dL (ref 12.0–15.0)
MCH: 32.6 pg (ref 26.0–34.0)
MCHC: 33.3 g/dL (ref 30.0–36.0)
MCV: 97.7 fL (ref 80.0–100.0)
Platelets: 185 10*3/uL (ref 150–400)
RBC: 3.87 MIL/uL (ref 3.87–5.11)
RDW: 12.8 % (ref 11.5–15.5)
WBC: 6.4 10*3/uL (ref 4.0–10.5)
nRBC: 0 % (ref 0.0–0.2)

## 2021-07-23 LAB — BASIC METABOLIC PANEL
Anion gap: 7 (ref 5–15)
BUN: 20 mg/dL (ref 6–20)
CO2: 27 mmol/L (ref 22–32)
Calcium: 8.9 mg/dL (ref 8.9–10.3)
Chloride: 103 mmol/L (ref 98–111)
Creatinine, Ser: 0.61 mg/dL (ref 0.44–1.00)
GFR, Estimated: >60 mL/min (ref >60)
Glucose, Bld: 81 mg/dL (ref 70–99)
Potassium: 3.7 mmol/L (ref 3.5–5.1)
Sodium: 137 mmol/L (ref 135–145)

## 2021-07-23 LAB — CBG MONITORING, ED: Glucose-Capillary: 97 mg/dL (ref 70–99)

## 2021-07-23 LAB — TSH: TSH: 2.09 u[IU]/mL (ref 0.350–4.500)

## 2021-07-23 MED ORDER — LIDOCAINE-EPINEPHRINE 2 %-1:100000 IJ SOLN
20.0000 mL | Freq: Once | INTRAMUSCULAR | Status: AC
  Administered 2021-07-23: 20 mL via INTRADERMAL
  Filled 2021-07-23: qty 1

## 2021-07-23 NOTE — ED Provider Notes (Signed)
[facility] DEPT Provider Note   CSN: [id number] Arrival date & time: 07/23/21  0410     History  Chief Complaint  Patient presents with   Loss of Consciousness    Sarah Hall is a 61 y.o. female who presents emergency department for loss of consciousness.  Patient is currently under treatment for ductal carcinoma in situ.  She is on tamoxifen which makes her have hot flashes.  She took a [employer] p.m. tonight before bed.  She states that she had a hot flash and she got up to turn the [facility] on.  When she was walking back to the bathroom she lost consciousness and fell to the floor.  She states that she thinks she was only unconscious for a few minutes.  She hit her face and has a laceration to the left eyebrow and bruising to the nose.  She denies dental injury, malocclusion or pain with jaw movement.  She is up-to-date on her tetanus vaccination.  She does not take any blood thinners.  Patient denies chest pain or shortness of breath.  She states she has had syncopal event in the past which she got overheated   Loss of Consciousness     Home Medications Prior to Admission medications   Medication Sig Start Date End Date Taking? Authorizing Provider  amphetamine-dextroamphetamine (ADDERALL) 20 MG tablet Take 1 tablet (20 mg total) by mouth 2 (two) times daily for 15 days. 03/07/21 06/10/21  Laurey Morale, MD  Aspirin-Acetaminophen-Caffeine (GOODYS EXTRA STRENGTH PO) Take 1 packet by mouth daily as needed (pain).    [provider]  azelastine (ASTELIN) 0.1 % nasal spray USE 2 SPRAYS IN EACH NOSTRIL TWICE DAILY AS DIRECTED 12/24/20   Laurey Morale, MD  ibuprofen (ADVIL) 200 MG tablet Take 200 mg by mouth every 8 (eight) hours as needed for mild pain.    [provider]  levothyroxine (SYNTHROID, LEVOTHROID) 88 MCG tablet Take 88 mcg by mouth daily before breakfast. 08/29/16   Brien Few, MD  lisinopril (ZESTRIL) 10 MG tablet TAKE 1  TABLET(10 MG) BY MOUTH DAILY 05/12/21   Laurey Morale, MD  magnesium 30 MG tablet Take 300 mg by mouth daily.     [provider]  methylphenidate (RITALIN) 20 MG tablet Take 1 tablet (20 mg total) by mouth 2 (two) times daily. 06/19/21 07/19/21  Laurey Morale, MD  Multiple Vitamin (MULTIVITAMIN) tablet Take 1 tablet by mouth daily.    [provider]  tamoxifen (NOLVADEX) 10 MG tablet TAKE 1 TABLET(10 MG) BY MOUTH EVERY DAY 06/30/21   Nicholas Lose, MD  temazepam (RESTORIL) 30 MG capsule TAKE 1 CAPSULE(30 MG) BY MOUTH AT BEDTIME AS NEEDED FOR SLEEP 04/09/21   Laurey Morale, MD      Allergies    Codeine and Codeine phosphate    Review of Systems   Review of Systems  Cardiovascular:  Positive for syncope.   Physical Exam Updated Vital Signs BP 109/67    Pulse 83    Temp 97.6 F (36.4 C) (Oral)    Resp 18    Ht 5\' 3"  (1.6 m)    Wt 53.5 kg    LMP 03/10/2014    SpO2 99%    BMI 20.90 kg/m  Physical Exam Vitals and nursing note reviewed.  Constitutional:      General: She is not in acute distress.    Appearance: She is well-developed. She is not diaphoretic.  HENT:  Head: Normocephalic.     Comments: Laceration to the left eyebrow, bruising over the nasal bridge, bruising over the left zygomatic arch, no pain with eye movement Able to raise brow and close eye tightly. No numbness.    Right Ear: External ear normal.     Left Ear: External ear normal.     Nose: Nose normal.     Mouth/Throat:     Mouth: Mucous membranes are moist.  Eyes:     General: No scleral icterus.    Conjunctiva/sclera: Conjunctivae normal.  Cardiovascular:     Rate and Rhythm: Normal rate and regular rhythm.     Heart sounds: Normal heart sounds. No murmur heard.   No friction rub. No gallop.  Pulmonary:     Effort: Pulmonary effort is normal. No respiratory distress.     Breath sounds: Normal breath sounds.  Abdominal:     General: Bowel sounds are normal. There is no distension.      Palpations: Abdomen is soft. There is no mass.     Tenderness: There is no abdominal tenderness. There is no guarding.  Musculoskeletal:     Cervical back: Normal range of motion.  Skin:    General: Skin is warm and dry.  Neurological:     Mental Status: She is alert and oriented to person, place, and time.  Psychiatric:        Behavior: Behavior normal.    ED Results / Procedures / Treatments   Labs (all labs ordered are listed, but only abnormal results are displayed) Labs Reviewed  CBC  BASIC METABOLIC PANEL  TSH  CBG MONITORING, ED  I-STAT BETA HCG BLOOD, ED ([facility], [facility], [facility] ONLY)    EKG EKG Interpretation  Date/Time:  Wednesday July 23 2021 05:00:27 EST Ventricular Rate:  71 PR Interval:  139 QRS Duration: 106 QT Interval:  429 QTC Calculation: 467 R Axis:   39 Text Interpretation: Sinus rhythm RSR' in V1 or V2, right VCD or RVH Borderline T abnormalities, anterior leads Confirmed by Gerlene Fee [phone]) on 07/23/2021 6:29:23 AM  Radiology CT HEAD WO CONTRAST (5MM)  Result Date: 07/23/2021 CLINICAL DATA:  61 year old female status post fall. Left eye laceration and bruising. EXAM: CT HEAD WITHOUT CONTRAST TECHNIQUE: Contiguous axial images were obtained from the base of the skull through the vertex without intravenous contrast. RADIATION DOSE REDUCTION: This exam was performed according to the departmental dose-optimization program which includes automated exposure control, adjustment of the mA and/or kV according to patient size and/or use of iterative reconstruction technique. COMPARISON:  None. FINDINGS: Brain: Normal cerebral volume. No midline shift, ventriculomegaly, mass effect, evidence of mass lesion, intracranial hemorrhage or evidence of cortically based acute infarction. Gray-white matter differentiation is within normal limits throughout the brain. Vascular: Faint Calcified atherosclerosis at the skull base. No suspicious intracranial vascular hyperdensity. Skull:  No fracture identified.  Mild hyperostosis of the calvarium. Sinuses/Orbits: Visualized paranasal sinuses and mastoids are clear. Other: No convincing orbit or scalp soft tissue injury. See also face CT reported separately. IMPRESSION: 1. No acute traumatic injury identified. Normal noncontrast Head CT. 2. Face CT reported separately. Electronically Signed   By: Genevie Ann M.D.   On: 07/23/2021 06:20   CT Cervical Spine Wo Contrast  Result Date: 07/23/2021 CLINICAL DATA:  61 year old female status post fall. Left eye laceration and bruising. EXAM: CT CERVICAL SPINE WITHOUT CONTRAST TECHNIQUE: Multidetector CT imaging of the cervical spine was performed without intravenous contrast. Multiplanar CT image reconstructions were also generated.  RADIATION DOSE REDUCTION: This exam was performed according to the departmental dose-optimization program which includes automated exposure control, adjustment of the mA and/or kV according to patient size and/or use of iterative reconstruction technique. COMPARISON:  CT head and face today. Cervical spine radiographs 08/01/2006. FINDINGS: Alignment: Chronic straightening of cervical lordosis, mildly progressed since the 2008 radiographs. Cervicothoracic junction alignment is within normal limits. Bilateral posterior element alignment is within normal limits. Skull base and vertebrae: Visualized skull base is intact. No atlanto-occipital dissociation. C1 and C2 are intact and aligned. No acute osseous abnormality identified. Soft tissues and spinal canal: No prevertebral fluid or swelling. No visible canal hematoma. Negative noncontrast visible neck soft tissues. Disc levels: Lower cervical chronic disc and endplate degeneration. Mid and upper cervical facet hypertrophy mostly on the left. Up to mild associated spinal stenosis at C6-C7. Upper chest: Visible upper thoracic levels appear intact. Negative lung apices, noncontrast thoracic inlet. IMPRESSION: 1. No acute traumatic  injury identified in the cervical spine. 2. Cervical spine degeneration with up to mild spinal stenosis at C6-C7. Electronically Signed   By: Genevie Ann M.D.   On: 07/23/2021 06:28   CT Maxillofacial Wo Contrast  Result Date: 07/23/2021 CLINICAL DATA:  61 year old female status post fall. Left eye laceration and bruising. EXAM: CT MAXILLOFACIAL WITHOUT CONTRAST TECHNIQUE: Multidetector CT imaging of the maxillofacial structures was performed. Multiplanar CT image reconstructions were also generated. RADIATION DOSE REDUCTION: This exam was performed according to the departmental dose-optimization program which includes automated exposure control, adjustment of the mA and/or kV according to patient size and/or use of iterative reconstruction technique. COMPARISON:  Head and cervical spine CT today. FINDINGS: Osseous: Mandible intact and normally located. No acute dental finding. No maxilla, zygoma, pterygoid, or acute nasal bone fracture. Central skull base appears intact. Orbits: Intact orbital walls. Globes and intraorbital soft tissues appears symmetric and normal. Subtle asymmetric left periorbital superficial soft tissue swelling. No soft tissue gas. Sinuses: Clear throughout. Tympanic cavities and mastoids are clear. Soft tissues: Mild superficial left premalar space soft tissue contusion/swelling (series 4, image 41). Negative visible noncontrast thyroid, larynx, pharynx, parapharyngeal spaces, retropharyngeal space, sublingual space, submandibular spaces, masticator and parotid spaces. No upper cervical lymphadenopathy. Limited intracranial: Reported separately. IMPRESSION: 1. Mild superficial soft tissue injury about the left face and orbit. 2. No acute facial fracture or other acute injury identified. Electronically Signed   By: Genevie Ann M.D.   On: 07/23/2021 06:23    Procedures .Marland KitchenLaceration Repair  Date/Time: 07/23/2021 5:29 AM Performed by: Margarita Mail, PA-C Authorized by: Margarita Mail, PA-C    Consent:    Consent obtained:  Verbal   Consent given by:  Patient   Risks discussed:  Infection, need for additional repair, pain, poor cosmetic result and poor wound healing   Alternatives discussed:  No treatment and delayed treatment Universal protocol:    Procedure explained and questions answered to patient or proxy's satisfaction: yes     Relevant documents present and verified: yes     Test results available: yes     Imaging studies available: yes     Required blood products, implants, devices, and special equipment available: yes     Site/side marked: yes     Immediately prior to procedure, a time out was called: yes     Patient identity confirmed:  Verbally with patient Anesthesia:    Anesthesia method:  Local infiltration   Local anesthetic:  Lidocaine 1% WITH epi Laceration details:    Location:  Face  Face location:  L eyebrow   Length (cm):  4 Pre-procedure details:    Preparation:  Patient was prepped and draped in usual sterile fashion Exploration:    Wound exploration: wound explored through full range of motion and entire depth of wound visualized   Treatment:    Area cleansed with:  Povidone-iodine   Amount of cleaning:  Standard   Irrigation solution:  Sterile saline   Irrigation method:  Syringe Skin repair:    Repair method:  Sutures   Suture size:  4-0   Suture material:  Prolene   Suture technique:  Simple interrupted   Number of sutures:  3 Approximation:    Approximation:  Close Repair type:    Repair type:  Simple Post-procedure details:    Dressing:  Open (no dressing)   Procedure completion:  Tolerated well, no immediate complications    Medications Ordered in ED Medications  lidocaine-EPINEPHrine (XYLOCAINE W/EPI) 2 %-1:100000 (with pres) injection 20 mL (20 mLs Intradermal Given 07/23/21 0516)    ED Course/ Medical Decision Making/ A&P                           Medical Decision Making 61 year old female who presents emergency  department for syncope and eyebrow laceration The differential for syncope is extensive and includes, but is not limited to: arrythmia (Vtach, SVT, SSS, sinus arrest, AV block, bradycardia) aortic stenosis, AMI, HOCM, PE, atrial myxoma, pulmonary hypertension, orthostatic hypotension, (hypovolemia, drug effect, GB syndrome, micturition, cough, swall) carotid sinus sensitivity, Seizure, TIA/CVA, hypoglycemia,  Vertigo. Patient has had previous episodes of syncope when she is overheated and due to use of tamoxifen for her ductal carcinoma in situ was having a hot flash prior to her LOC. I considered potential for pulmonary embolus however patient has no leg swelling, chest pain, shortness of breath and has other valid reason for her loss of consciousness this evening.  I suspect patient was likely overheated, vasodilated and lost consciousness. Imaging reviewed and negative for acute fracture, or intracranial pathology.  Patient will be discharged with supportive care and return precautions.  Problems Addressed: Laceration of left eyebrow, initial encounter: acute illness or injury Syncope and collapse: acute illness or injury  Amount and/or Complexity of Data Reviewed Labs: ordered.    Details: I reviewed labs all of which are within normal limits. Radiology: ordered and independent interpretation performed.    Details: I ordered reviewed and interpreted images of the CT head, maxillofacial and CT's spine which showed no acute abnormalities ECG/medicine tests: ordered and independent interpretation performed.    Details: EKG shows sinus rhythm at a rate of 71.  She has negative orthostatic vital signs  Risk Prescription drug management.  } Final Clinical Impression(s) / ED Diagnoses Final diagnoses:  Syncope and collapse  Laceration of left eyebrow, initial encounter    Rx / DC Orders ED Discharge Orders     None         Margarita Mail, PA-C 07/23/21 7654    Maudie Flakes,  MD 07/23/21 [phone]

## 2021-07-23 NOTE — Discharge Instructions (Signed)
Contact a health care provider if: You have episodes of near fainting. Get help right away if: You faint. You hit your head or are injured after fainting. You have any of these symptoms that may indicate trouble with your heart: Fast or irregular heartbeats (palpitations). Unusual pain in your chest, abdomen, or back. Shortness of breath. You have a seizure. You have a severe headache. You are confused. You have vision problems. You have severe weakness or trouble walking. You are bleeding from your mouth or rectum, or you have black or tarry stool.  WOUND CARE Please have your stitches/staples removed in 7 days or sooner if you have concerns. You may do this at any available urgent care or at your primary care doctor's office.  Keep area clean and dry for 24 hours. Do not remove bandage, if applied.  After 24 hours, remove bandage and wash wound gently with mild soap and warm water. Reapply a new bandage after cleaning wound, if directed.  Continue daily cleansing with soap and water until stitches/staples are removed.  Do not apply any ointments or creams to the wound while stitches/staples are in place, as this may cause delayed healing.  Seek medical careif you experience any of the following signs of infection: Swelling, redness, pus drainage, streaking, fever >101.0 F  Seek care if you experience excessive bleeding that does not stop after 15-20 minutes of constant, firm pressure.

## 2021-07-23 NOTE — ED Triage Notes (Signed)
Patient arrives from home with report of fall. Pt states when she got out of bed tonight she passed out and hit her face on the bathroom floor. Patient has laceration to left eye and facial bruising.

## 2021-07-23 NOTE — ED Notes (Signed)
Pt to CT via stretcher by rad tech.

## 2021-07-23 NOTE — ED Notes (Signed)
Pt returned from CT scan.

## 2021-07-30 ENCOUNTER — Encounter: Payer: Self-pay | Admitting: Family Medicine

## 2021-07-30 ENCOUNTER — Ambulatory Visit (INDEPENDENT_AMBULATORY_CARE_PROVIDER_SITE_OTHER): Payer: 59 | Admitting: Family Medicine

## 2021-07-30 VITALS — BP 138/70 | HR 92 | Temp 98.5°F | Wt 121.0 lb

## 2021-07-30 DIAGNOSIS — S0181XD Laceration without foreign body of other part of head, subsequent encounter: Secondary | ICD-10-CM | POA: Diagnosis not present

## 2021-07-30 DIAGNOSIS — R55 Syncope and collapse: Secondary | ICD-10-CM | POA: Diagnosis not present

## 2021-07-30 NOTE — Progress Notes (Signed)
° °  Subjective:    Patient ID: Sarah Hall, female    DOB: Apr 27, 1961, 61 y.o.   MRN: [id number]  HPI Here to follow up an ED visit on 07-23-21 for syncope and to remove sutures. That night she had gotten up out of bed to use the bathroom, and she lost consciousness and fell to the tile floor. She thinks she woke up quickly and she noticed she was bleeding a lot from her face. At the ED she was found to have a deep laceration over the left eyebrow, and this was closed with sutures. Her labs and EKG were normal. CT scans of her head, cervical spine, and face were all negative. She was then able to go home. Since then she has felt fine with only mild soreness around the eyebrow. Her vision was never affected.    Review of Systems  Constitutional: Negative.   Respiratory: Negative.    Cardiovascular: Negative.   Skin:  Positive for wound.  Neurological:  Positive for syncope. Negative for dizziness and headaches.      Objective:   Physical Exam Constitutional:      General: She is not in acute distress.    Appearance: Normal appearance.  HENT:     Head:     Comments: There is a 3 cm linear healing laceration over the left eyebrow. There are 3 sutures in place. The wound is clean  Eyes:     Extraocular Movements: Extraocular movements intact.     Conjunctiva/sclera: Conjunctivae normal.     Pupils: Pupils are equal, round, and reactive to light.  Lymphadenopathy:     Cervical: No cervical adenopathy.  Neurological:     General: No focal deficit present.     Mental Status: She is alert. Mental status is at baseline.     Cranial Nerves: No cranial nerve deficit.     Gait: Gait normal.          Assessment & Plan:  She had a syncopal spell that likely was due to orthostasis. We discussed why this happened, and I advised her to get up slowly at night and to sit on the edge of the bed for 60 seconds before she stands up. The laceration is healing nicely, and all sutures were removed  today. Recheck as needed. We spent a total of (  32 ) minutes reviewing records and discussing these issues.  Alysia Penna, MD

## 2021-08-01 ENCOUNTER — Ambulatory Visit (INDEPENDENT_AMBULATORY_CARE_PROVIDER_SITE_OTHER): Payer: 59 | Admitting: Psychiatry

## 2021-08-01 ENCOUNTER — Other Ambulatory Visit: Payer: Self-pay

## 2021-08-01 DIAGNOSIS — F331 Major depressive disorder, recurrent, moderate: Secondary | ICD-10-CM

## 2021-08-01 NOTE — Progress Notes (Signed)
[facility] Counselor/Therapist Progress Note  Patient ID: Sarah Hall, MRN: [id number],    Date: 08/01/2021  Time Spent: 50 minutes   Treatment Type: Individual Therapy  Reported Symptoms: anxiety (improving), depression (improving)  Mental Status Exam:  Appearance:   Casual     Behavior:  Appropriate, Sharing, and Motivated  Motor:  Normal  Speech/Language:   Clear and Coherent  Affect:  Some anxiety, depression improved  Mood:  anxious and some depression  Thought process:  goal directed  Thought content:    overthinking  Sensory/Perceptual disturbances:    WNL  Orientation:  oriented to person, place, time/date, situation, day of week, month of year, year, and stated date of Feb. 10, 2023  Attention:  Good  Concentration:  Good  Memory:  WNL  Fund of knowledge:   Good  Insight:    Good  Judgment:   Good  Impulse Control:  Good and Fair   Risk Assessment: Danger to Self:  No Self-injurious Behavior: No Danger to Others: No Duty to Warn:no Physical Aggression / Violence:No  Access to Firearms a concern: No  Gang Involvement:No   Subjective: Patient in today reporting anxiety and depression are improving and I'm trying not to be as negative in my reactions. Working "to manage situations/stressor more effectively to not get over-stressed." Also trying not to let negatives control me as much. Continues steps in working on divorce proceedings and trying to negotiate with less conflict. Processed more recent stressors relating to upcoming divorce, which seemed helpful. Focusing on being more proactive. Hopeful they may be able to "complete the divorce within 1-2 months". Still feeling "more emotional" at times especially times when there's more stress. Feels husband being avoidant and immature about some things. Upset today and processed in session a lot of anxiety and frustration regarding her insurance related to an ER visit after a fall recently. Has been  gradually healing and trying not to get stuck in rehashing the difficult verbal interactions with separated husband prior to her fall. Overall, patient states she is noticing  some improvements in her level of hope and happiness, but still struggles with not holding onto anger, hurt, and frustration especially in situations she cannot control. States she is taking suggestion of using breathing exercises discussed in prior sessions and again today, and the focus on breathing she has found helpful in some situations. Encouraged her to work more consistently on the "letting go" in addition to the breathing techniques. Self awareness improving. Is starting to feel more that "the house is now my house" and actually invites friends by which she had not earlier felt like doing. Reports some increase in optimism and encouraged more of her not holding on to the negatives. "Lots still happening but think I'm rebounding more quickly."  Interventions: Solution-Oriented/Positive Psychology and Insight-Oriented  Treatment goal plan :  Patient not signing treatment plan on computer screen due to Covid. Treatment goals: Treatment goals remain on treatment plan as patient works with strategies to achieve her goals. Progress is assessed each session and noted  in progress notes. Long term goal: Develop healthy cognitive patterns and beliefs about self and the world that lead to alleviation and help prevent the relapse of depression symptoms. Short-term goal: Verbalize an understanding of the relationship between repressed anger and depressed mood.  Strategies: Use positive conflict resolution skills to resolve interpersonal discord and to make needs and expectations known.  Diagnosis:   ICD-10-CM   1. Moderate recurrent major  depression ([place])  F33.1      Plan: Patient today showing good motivation and engagement in session as she worked further on her symptoms of anxiety, and "feeling more emotional at times", as  she continues working through a challenging divorce process which is still ongoing at this point. Noted her progress and also continued need areas. "Not allowing myself to wallow in the anger and self-pity as much." Wrestling with the thoughts of  resentment and "it's not fair" and making progress and breeding some hopefulness for patient. Encouraged patient to allow goal-directed behaviors help support her staying focused and moving more in a positive direction of personal growth. Encouraged patient in her practice of positive behaviors that help support her including: Recognizing and reflecting more often on her progress. Accentuating the positives within herself. Believing in herself and her ability to make difficult changes and he will. Continue her improved self-care and healing. Intentionally look for more positives versus negatives daily. Remain in the present focusing on what she can control or change. Staying in touch with supportive people. Good nutrition and exercise. Practice consistent positive self talk. Identify the positives within herself and be able to name them. Stay on prescribed medications. Practice intentional listening to others and "paying attention to how I say what I say". Continue working on changes discussed in sessions, paying attention to the small steps of progress. Reduce overthinking and over analyzing. Saying no when she needs to say no. Intentionally work on letting go more of the past in order to move forward. Feel good about the strength she shows working with goal-directed behaviors to move in a direction that supports improved emotional health and wellbeing.  Goal review and progress/challenges noted with patient.  Next appointment within 2 to 3 weeks.  This record has been created using [employer].  Chart creation errors have been sought, but may not always have been located and corrected.  Such creation errors do not reflect on the standard of  medical care provided.  Shanon Ace, LCSW

## 2021-08-02 ENCOUNTER — Other Ambulatory Visit: Payer: Self-pay | Admitting: Family Medicine

## 2021-08-27 ENCOUNTER — Ambulatory Visit: Payer: 59 | Admitting: Psychiatry

## 2021-08-27 ENCOUNTER — Other Ambulatory Visit: Payer: Self-pay

## 2021-08-27 DIAGNOSIS — F331 Major depressive disorder, recurrent, moderate: Secondary | ICD-10-CM

## 2021-08-27 NOTE — Progress Notes (Signed)
?    [facility] Counselor/Therapist Progress Note ? ?Patient ID: Sarah Hall, MRN: [id number],   ? ?Date: 08/27/2021 ? ?Time Spent: 55 minutes  ? ?Treatment Type: Individual Therapy ? ?Reported Symptoms: anxious, "motivated mentally but hard to follow through", some depression but also some hopefulness ? ?Mental Status Exam: ? ?Appearance:   Neat     ?Behavior:  Appropriate, Sharing, and some motivation  ?Motor:  Normal  ?Speech/Language:   Clear and Coherent  ?Affect:  anxious  ?Mood:  anxious  ?Thought process:  goal directed  ?Thought content:    Some overthinking  ?Sensory/Perceptual disturbances:    WNL  ?Orientation:  oriented to person, place, time/date, situation, day of week, month of year, year, and stated date of August 27, 2021  ?Attention:  Good  ?Concentration:  Good and Fair  ?Memory:  WNL  ?Fund of knowledge:   Good  ?Insight:    Good  ?Judgment:   Good  ?Impulse Control:  Good and Fair  ? ?Risk Assessment: ?Danger to Self:  No ?Self-injurious Behavior: No ?Danger to Others: No ?Duty to Warn:no ?Physical Aggression / Violence:No  ?Access to Firearms a concern: No  ?Gang Involvement:No  ? ?Subjective:  Patient in today reporting anxiety and some limited depression. Recovering from her fall and hitting her head at her left eyebrow. Healing and actual wound is not visible today, but noticing some light-headedness, and "wanting to be more clear-headed", and some "difficulty processing things."Feels temperature changes and her allergies could be related to her symptoms she was concerned about. Son still in [place] til May, and then return home. Patient feeling their relationship has improved over past few month. Trying to better manage stressors and not let herself get over-stressed. "A lot has happened since our last appt."  "I submittled document to help settle my divorce sooner". Patient processed this further as she was very frustrated with separated husband. Talked through her concerns with  ongoing legal issues and difficulty dealing with separated husband. Trying "not to let negatives control me so much, and focusing on being more proactive." "More emotional at time especially with increased stress." Regular medical doctor prescribing a medication to help her with sleep and that is helping. On positive note, "I feel like the house really is my house now and is planning for changes and rearranges.Self-awareness improved.  Trying not to get stuck in any anger. Starting  to feel some happiness and hope for her future that has evolved more recently.  ? ?Interventions: Solution-Oriented/Positive Psychology, Ego-Supportive, and Insight-Oriented ? ?Treatment goal plan :  ?Patient not signing treatment plan on computer screen due to Covid. ?Treatment goals: ?Treatment goals remain on treatment plan as patient works with strategies to achieve her goals. Progress is assessed each session and noted  in progress notes. ?Long term goal: ?Develop healthy cognitive patterns and beliefs about self and the world that lead to alleviation and help prevent the relapse of depression symptoms. ?Short-term goal: ?Verbalize an understanding of the relationship between repressed anger and depressed mood.  ?Strategies: ?Use positive conflict resolution skills to resolve interpersonal discord and to make needs and expectations known. ? ?Diagnosis: ?  ICD-10-CM   ?1. Moderate recurrent major depression ([place])  F33.1   ?  ? ?Plan:  Patient today showing good motivation and participation in session as she focused more on her anxiety and some depression as she has been going through challenging divorce proceedings with her separated husband and sensing that the divorce should be able  to be finalized soon.  Processed a lot of her thoughts and feelings about this today but also the vision she is starting to have for her future and feeling more optimistic.  Trying to be healthier physically including good nutrition and exercise as well  as healthier emotionally as she processes feelings more readily and tries not to over-analyze nor hang onto the negative.  Continues to state that "I am not allowing myself to dwell on self-pity nor anger is much."  Does sometimes still wrestle with feelings that "it is not fair" and other feelings of resentment but again, tries not to stay attached to them.  Working with goal-directed behaviors to help support her in moving forward in a more positive direction of personal growth and staying focused on making good decisions. ?Encouraged patient in her practice of positive/affirming behaviors including: ?Believing in herself and her ability to make difficult changes. ?Recognizing and reflecting on her progress daily. ?Accentuating the positives within herself. ?Continuing her improved self-care and healing. ?Remain in the present focusing on what she can change or control. ?Intentionally look for more positives versus negatives daily. ?Staying in touch with supportive people. ?Good nutrition and exercise. ?Practice consistent positive self talk. ?Identify the positives within herself and be able to name them. ?Stay on prescribed medications. ?Practice intentional listening to others and paying attention to how I say what I say. ?Reduce overthinking and over analyzing. ?Saying no when I need to say no. ?Intentionally work on letting go more the past in order to move forward. ?Work on changes discussed in sessions, paying attention to the small steps of progress. ?Recognize the strength she shows working with goal-directed behaviors to move in a direction that supports her improved emotional health. ? ?Goal review and progress/challenges noted with patient. ? ?Next appointment within 3 weeks. ? ?This record has been created using [employer].  Chart creation errors have been sought, but may not always have been located and corrected.  Such creation errors do not reflect on the standard of medical care  provided. ? ? ?Shanon Ace, LCSW ? ? ? ? ? ? ? ? ? ? ? ? ? ? ? ? ? ? ?

## 2021-08-29 ENCOUNTER — Other Ambulatory Visit: Payer: Self-pay | Admitting: Family Medicine

## 2021-08-30 ENCOUNTER — Other Ambulatory Visit: Payer: Self-pay | Admitting: Family Medicine

## 2021-09-08 ENCOUNTER — Telehealth: Payer: Self-pay | Admitting: Family Medicine

## 2021-09-08 DIAGNOSIS — F900 Attention-deficit hyperactivity disorder, predominantly inattentive type: Secondary | ICD-10-CM

## 2021-09-08 MED ORDER — AMPHETAMINE-DEXTROAMPHETAMINE 20 MG PO TABS: 20.0000 mg | ORAL_TABLET | Freq: Two times a day (BID) | ORAL | 0 refills | Status: DC

## 2021-09-08 NOTE — Telephone Encounter (Signed)
Done for one month so she can try it  ?

## 2021-09-08 NOTE — Telephone Encounter (Signed)
Pharmacy updated.   Please advise 

## 2021-09-08 NOTE — Telephone Encounter (Signed)
Pt is calling and would like to go back on amphetamine-dextroamphetamine (ADDERALL) 20 MG tablet due to when she exerts herself she gets lightheadedness and per pt that is one of side effect of methylphenidate (RITALIN) 20 MG tablet pt is if issue does not stop she will make an appt  ?[facility] - [address], [address] [place] AT [place] Phone:  [phone]  ?Fax:  [phone]  ?  ? ?

## 2021-09-09 NOTE — Telephone Encounter (Signed)
Message complete on 09/08/2021 ?

## 2021-09-15 ENCOUNTER — Ambulatory Visit: Payer: 59 | Admitting: Psychiatry

## 2021-09-15 ENCOUNTER — Other Ambulatory Visit: Payer: Self-pay

## 2021-09-15 DIAGNOSIS — F331 Major depressive disorder, recurrent, moderate: Secondary | ICD-10-CM | POA: Diagnosis not present

## 2021-09-15 NOTE — Progress Notes (Signed)
?    [facility] Counselor/Therapist Progress Note ? ?Patient ID: Sarah Hall, MRN: [id number],   ? ?Date: 09/15/2021 ? ?Time Spent: 55 minutes  ? ?Treatment Type: Individual Therapy ? ?Reported Symptoms: anxiety, distracted/less focused, some sadness/slight depression ? ?Mental Status Exam: ? ?Appearance:   Casual     ?Behavior:  Appropriate, Sharing, and Motivated  ?Motor:  Normal  ?Speech/Language:   Clear and Coherent  ?Affect:  Depressed and anxiety  ?Mood:  anxious and depressed  ?Thought process:  goal directed  ?Thought content:    Some overthinking  ?Sensory/Perceptual disturbances:    WNL  ?Orientation:  oriented to person, place, time/date, situation, day of week, month of year, year, and stated date of September 15, 2021  ?Attention:  Good  ?Concentration:  Good and Fair  ?Memory:  WNL  ?Fund of knowledge:   Good  ?Insight:    Good and Fair  ?Judgment:   Good  ?Impulse Control:  Good and Fair  ? ?Risk Assessment: ?Danger to Self:  No ?Self-injurious Behavior: No ?Danger to Others: No ?Duty to Warn:no ?Physical Aggression / Violence:No  ?Access to Firearms a concern: No  ?Gang Involvement:No  ? ?Subjective: Patient in today reporting anxiety, some depression, some distractedness, and some sadness especially in "some things coming to an official end of marriage." States their court date is next month April 17th. Bounces emotionally between some mourning and also glad to start a new beginning. Not trying to run from her feelings "but instead staying with the feelings but not getting stuck in them." Getting out more socially and meeting people. Very aware of her mixed emotions as noted previously. Very ready for legal proceeding to "be over with." Continues working not to let any of the negatives control her and trying to remain proactive.  At times "I actually feel excited some about the future." Starting to go to bed a little earlier which is healthier for her. Making changes and plans for her future  that is more hopeful for patient. Less anger, more hope for the future. ?  ?Interventions: Solution-Oriented/Positive Psychology, Ego-Supportive, and Insight-Oriented ? ?Treatment goal plan :  ?Patient not signing treatment plan on computer screen due to Covid. ?Treatment goals: ?Treatment goals remain on treatment plan as patient works with strategies to achieve her goals. Progress is assessed each session and noted  in progress notes. ?Long term goal: ?Develop healthy cognitive patterns and beliefs about self and the world that lead to alleviation and help prevent the relapse of depression symptoms. ?Short-term goal: ?Verbalize an understanding of the relationship between repressed anger and depressed mood.  ?Strategies: ?Use positive conflict resolution skills to resolve interpersonal discord and to make needs and expectations known. ? ?Diagnosis: ?  ICD-10-CM   ?1. Moderate recurrent major depression ([place])  F33.1   ?  ? ? ?Plan:  Patient today showing good motivation and actively participated in session as she worked further on her anxiety, some depression, and some sadness regarding the mixed feelings she has about the ending of her marriage coming up soon, but also feels some positives about it as well and that it is the right thing going forward.  Processed more of her thoughts and feelings today and continues to have a positive vision of herself moving forward and feeling more optimistic, with some bittersweet feelings as well.  Has been focusing more on her health including nutrition and exercise in addition to the emotional health we are focusing on in her sessions.  Still  wrestling with the feelings of "things are not fair" and some resentment, however does not allow herself to get stuck in those, and continues to move forward with goal-directed behaviors aiming for more personal growth and moving in a positive direction.  Encouraged patient in her practice of more positive behaviors including: Believing  in herself and her ability to make difficult changes, recognizing and reflecting on her progress daily, accentuating the positives within herself, continuing her improved self-care and healing, remain in the present focusing on what she can control or change, intentionally look for more positives versus negatives daily, good nutrition and exercise, practice consistent positive self talk, staying in touch with supportive people, identify the positives within herself and be able to name them, remain on her prescribed medications, reduce overthinking and over analyzing, practice intentional listening to others and paying attention to how I say what I say, saying no when she needs to say no, intentionally working on letting go more of the past in order to move forward, continue to work on changes discussed in sessions, paying attention to the small steps of progress, and recognize the strength that she shows working with goal-directed behaviors to move in a direction that supports her improved emotional health and overall wellbeing. ? ?Goal review and progress/challenges noted with patient. ? ?Next appointment within 3 weeks. ? ?This record has been created using [employer].  Chart creation errors have been sought, but may not always have been located and corrected.  Such creation errors do not reflect on the standard of medical care provided. ? ? ?Shanon Ace, LCSW ? ? ? ? ? ? ? ? ? ? ? ? ? ? ? ? ? ? ?

## 2021-10-08 ENCOUNTER — Ambulatory Visit: Payer: 59 | Admitting: Psychiatry

## 2021-10-08 DIAGNOSIS — F331 Major depressive disorder, recurrent, moderate: Secondary | ICD-10-CM | POA: Diagnosis not present

## 2021-10-08 NOTE — Progress Notes (Signed)
?    [facility] Counselor/Therapist Progress Note ? ?Patient ID: Sarah Hall, MRN: [id number],   ? ?Date: 10/08/2021 ? ?Time Spent: 50 minutes  ? ?Treatment Type: Individual Therapy ? ?Reported Symptoms:  anxiety (improving), motivation (improving), energy better ? ?Mental Status Exam: ? ?Appearance:   Casual     ?Behavior:  Appropriate, Sharing, and Motivated  ?Motor:  Normal  ?Speech/Language:   Clear and Coherent  ?Affect:  anxious  ?Mood:  anxious  ?Thought process:  goal directed  ?Thought content:    Obsessions and overthinking  ?Sensory/Perceptual disturbances:    WNL  ?Orientation:  oriented to person, place, time/date, situation, day of week, month of year, year, and stated date of October 08, 2021  ?Attention:  Good  ?Concentration:  Good and Fair  ?Memory:  WNL  ?Fund of knowledge:   Good  ?Insight:    Good  ?Judgment:   Good  ?Impulse Control:  Good  ? ?Risk Assessment: ?Danger to Self:  No ?Self-injurious Behavior: No ?Danger to Others: No ?Duty to Warn:no ?Physical Aggression / Violence:No  ?Access to Firearms a concern: No  ?Gang Involvement:No  ? ?Subjective: Patient  today shares that her divorce was finalized yesterday and she feels she is managing as well as possible. Has had contact from her son and ex-husband and things that used to bother me are not bothering me as much. Depression much better. Some sadness re: marriage ending and having opportunities for new beginnings. More social and meeting new people. Mixed emotions "but I am working through it and notice my improvements. At times "I actually feel excited some about the future." Feeling more hopeful for her future and making changes and plans for her future that is more hopeful for patient. Less anger, more hope for her future. "Knows there will be some trying times moving forward.  ? ?Interventions: Solution-Oriented/Positive Psychology, Ego-Supportive, and Insight-Oriented ? ?Treatment goal plan :  ?Patient not signing treatment  plan on computer screen due to Covid. ?Treatment goals: ?Treatment goals remain on treatment plan as patient works with strategies to achieve her goals. Progress is assessed each session and noted  in progress notes. ?Long term goal: ?Develop healthy cognitive patterns and beliefs about self and the world that lead to alleviation and help prevent the relapse of depression symptoms. ?Short-term goal: ?Verbalize an understanding of the relationship between repressed anger and depressed mood.  ?Strategies: ?Use positive conflict resolution skills to resolve interpersonal discord and to make needs and expectations known. ? ?Diagnosis: ?  ICD-10-CM   ?1. Moderate recurrent major depression ([place])  F33.1   ?  ? ?Plan: Patient today actively participated and shared her thoughts/feelings after her divorce being finalized yesterday. Feeling of some sadness but more so of relief and glad to be moving forward. Showing some sad feelings but also showing some relief and actually looking forward  the future. Worked with her sad feelings but not stuck.  Looking at improved self-care especially during this time of feeling like she can find a new way in her life and also enjoy the fact she seems to have more of a relationship with her adult son at this point.  Less resentment and seemed sincere in wanting to move forward without anger and resentment and use goal-directed behaviors to help her get through this time of additional personal growth and move in a positive direction that is healthy for her. Encouraged patient to be practicing more positive behaviors including: Recognizing and reflecting on her progress  daily, believing in herself and her ability to make difficult changes, accentuating the positives within herself, continuing her improved self-care and healing, remain in the present focusing on what she can control or change, intentionally looking for more positives versus negatives daily, good nutrition and exercise,  practice consistent positive self talk, staying in touch with supportive people, identify the positives within herself and be able to name them, remain on her prescribed medications, reduce overthinking and over analyzing, practice intentional listening to others and paying attention to how I say what I say, saying no when she needs to say no, intentionally working on letting go more the past in order to move forward, continue to work on changes discussed in sessions, paying attention to the small steps of progress, and realize the strength she shows working with goal directed behaviors to move in a direction that supports her improved emotional health. ? ?Goal review and progress/challenges noted with patient. ? ?Next appointment within 2 to 3 weeks. ? ?This record has been created using [employer].  Chart creation errors have been sought, but may not always have been located and corrected.  Such creation errors do not reflect on the standard of medical care provided. ? ? ?Shanon Ace, LCSW ? ? ? ? ? ? ? ? ? ? ? ? ? ? ? ? ? ? ?

## 2021-10-09 ENCOUNTER — Encounter (HOSPITAL_COMMUNITY): Payer: Self-pay

## 2021-10-12 ENCOUNTER — Other Ambulatory Visit: Payer: Self-pay | Admitting: Family Medicine

## 2021-10-12 DIAGNOSIS — G4700 Insomnia, unspecified: Secondary | ICD-10-CM

## 2021-10-13 ENCOUNTER — Telehealth: Payer: Self-pay | Admitting: Family Medicine

## 2021-10-13 ENCOUNTER — Other Ambulatory Visit: Payer: Self-pay

## 2021-10-13 ENCOUNTER — Other Ambulatory Visit: Payer: Self-pay | Admitting: Family Medicine

## 2021-10-13 ENCOUNTER — Ambulatory Visit: Payer: 59 | Attending: Surgery

## 2021-10-13 VITALS — Wt 122.4 lb

## 2021-10-13 DIAGNOSIS — J309 Allergic rhinitis, unspecified: Secondary | ICD-10-CM

## 2021-10-13 DIAGNOSIS — Z483 Aftercare following surgery for neoplasm: Secondary | ICD-10-CM | POA: Insufficient documentation

## 2021-10-13 MED ORDER — LISINOPRIL 10 MG PO TABS: ORAL_TABLET | ORAL | 3 refills | Status: DC

## 2021-10-13 NOTE — Telephone Encounter (Signed)
Pt Rx sent to her pharmacy for refill ?

## 2021-10-13 NOTE — Telephone Encounter (Signed)
Pt LOV was on 07/30/2021 ?Last refill done on 04/09/2021 ?Please advise ?

## 2021-10-13 NOTE — Telephone Encounter (Signed)
Patient called to get refill on lisinopril (ZESTRIL) 10 MG tablet ? ?Patient is down to one pill  ? ? ?Please send to ? ?[facility] - [address], [address] AT [address] Phone:  [phone]  ?Fax:  [phone]  ?  ? ? ? ? ?Please advise  ? ? ?

## 2021-10-13 NOTE — Therapy (Signed)
?OUTPATIENT PHYSICAL THERAPY SOZO SCREENING NOTE ? ? ?Patient Name: Sarah Hall ?MRN: [id number] ?DOB:October 14, 1960, 61 y.o., female ?Today's Date: 10/13/2021 ? ?PCP: Laurey Morale, MD ?REFERRING PROVIDER: Donnie Mesa, MD ? ? PT End of Session - 10/13/21 1602   ? ? Visit Number 2   # unchanged due to screen only  ? PT Start Time 1600   ? PT Stop Time 0962   ? PT Time Calculation (min) 5 min   ? Activity Tolerance Patient tolerated treatment well   ? Behavior During Therapy [facility] for tasks assessed/performed   ? ?  ?  ? ?  ? ? ?Past Medical History:  ?Diagnosis Date  ? ADHD (attention deficit hyperactivity disorder), inattentive type   ? Ankle sprain   ? right ankle  ? Breast cancer ([place])   ? Deep breathing   ? hard to take a deep breath due to thyroid issue  ? Depression   ? Bipolar depression, sees Metta Clines NP   ? Hypothyroidism   ? cared for by Dr. Ronita Hipps   ? Personal history of radiation therapy   ? ?Past Surgical History:  ?Procedure Laterality Date  ? BREAST BIOPSY Right 05/14/2020  ? BREAST CYST EXCISION Right 07/05/2020  ? Procedure: EVACUATION OF RIGHT BREAST HEMATOMA;  Surgeon: Donnie Mesa, MD;  Location: [place];  Service: General;  Laterality: Right;  LMA  ? BREAST LUMPECTOMY Right 07/02/2020  ? BREAST LUMPECTOMY WITH RADIOACTIVE SEED AND SENTINEL LYMPH NODE BIOPSY Right 07/02/2020  ? Procedure: RIGHT BREAST LUMPECTOMY WITH RADIOACTIVE SEED AND RIGHT AXILLARY SENTINEL LYMPH NODE BIOPSY, BLUE DYE INJECTION;  Surgeon: Donnie Mesa, MD;  Location: [place];  Service: General;  Laterality: Right;  PEC BLOCK, BLUE DYE INJECTION  ? COLONOSCOPY  09/03/2014  ? per Dr. Fuller Plan, benign polyps, repeat in 10 yrs   ? DILATION AND EVACUATION    ?  2 times  ? scraping of uterus    ? 20 years ago  ? WISDOM TOOTH EXTRACTION    ? ?Patient Active Problem List  ? Diagnosis Date Noted  ? IBS (irritable bowel syndrome) 06/10/2021  ? Raynaud's syndrome 06/10/2021  ? Primary hypertension 10/02/2020  ?  Ductal carcinoma in situ (DCIS) of right breast 05/20/2020  ? Acquired hypothyroidism 06/09/2018  ? Seasonal allergies 06/09/2018  ? Depression, recurrent ([place]) 06/09/2018  ? Insomnia 01/15/2015  ? Low back pain 08/20/2014  ? Allergic rhinitis 02/22/2014  ? ADD (attention deficit disorder) 12/31/2011  ? Perimenopausal vasomotor symptoms 12/31/2011  ? Fibrocystic breast disease 04/14/2011  ? ? ?REFERRING DIAG: right breast cancer at risk for lymphedema ? ?THERAPY DIAG:  ?Aftercare following surgery for neoplasm ? ?PERTINENT HISTORY: Patient was diagnosed on 05/15/2020 with right DCIS. She underwent a right lumpectomy and sentinel node biopsy on 07/02/2020 with 2 negative nodes removed. It is ER positive and PR negative. ? ?PRECAUTIONS: right UE Lymphedema risk, None ? ?SUBJECTIVE: Pt returns for her 3 month L-Dex screen.  ? ?PAIN:  ?Are you having pain? No ? ?SOZO SCREENING: ?Patient was assessed today using the SOZO machine to determine the lymphedema index score. This was compared to her baseline score. It was determined that she is within the recommended range when compared to her baseline and no further action is needed at this time. She will continue SOZO screenings. These are done every 3 months for 2 years post operatively followed by every 6 months for 2 years, and then annually. ? ? ? ?Otelia Limes,  PTA ?10/13/2021, 4:05 PM ? ?  ? ?

## 2021-10-16 ENCOUNTER — Telehealth: Payer: Self-pay | Admitting: Family Medicine

## 2021-10-16 DIAGNOSIS — F900 Attention-deficit hyperactivity disorder, predominantly inattentive type: Secondary | ICD-10-CM

## 2021-10-16 NOTE — Telephone Encounter (Signed)
Pt requesting refill of amphetamine-dextroamphetamine (ADDERALL) 20 MG tablet (Expired)   ?[facility] [address], [address] AT [place] Phone:  [phone]  ?Fax:  [phone]  ?  ? ?

## 2021-10-17 MED ORDER — AMPHETAMINE-DEXTROAMPHETAMINE 20 MG PO TABS: 20.0000 mg | ORAL_TABLET | Freq: Two times a day (BID) | ORAL | 0 refills | Status: DC

## 2021-10-17 NOTE — Telephone Encounter (Signed)
LOV was 07/30/2021 ?Last refill was done on 09/08/2021 for 60 tablets ?Please advise ?

## 2021-10-17 NOTE — Telephone Encounter (Signed)
Done

## 2021-10-29 ENCOUNTER — Ambulatory Visit: Payer: 59 | Admitting: Psychiatry

## 2021-11-19 ENCOUNTER — Ambulatory Visit (INDEPENDENT_AMBULATORY_CARE_PROVIDER_SITE_OTHER): Payer: 59 | Admitting: Psychiatry

## 2021-11-19 DIAGNOSIS — F331 Major depressive disorder, recurrent, moderate: Secondary | ICD-10-CM
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
## 2021-12-17 ENCOUNTER — Ambulatory Visit (INDEPENDENT_AMBULATORY_CARE_PROVIDER_SITE_OTHER): Payer: 59 | Admitting: Psychiatry

## 2021-12-17 DIAGNOSIS — F331 Major depressive disorder, recurrent, moderate: Secondary | ICD-10-CM

## 2021-12-17 NOTE — Progress Notes (Deleted)
      [facility] Counselor/Therapist Progress Note  Patient ID: Sarah Hall, MRN: [id number],    Date: 12/17/2021  Time Spent: ***   Treatment Type: {CHL AMB THERAPY TYPES:[phone]}  Reported Symptoms: ***  Mental Status Exam:  Appearance:   {PSY:22683}     Behavior:  {PSY:21022743}  Motor:  {PSY:22302}  Speech/Language:   {PSY:22685}  Affect:  {PSY:22687}  Mood:  {PSY:31886}  Thought process:  {PSY:31888}  Thought content:    {[phone]}  Sensory/Perceptual disturbances:    {PSY:[phone]}  Orientation:  {PSY:30297}  Attention:  {PSY:22877}  Concentration:  {[phone]}  Memory:  {PSY:505-774-3457}  Fund of knowledge:   {[phone]}  Insight:    {[phone]}  Judgment:   {[phone]}  Impulse Control:  {[phone]}   Risk Assessment: Danger to Self:  {PSY:22692} Self-injurious Behavior: {PSY:22692} Danger to Others: {PSY:22692} Duty to Warn:{PSY:311194} Physical Aggression / Violence:{PSY:21197} Access to Firearms a concern: {PSY:21197} Gang Involvement:{PSY:21197}  Subjective: ***   Interventions: {PSY:[phone]}  Diagnosis:No diagnosis found.  Plan: ***  Shanon Ace, LCSW
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
## 2022-01-21 ENCOUNTER — Ambulatory Visit: Payer: Self-pay | Admitting: Psychiatry

## 2022-01-23 ENCOUNTER — Other Ambulatory Visit: Payer: Self-pay | Admitting: Family Medicine

## 2022-01-27 ENCOUNTER — Ambulatory Visit (INDEPENDENT_AMBULATORY_CARE_PROVIDER_SITE_OTHER): Payer: 59 | Admitting: Psychiatry

## 2022-01-27 DIAGNOSIS — F331 Major depressive disorder, recurrent, moderate: Secondary | ICD-10-CM | POA: Diagnosis not present

## 2022-01-27 NOTE — Progress Notes (Signed)
[facility] Counselor/Therapist Progress Note  Patient ID: Sarah Hall, MRN: [id number],    Date: 01/27/2022  Time Spent: 55 minutes   Treatment Type: Individual Therapy  Reported Symptoms: anxiety, stressed, overthinking, moody, emotional, reactive, some depression but "not as bad as it was a long time ago but more that a few weeks ago", overthinking and overanalyzing, irritable,No SI  Mental Status Exam:  Appearance:   Casual     Behavior:  Appropriate, Sharing, and Motivated  Motor:  Normal  Speech/Language:   Clear and Coherent  Affect:  Anxious, depressed  Mood:  anxious, depressed, and irritable  Thought process:  goal directed  Thought content:    overthinking  Sensory/Perceptual disturbances:    WNL  Orientation:  oriented to person, place, time/date, situation, day of week, month of year, year, and stated date of January 27, 2022  Attention:  Fair  Concentration:  Fair  Memory:  [place] of knowledge:   Good  Insight:    Good and Fair  Judgment:   Good  Impulse Control:  Good   Risk Assessment: Danger to Self:  No Self-injurious Behavior: No Danger to Others: No Duty to Warn:no Physical Aggression / Violence:No  Access to Firearms a concern: No  Gang Involvement:No   Subjective: Patient today in office reporting anxiety, depression, frustrated, irritable at times, overthinking, moody, emotional, and all the other symptoms listed above in "symptoms" section. But "also feel optimistic and times and some hopefulness." Trying "to not stay in my head so much and letting small things affect me."More difficulty in moving forward in some ways." Does state she is starting to look out for herself better. "Somewhat in a holding pattern"-self imposed "til I get more information". Difficulty trusting ex-husband. "May be over-reading situations." Anxiety noticeably increased and we focused more on this today. Vented well and processed her concerns and frustrations that  have been fueling her distrust.  Did seem to be more calm and grounded by session and but she also describes how certain contacts with ex-husband or certain things she hears, can trip her anxiety but is committed to working on better handling of these type situations, and as she stated "not staying in my head so much would help".  Just having a safe place to vent right now has been helpful for this patient.  She does have some challenges at times in getting good support and in sessions she knows that she will get support as well as constructive feedback that is goal directed and aimed at helping her move in a positive direction.  Interventions: Cognitive Behavioral Therapy and Ego-Supportive  Treatment goals: Treatment goals remain on treatment plan as patient works with strategies to achieve her goals. Progress is assessed each session and noted  in progress notes. Long term goal: Develop healthy cognitive patterns and beliefs about self and the world that lead to alleviation and help prevent the relapse of depression symptoms. Short-term goal: Verbalize an understanding of the relationship between repressed anger and depressed mood.  Strategies: Use positive conflict resolution skills to resolve interpersonal discord and to make needs and expectations known.  Diagnosis:   ICD-10-CM   1. Moderate recurrent major depression ([place])  F33.1      Plan:  Patient today showing active participation and good motivation in session working on her heightened anxiety along with her other symptoms listed above in "symptoms" section. Is noticeably more on edge today due to some continued stress and negative feelings  between herself and ex-husband. Trying not to be overwhelmed and be able to calm herself and not jump to conclusions, nor "let anyone control my thoughts". Struggling to "let go" more and be able to "let things go". Has sensed more "volatility" between sessions but reports feeling more calm and  grounded by session end today. To continue in between sessions and working on being able to ground herself when faced with more anxiety, and also reaching out to other people with whom she is close and being able to spend time together.  She is to return again within 1 to 2 weeks as we ran out of time today and covering some things she wanted to discuss.  Feeling more calm and denies any SI. Encouraged patient in her practice of positive behaviors including: Recognizing and reflecting on progress she has made, believe in herself and her abilities to make significant changes that can be lasting for her, accentuate the positives within herself, continuing her improved self-care and healing, staying the present focusing on what she can control or change, intentionally looking for more positives versus negatives, good nutrition and exercise, just consistent positive self talk, staying in touch with supportive people, listening to others and paying attention to "how I say what I say", saying no when she needs to say no, intentionally working on letting go more the past so as to move forward, continue working on changes within family relationships, paying attention to the small steps of progress, and recognizing the strength she shows when working with goal directed behaviors to move in a direction that supports her improved emotional health.  Goal review and progress/challenges noted with patient.  Next appointment within 3 weeks.  This record has been created using [employer].  Chart creation errors have been sought, but may not always have been located and corrected.  Such creation errors do not reflect on the standard of medical care provided.   Shanon Ace, LCSW

## 2022-02-02 ENCOUNTER — Ambulatory Visit: Payer: 59 | Attending: Surgery

## 2022-02-02 VITALS — Wt 120.0 lb

## 2022-02-02 DIAGNOSIS — Z483 Aftercare following surgery for neoplasm: Secondary | ICD-10-CM

## 2022-02-02 NOTE — Therapy (Signed)
OUTPATIENT PHYSICAL THERAPY SOZO SCREENING NOTE   Patient Name: Sarah Hall MRN: [id number] DOB:07-08-1960, 61 y.o., female Today's Date: 02/02/2022  PCP: Laurey Morale, MD REFERRING PROVIDER: Laurey Morale, MD   PT End of Session - 02/02/22 1629     Visit Number 2   # unchanged due to screen only   PT Start Time 1628    PT Stop Time 1632    PT Time Calculation (min) 4 min    Activity Tolerance Patient tolerated treatment well    Behavior During Therapy WFL for tasks assessed/performed             Past Medical History:  Diagnosis Date   ADHD (attention deficit hyperactivity disorder), inattentive type    Ankle sprain    right ankle   Breast cancer ([place])    Deep breathing    hard to take a deep breath due to thyroid issue   Depression    Bipolar depression, sees Metta Clines NP    Hypothyroidism    cared for by Dr. Ronita Hipps    Personal history of radiation therapy    Past Surgical History:  Procedure Laterality Date   BREAST BIOPSY Right 05/14/2020   BREAST CYST EXCISION Right 07/05/2020   Procedure: EVACUATION OF RIGHT BREAST HEMATOMA;  Surgeon: Donnie Mesa, MD;  Location: [place];  Service: General;  Laterality: Right;  LMA   BREAST LUMPECTOMY Right 07/02/2020   BREAST LUMPECTOMY WITH RADIOACTIVE SEED AND SENTINEL LYMPH NODE BIOPSY Right 07/02/2020   Procedure: RIGHT BREAST LUMPECTOMY WITH RADIOACTIVE SEED AND RIGHT AXILLARY SENTINEL LYMPH NODE BIOPSY, BLUE DYE INJECTION;  Surgeon: Donnie Mesa, MD;  Location: [place];  Service: General;  Laterality: Right;  PEC BLOCK, BLUE DYE INJECTION   COLONOSCOPY  09/03/2014   per Dr. Fuller Plan, benign polyps, repeat in 10 yrs    DILATION AND EVACUATION      2 times   scraping of uterus     20 years ago   [place]     Patient Active Problem List   Diagnosis Date Noted   IBS (irritable bowel syndrome) 06/10/2021   Raynaud's syndrome 06/10/2021   Primary hypertension 10/02/2020    Ductal carcinoma in situ (DCIS) of right breast 05/20/2020   Acquired hypothyroidism 06/09/2018   Seasonal allergies 06/09/2018   Depression, recurrent ([place]) 06/09/2018   Insomnia 01/15/2015   Low back pain 08/20/2014   Allergic rhinitis 02/22/2014   ADD (attention deficit disorder) 12/31/2011   Perimenopausal vasomotor symptoms 12/31/2011   Fibrocystic breast disease 04/14/2011    REFERRING DIAG: right breast cancer at risk for lymphedema  THERAPY DIAG: Aftercare following surgery for neoplasm  PERTINENT HISTORY: Patient was diagnosed on 05/15/2020 with right DCIS. She underwent a right lumpectomy and sentinel node biopsy on 07/02/2020 with 2 negative nodes removed. It is ER positive and PR negative.  PRECAUTIONS: right UE Lymphedema risk, None  SUBJECTIVE: Pt returns for her 3 month L-Dex screen.   PAIN:  Are you having pain? No  SOZO SCREENING: Patient was assessed today using the SOZO machine to determine the lymphedema index score. This was compared to her baseline score. It was determined that she is within the recommended range when compared to her baseline and no further action is needed at this time. She will continue SOZO screenings. These are done every 3 months for 2 years post operatively followed by every 6 months for 2 years, and then annually.   L-DEX FLOWSHEETS - 02/02/22  1600       L-DEX LYMPHEDEMA SCREENING   Measurement Type Unilateral    L-DEX MEASUREMENT EXTREMITY Upper Extremity    POSITION  Standing    DOMINANT SIDE Right    At Risk Side Right    BASELINE SCORE (UNILATERAL) -1.2    L-DEX SCORE (UNILATERAL) -2.6    VALUE CHANGE (UNILAT) -1.4              Otelia Limes, PTA 02/02/2022, 4:32 PM

## 2022-02-12 ENCOUNTER — Ambulatory Visit (INDEPENDENT_AMBULATORY_CARE_PROVIDER_SITE_OTHER): Payer: 59 | Admitting: Psychiatry

## 2022-02-12 DIAGNOSIS — F331 Major depressive disorder, recurrent, moderate: Secondary | ICD-10-CM | POA: Diagnosis not present

## 2022-02-12 NOTE — Progress Notes (Signed)
[facility] Counselor/Therapist Progress Note  Patient ID: Sarah Hall, MRN: [id number],    Date: 02/12/2022  Time Spent: 55 minutes   Treatment Type: Individual Therapy  Reported Symptoms: anxiety, "but more optimistic", problems focusing, depression decreased, fatigued more easily  Mental Status Exam:  Appearance:   Casual     Behavior:  Appropriate, Sharing, and Motivated  Motor:  Normal  Speech/Language:   Clear and Coherent  Affect:  anxious  Mood:  anxious  Thought process:  goal directed  Thought content:    overthinking  and overanalyzing  Sensory/Perceptual disturbances:    WNL  Orientation:  oriented to person, place, time/date, situation, day of week, month of year, year, and stated date of February 12, 2022  Attention:  Fair  Concentration:  Fair  Memory:  Occasional short term memory issues  Fund of knowledge:   Good  Insight:    Good  Judgment:   Good  Impulse Control:  Good   Risk Assessment: Danger to Self:  No Self-injurious Behavior: No Danger to Others: No Duty to Warn:no Physical Aggression / Violence:No  Access to Firearms a concern: No  Gang Involvement:No   Subjective: Patient today reporting depression decreased, anxiety heightened some, easily triggered, edgy "at times, problem focusing, and fatigues more easily. States she is in some ways feeling more optimistic "in general". "More emotional". Still working through some unresolved anger with ex-husband and is showing some progress very gradually with some ups and downs. Overthinking and overanalyzing and worked on this more in session today, along with some issues of manipulation per ex-husband and friend. Less "moody" she reports. Is more careful in her decision-making especially in meeting other people. Feeling more optimistic and having a sense of hopefulness. Confronting hurdles as she tries to move forward. Vented some frustration that tend to fuel her distrust in certain people. Feel  she is not staying "in my head as much." Accepting constructive feedback in positive manner and feels she is progressing and has more strength.  Interventions: Cognitive Behavioral Therapy and Ego-Supportive  Treatment goals: Treatment goals remain on treatment plan as patient works with strategies to achieve her goals. Progress is assessed each session and noted  in progress notes. Long term goal: Develop healthy cognitive patterns and beliefs about self and the world that lead to alleviation and help prevent the relapse of depression symptoms. Short-term goal: Verbalize an understanding of the relationship between repressed anger and depressed mood.  Strategies: Use positive conflict resolution skills to resolve interpersonal discord and to make needs and expectations known.   Diagnosis:   ICD-10-CM   1. Moderate recurrent major depression ([place])  F33.1      Plan: Patient showing good participation and motivation today in session. Showing progress in not being quite as reactionary when things don't go as she had hoped or planned for. Committed to her goal directed behaviors and following through more. Good problem-solving in sessions.  Encouraged to continue working on her issues between sessions and do some journaling as that would be helpful.  Definitely more calm and grounded by the end of session and remains committed to herself and goal directed behaviors supporting symptom reduction and moving forward. Encouraged patient in practicing more positive behaviors including: Reflecting on progress that she has made, believe more in herself and her abilities to make significant changes, accentuate the positives within herself and others, continuing her improved self-care and healing, remain in the present focusing on what she can change,  look for more positives versus negatives daily, good nutrition and exercise, consistent positive self talk, staying in touch with people who are supportive of her,  listening to others and paying attention to "how I say what I say", saying no when she needs to say no, intentionally working on letting go more the past so as to move forward, continue working on changes within family relationships, continue making wise decisions, and recognizing the strength she shows when working with goal directed behaviors to move in a direction that supports her improved emotional health and overall wellbeing.  Goal review and progress/challenges noted with patient.  Next appointment within 3 weeks.  This record has been created using [employer].  Chart creation errors have been sought, but may not always have been located and corrected.  Such creation errors do not reflect on the standard of medical care provided.   Shanon Ace, LCSW

## 2022-02-16 ENCOUNTER — Telehealth: Payer: Self-pay | Admitting: Family Medicine

## 2022-02-16 NOTE — Telephone Encounter (Signed)
Pt will need at least a virtual visit or she can wait until Dr Sarajane Jews returns next week.   Of note: medication was refilled 12/17/21 per med list; this RN unable to access controlled substance database.  If pt calls back, can schedule virtual visit with Dr Maudie Mercury on 02/17/22.

## 2022-02-16 NOTE — Telephone Encounter (Signed)
Pt called to request a refill of the  amphetamine-dextroamphetamine (ADDERALL) 20 MG tablet (Expired)  LOV:  07/30/2021  Will MD want her to come in first? MD will be out all week. Should Pt come in when he returns, or can she see another provider?  Please advise.  [id number] - [address], [address] AT [address] Phone:  [phone]  Fax:  [phone]

## 2022-02-24 ENCOUNTER — Telehealth (INDEPENDENT_AMBULATORY_CARE_PROVIDER_SITE_OTHER): Payer: 59 | Admitting: Family Medicine

## 2022-02-24 ENCOUNTER — Other Ambulatory Visit: Payer: Self-pay | Admitting: Family Medicine

## 2022-02-24 ENCOUNTER — Encounter: Payer: Self-pay | Admitting: Family Medicine

## 2022-02-24 DIAGNOSIS — J309 Allergic rhinitis, unspecified: Secondary | ICD-10-CM

## 2022-02-24 DIAGNOSIS — F900 Attention-deficit hyperactivity disorder, predominantly inattentive type: Secondary | ICD-10-CM | POA: Diagnosis not present

## 2022-02-24 DIAGNOSIS — G4700 Insomnia, unspecified: Secondary | ICD-10-CM

## 2022-02-24 MED ORDER — AMPHETAMINE-DEXTROAMPHETAMINE 20 MG PO TABS: 20.0000 mg | ORAL_TABLET | Freq: Two times a day (BID) | ORAL | 0 refills | Status: DC

## 2022-02-24 MED ORDER — TEMAZEPAM 30 MG PO CAPS: 30.0000 mg | ORAL_CAPSULE | Freq: Every day | ORAL | 1 refills | Status: DC

## 2022-02-24 NOTE — Progress Notes (Signed)
Subjective:    Patient ID: Sarah Hall, female    DOB: Dec 28, 1960, 61 y.o.   MRN: [id number]  HPI Here to follow up on ADHD. She has been taking Adderall 20 mg BID, and this has worked well for her. She has the flexibility to take 1/2 of a tablet at a time as needed.  Virtual Visit via Video Note  I connected with the patient on 02/24/22 at  2:45 PM EDT by a video enabled telemedicine application and verified that I am speaking with the correct person using two identifiers.  Location patient: home Location provider:work or home office Persons participating in the virtual visit: patient, provider  I discussed the limitations of evaluation and management by telemedicine and the availability of in person appointments. The patient expressed understanding and agreed to proceed.   HPI:    ROS: See pertinent positives and negatives per HPI.  Past Medical History:  Diagnosis Date   ADHD (attention deficit hyperactivity disorder), inattentive type    Ankle sprain    right ankle   Breast cancer ([place])    Deep breathing    hard to take a deep breath due to thyroid issue   Depression    Bipolar depression, sees Sarah Clines NP    Hypothyroidism    cared for by Dr. Ronita Hall    Personal history of radiation therapy     Past Surgical History:  Procedure Laterality Date   BREAST BIOPSY Right 05/14/2020   BREAST CYST EXCISION Right 07/05/2020   Procedure: EVACUATION OF RIGHT BREAST HEMATOMA;  Surgeon: Sarah Mesa, MD;  Location: [facility];  Service: General;  Laterality: Right;  LMA   BREAST LUMPECTOMY Right 07/02/2020   BREAST LUMPECTOMY WITH RADIOACTIVE SEED AND SENTINEL LYMPH NODE BIOPSY Right 07/02/2020   Procedure: RIGHT BREAST LUMPECTOMY WITH RADIOACTIVE SEED AND RIGHT AXILLARY SENTINEL LYMPH NODE BIOPSY, BLUE DYE INJECTION;  Surgeon: Sarah Mesa, MD;  Location: [facility];  Service: General;  Laterality: Right;  PEC BLOCK, BLUE DYE INJECTION   COLONOSCOPY   09/03/2014   per Dr. Fuller Hall, benign polyps, repeat in 10 yrs    Sarah Hall      2 times   scraping of uterus     20 years ago   WISDOM TOOTH EXTRACTION      Family History  Problem Relation Age of Onset   Alzheimer's disease Father    Breast cancer Paternal Grandmother    Colon cancer Maternal Uncle    Uterine cancer Maternal Grandmother    Esophageal cancer Neg Hx    Rectal cancer Neg Hx    Stomach cancer Neg Hx      Current Outpatient Medications:    Aspirin-Acetaminophen-Caffeine (GOODYS EXTRA STRENGTH PO), Take 1 packet by mouth daily as needed (pain)., Disp: , Rfl:    levothyroxine (SYNTHROID, LEVOTHROID) 88 MCG tablet, Take 88 mcg by mouth daily before breakfast., Disp: , Rfl: 1   lisinopril (ZESTRIL) 10 MG tablet, TAKE 1 TABLET(10 MG) BY MOUTH DAILY, Disp: 90 tablet, Rfl: 0   magnesium 30 MG tablet, Take 300 mg by mouth daily. , Disp: , Rfl:    Multiple Vitamin (MULTIVITAMIN) tablet, Take 1 tablet by mouth daily., Disp: , Rfl:    tamoxifen (NOLVADEX) 10 MG tablet, TAKE 1 TABLET(10 MG) BY MOUTH EVERY DAY, Disp: 90 tablet, Rfl: 3   temazepam (RESTORIL) 30 MG capsule, TAKE 1 CAPSULE(30 MG) BY MOUTH AT BEDTIME AS NEEDED FOR SLEEP, Disp: 30 capsule, Rfl: 5  amphetamine-dextroamphetamine (ADDERALL) 20 MG tablet, Take 1 tablet (20 mg total) by mouth 2 (two) times daily., Disp: 60 tablet, Rfl: 0   azelastine (ASTELIN) 0.1 % nasal spray, USE 2 SPRAYS IN EACH NOSTRIL TWICE DAILY AS DIRECTED, Disp: 30 mL, Rfl: 1  EXAM:  VITALS per patient if applicable:  GENERAL: alert, oriented, appears well and in no acute distress  HEENT: atraumatic, conjunttiva clear, no obvious abnormalities on inspection of external nose and ears  NECK: normal movements of the head and neck  LUNGS: on inspection no signs of respiratory distress, breathing rate appears normal, no obvious gross SOB, gasping or wheezing  CV: no obvious cyanosis  MS: moves all visible extremities without  noticeable abnormality  PSYCH/NEURO: pleasant and cooperative, no obvious depression or anxiety, speech and thought processing grossly intact  ASSESSMENT AND Hall: ADHD. We will refill the Adderall as above.  Sarah Penna, MD  Discussed the following assessment and Hall:  No diagnosis found.     I discussed the assessment and treatment Hall with the patient. The patient was provided an opportunity to ask questions and all were answered. The patient agreed with the Hall and demonstrated an understanding of the instructions.   The patient was advised to call back or seek an in-person evaluation if the symptoms worsen or if the condition fails to improve as anticipated.     Review of Systems     Objective:   Physical Exam        Assessment & Hall:

## 2022-02-25 ENCOUNTER — Ambulatory Visit: Payer: 59 | Admitting: Psychiatry

## 2022-03-18 ENCOUNTER — Ambulatory Visit (INDEPENDENT_AMBULATORY_CARE_PROVIDER_SITE_OTHER): Payer: 59 | Admitting: Psychiatry

## 2022-03-18 DIAGNOSIS — F411 Generalized anxiety disorder: Secondary | ICD-10-CM | POA: Diagnosis not present

## 2022-03-18 NOTE — Progress Notes (Signed)
[facility] Counselor/Therapist Progress Note  Patient ID: Sarah Hall, MRN: [id number],    Date: 03/18/2022  Time Spent: 55 minutes   Treatment Type: Individual Therapy  Reported Symptoms: depression (improving some), focus not so good due to my overthinking, overwhelmed, anxious, "but still some optimism", sometime over emotional at times, "motivated more mentally but the action part can be hard"  Mental Status Exam:  Appearance:   Casual     Behavior:  Appropriate, Sharing, and Motivated  Motor:  Normal  Speech/Language:   Clear and Coherent  Affect:  Depressed and anxious  Mood:  anxious and depressed  Thought process:  goal directed  Thought content:    overthinking  Sensory/Perceptual disturbances:    WNL  Orientation:  oriented to person, place, time/date, situation, day of week, month of year, year, and stated date of Sept. 27, 2023  Attention:  Good  Concentration:  Good and Fair  Memory:  WNL  Fund of knowledge:   Good  Insight:    Good and Fair  Judgment:   Good  Impulse Control:  Good and Fair   Risk Assessment: Danger to Self:  No Self-injurious Behavior: No Danger to Others: No Duty to Warn:no Physical Aggression / Violence:No  Access to Firearms a concern: No  Gang Involvement:No   Subjective:  Patient in today reporting increased anxiety and depression is decreasing some. Things are better at times and more difficult at time due to divorce issues and also some challenges her mom is having in moving into an assisted-living facility. Developed a couple more "friendships" and keeping health boundaries. Ran into ex-husband a couple times.  Easily triggered at times. Processed more of her feelings from divorce, and other questions re: ex-husband and her and husband's friend "J".  This talking through her thoughts/feelings seemed to be very helpful and calming for patient as she still struggles with some unresolved issues and is also making progress,  being more interactive, "but also been an emotional roller coaster at times." Self-care important, "I'm not just a survivor but also becoming more of a thriver." Showing more belief in herself and we discussed several examples of this today. Working hard to let go "more completely" of her former husband. Some challenges with focusing and fatigue and she is working on this and noticing improvement gradually. Unresolved anger decreasing, as she continues to work through her anger. Feels moodiness has decreased. Finds herself being more careful in meeting new people. Describes having hope but it can be challenging at times.  "Not staying in my head as much" and not getting stuck in negativity.   Interventions: Cognitive Behavioral Therapy and Ego-Supportive  Treatment goals: Treatment goals remain on treatment plan as patient works with strategies to achieve her goals. Progress is assessed each session and noted  in progress notes. Long term goal: Develop healthy cognitive patterns and beliefs about self and the world that lead to alleviation and help prevent the relapse of depression symptoms. Short-term goal: Verbalize an understanding of the relationship between repressed anger and depressed mood.  Strategies: Use positive conflict resolution skills to resolve interpersonal discord and to make needs and expectations known.  Diagnosis:   ICD-10-CM   1. Generalized anxiety disorder  F41.1      Plan:  Patient in today with good motivation and active participation in session focusing more on her anxiety, some depression which is decreasing, problem solving, not jumping to conclusions that might be an accurate, and remaining committed  to her goal directed behaviors with which she is doing a good job.  Showing more strength, at times more patient, more perseverance in a positive direction, and a healthier sense of self worth.  Is definitely making progress, and needs to continue her therapy at this point  to follow through more on goal directed behaviors that are helping her find her way again after having gone through a divorce and encountering some other changes and challenges within family. Encouraged patient in practicing more of the positive behaviors noted in session including: Reflecting on her progress that she has already made, believing more in herself and her abilities to make significant changes, accentuate the positives within herself and others, continue her improved self-care and healing, remain in the present focused on what she can change versus cannot, look for more positives versus negatives daily, good nutrition and exercise, consistent positive self talk, staying in touch with people who are supportive, listening to others and paying attention to "how I say what I say", saying no when she needs to say no, intentionally working on letting go more the past so as to move forward, continue working on changes within family relationships, emphasize wise decision making, and realize the strength she shows when working with goal directed behaviors to move in a direction that supports her improved emotional health.  Goal review and progress/challenges noted with patient.  Next appointment within 3 to 4 weeks. This record has been created using [employer].  Chart creation errors have been sought, but may not always have been located and corrected.  Such creation errors do not reflect on the standard of medical care provided.   Shanon Ace, LCSW

## 2022-04-15 ENCOUNTER — Ambulatory Visit: Payer: 59 | Admitting: Psychiatry

## 2022-04-15 DIAGNOSIS — F411 Generalized anxiety disorder: Secondary | ICD-10-CM | POA: Diagnosis not present

## 2022-04-15 NOTE — Progress Notes (Signed)
[facility] Counselor/Therapist Progress Note  Patient ID: Sarah Hall, MRN: [id number],    Date: 04/15/2022  Time Spent: 55 minutes   Treatment Type: Individual Therapy  Reported Symptoms: anxiety, stressed, anticipatory grief re: mom's declining health and hospice involved  Mental Status Exam:  Appearance:   Casual     Behavior:  Appropriate, Sharing, and Motivated  Motor:  Normal  Speech/Language:   Clear and Coherent  Affect:  anxious  Mood:  anxious  Thought process:  goal directed  Thought content:    WNL  Sensory/Perceptual disturbances:    WNL  Orientation:  oriented to person, place, time/date, situation, day of week, month of year, year, and stated date of Oct. 25, 2023  Attention:  Good  Concentration:  Good  Memory:  WNL  Fund of knowledge:   Good  Insight:    Good  Judgment:   Good  Impulse Control:  Good and Fair   Risk Assessment: Danger to Self:  No Self-injurious Behavior: No Danger to Others: No Duty to Warn:no Physical Aggression / Violence:No  Access to Firearms a concern: No  Gang Involvement:No   Subjective:  Patient today reporting anxiety and depression especially related to the physical decline of mom's health (COPD) and "hospice has been called for her." Patient to travel to see her next week. Uncertain if her "mom will live through the end of this year." Issues with divorced husband recently which she shared and processed together in session today. "I'm realizing  I'm a why, why, why kind of person and I seek to understand the why's in situations and over the past year for me in dealing with ex-husband and all I've been through", and needing part of session today to process this more with patient as she feels it is definitely related to her being able to move forward. "Very aware of my options at this point and trying to make good decisions." Trust issues have become important in other relationships as patient explains today "to protect  myself." Still in contacts with friends; keeping healthy boundaries. "Very aware of the losses of some extended family I used to have but now don't have contact with them."  "Feelings of sadness at times, loss of the love I used to have" and processed these more in session today which seemed to be helpful to patient in having more hope in moving forward. Reports "decreased number of times feeling like I'm on an emotional roller coaster at times."  Doing "emotional maintenance" including not overly pushing myself, letting myself off the hook at times and sit with my emotions some but not wallow. "  Emphasized positive self-care. Wanting to be a Chief Operating Officer, not just a survivor."  Better at times when she is alone now. More faith in herself and her choices. Seeing that "letting go of the past is a journey that takes some time and sees herself on that journey." Looking for more positives and stop holding onto the negatives. Trying to not get into thinking "woulda, coulda, shoulda" and catching herself to interrupt this as it happens as she works to "let go " more completely of prior marriage.  Interventions: Cognitive Behavioral Therapy and Ego-Supportive  Treatment goals: Treatment goals remain on treatment plan as patient works with strategies to achieve her goals. Progress is assessed each session and noted  in progress notes. Long term goal: Develop healthy cognitive patterns and beliefs about self and the world that lead to alleviation and help prevent  the relapse of depression symptoms. Short-term goal: Verbalize an understanding of the relationship between repressed anger and depressed mood.  Strategies: Use positive conflict resolution skills to resolve interpersonal discord and to make needs and expectations known.  Diagnosis:   ICD-10-CM   1. Generalized anxiety disorder  F41.1      Plan: Patient today showing active participation and motivation focusing on multiple stressors including: Her  mom's continued declining health with COPD and hospice has been called, continued issues as she still is healing from prior marital difficulties that ended in divorce this past year, and processing a lot of her thoughts both in her healing and and her being able to move forward.  Definitely showing progress as evidenced by the way she thinks through her concerns with less blaming, seeking understanding, currently making better decisions than she had been, and wanting to move forward in a much healthier direction.  Very aware of certain losses but wanting to be a "thriver and not just a survivor".  Showing increased insight and taking the time she needs to make important decisions.  Encouraged her to continue these along with other goal-directed behaviors as she continues to work on her anxiety, some repressed anger, and being able to make healthy decisions and move forward in a positive direction feeling confident in herself. Encouraged patient to practice positive behaviors as discussed in session including: Believing in herself and her abilities to continue making significant changes and adjustments as needed, reflect on her progress she has already made, accentuate the positives within herself and others, continue her improved self-care and healing, stay in the present focused on what she can change versus cannot, look for more positives versus negatives daily, good nutrition and exercise, consistent positive self talk, staying in touch with people who are supportive, listening to others and paying attention to "how I say what I say", intentionally working on letting go more of the past so as to move forward, saying no when she needs to say no, continue working on changes and rearranges within family relationships, emphasize wise decision making, and recognize the strengths she shows working with goal-directed behaviors to move in a direction that supports her improved emotional health and overall  outlook.  Goal review and progress/challenges noted with patient.  Next appt within 4 weeks.  This record has been created using [employer].  Chart creation errors have been sought, but may not always have been located and corrected.  Such creation errors do not reflect on the standard of medical care provided.    Shanon Ace, LCSW

## 2022-04-15 NOTE — Progress Notes (Deleted)
[facility] Counselor/Therapist Progress Note  Patient ID: Sarah Hall, MRN: [id number],    Date: 04/15/2022  Time Spent: 55 minutes   Treatment Type: Individual Therapy  Reported Symptoms: anxiety, stressed, anticipatory grief re: mom's declining health and hospice involved  Mental Status Exam:  Appearance:   Casual     Behavior:  Appropriate, Sharing, and Motivated  Motor:  Normal  Speech/Language:   Clear and Coherent  Affect:  anxious  Mood:  anxious  Thought process:  goal directed  Thought content:    WNL  Sensory/Perceptual disturbances:    WNL  Orientation:  oriented to person, place, time/date, situation, day of week, month of year, year, and stated date of Oct. 25, 2023  Attention:  Good  Concentration:  Good  Memory:  WNL  Fund of knowledge:   Good  Insight:    Good  Judgment:   Good  Impulse Control:  Good and Fair   Risk Assessment: Danger to Self:  No Self-injurious Behavior: No Danger to Others: No Duty to Warn:no Physical Aggression / Violence:No  Access to Firearms a concern: No  Gang Involvement:No   Subjective:  Patient today reporting anxiety and depression especially related to the physical decline of mom's health (COPD) and "hospice has been called for her." Patient to travel to see her next week. Uncertain if her "mom will live through the end of this year." Issues with divorced husband recently which she shared and processed together in session today. "I'm realizing  I'm a why, why, why kind of person and I seek to understand the why's in situations and over the past year for me in dealing with ex-husband and all I've been through", and needing part of session today to process this more with patient as she feels it is definitely related to her being able to move forward. "Very aware of my options at this point and trying to make good decisions." Trust issues have become important in other relationships as patient explains today "to protect  myself." Still in contacts with friends; keeping healthy boundaries. "Very aware of the losses of some extended family I used to have but now don't have contact with them."  "Feelings of sadness at times, loss of the love I used to have" and processed these more in session today which seemed to be helpful to patient in having more hope in moving forward. Reports "decreased number of times feeling like I'm on an emotional roller coaster at times."  Doing "emotional maintenance" including not overly pushing myself, letting myself off the hook at times and sit with my emotions some but not wallow. "  Emphasized positive self-care. Wanting to be a Chief Operating Officer, not just a survivor."  Better at times when she is alone now. More faith in herself and her choices. Seeing that "letting go of the past is a journey that takes some time and sees herself on that journey." Looking for more positives and stop holding onto the negatives. Trying to not get into thinking "woulda, coulda, shoulda" and catching herself to interrupt this as it happens as she works to "let go " more completely of prior marriage.  Interventions: Cognitive Behavioral Therapy and Ego-Supportive  Treatment goals: Treatment goals remain on treatment plan as patient works with strategies to achieve her goals. Progress is assessed each session and noted  in progress notes. Long term goal: Develop healthy cognitive patterns and beliefs about self and the world that lead to alleviation and help prevent  the relapse of depression symptoms. Short-term goal: Verbalize an understanding of the relationship between repressed anger and depressed mood.  Strategies: Use positive conflict resolution skills to resolve interpersonal discord and to make needs and expectations known.  Diagnosis:   ICD-10-CM   1. Generalized anxiety disorder  F41.1      Plan: Patient today showing active participation and motivation focusing on multiple stressors including: Her  mom's continued declining health with COPD and hospice has been called, continued issues as she still is healing from prior marital difficulties that ended in divorce this past year, and processing a lot of her thoughts both in her healing and and her being able to move forward.  Definitely showing progress as evidenced by the way she thinks through her concerns with less blaming, seeking understanding, currently making better decisions than she had been, and wanting to move forward in a much healthier direction.  Very aware of certain losses but wanting to be a "thriver and not just a survivor".  Showing increased insight and taking the time she needs to make important decisions.  Encouraged her to continue these along with other goal-directed behaviors as she continues to work on her anxiety, some repressed anger, and being able to make healthy decisions and move forward in a positive direction feeling confident in herself. Encouraged patient to practice positive behaviors as discussed in session including: Believing in herself and her abilities to continue making significant changes and adjustments as needed, reflect on her progress she has already made, accentuate the positives within herself and others, continue her improved self-care and healing, stay in the present focused on what she can change versus cannot, look for more positives versus negatives daily, good nutrition and exercise, consistent positive self talk, staying in touch with people who are supportive, listening to others and paying attention to "how I say what I say", intentionally working on letting go more of the past so as to move forward, saying no when she needs to say no, continue working on changes and rearranges within family relationships, emphasize wise decision making, and recognize the strengths she shows working with goal-directed behaviors to move in a direction that supports her improved emotional health and overall  outlook.  Goal review and progress/challenges noted with patient.  Next appt within 4 weeks.  This record has been created using [employer].  Chart creation errors have been sought, but may not always have been located and corrected.  Such creation errors do not reflect on the standard of medical care provided.    Shanon Ace, LCSW

## 2022-04-27 ENCOUNTER — Other Ambulatory Visit: Payer: Self-pay | Admitting: Family Medicine

## 2022-04-27 DIAGNOSIS — G4700 Insomnia, unspecified: Secondary | ICD-10-CM

## 2022-05-01 ENCOUNTER — Other Ambulatory Visit: Payer: Self-pay | Admitting: Family Medicine

## 2022-05-04 ENCOUNTER — Ambulatory Visit: Payer: 59 | Attending: Surgery

## 2022-05-04 VITALS — Wt 122.2 lb

## 2022-05-04 DIAGNOSIS — Z483 Aftercare following surgery for neoplasm: Secondary | ICD-10-CM | POA: Insufficient documentation

## 2022-05-04 NOTE — Therapy (Signed)
OUTPATIENT PHYSICAL THERAPY [facility] SCREENING NOTE   Patient Name: Sarah Hall MRN: [id number] DOB:1961-02-21, 61 y.o., female Today's Date: 05/04/2022  PCP: Laurey Morale, MD REFERRING PROVIDER: Donnie Mesa, MD   PT End of Session - 05/04/22 1625     Visit Number 2   # unchanged due to screen only   PT Start Time 41    PT Stop Time 1626    PT Time Calculation (min) 3 min    Activity Tolerance Patient tolerated treatment well    Behavior During Therapy WFL for tasks assessed/performed             Past Medical History:  Diagnosis Date   ADHD (attention deficit hyperactivity disorder), inattentive type    Ankle sprain    right ankle   Breast cancer ([place])    Deep breathing    hard to take a deep breath due to thyroid issue   Depression    Bipolar depression, sees Metta Clines NP    Hypothyroidism    cared for by Dr. Ronita Hipps    Personal history of radiation therapy    Past Surgical History:  Procedure Laterality Date   BREAST BIOPSY Right 05/14/2020   BREAST CYST EXCISION Right 07/05/2020   Procedure: EVACUATION OF RIGHT BREAST HEMATOMA;  Surgeon: Donnie Mesa, MD;  Location: [facility];  Service: General;  Laterality: Right;  LMA   BREAST LUMPECTOMY Right 07/02/2020   BREAST LUMPECTOMY WITH RADIOACTIVE SEED AND SENTINEL LYMPH NODE BIOPSY Right 07/02/2020   Procedure: RIGHT BREAST LUMPECTOMY WITH RADIOACTIVE SEED AND RIGHT AXILLARY SENTINEL LYMPH NODE BIOPSY, BLUE DYE INJECTION;  Surgeon: Donnie Mesa, MD;  Location: [facility];  Service: General;  Laterality: Right;  PEC BLOCK, BLUE DYE INJECTION   COLONOSCOPY  09/03/2014   per Dr. Fuller Plan, benign polyps, repeat in 10 yrs    DILATION AND EVACUATION      2 times   scraping of uterus     20 years ago   [facility]     Patient Active Problem List   Diagnosis Date Noted   IBS (irritable bowel syndrome) 06/10/2021   Raynaud's syndrome 06/10/2021   Primary hypertension 10/02/2020    Ductal carcinoma in situ (DCIS) of right breast 05/20/2020   Acquired hypothyroidism 06/09/2018   Seasonal allergies 06/09/2018   Depression, recurrent ([place]) 06/09/2018   Insomnia 01/15/2015   Low back pain 08/20/2014   Allergic rhinitis 02/22/2014   ADD (attention deficit disorder) 12/31/2011   Perimenopausal vasomotor symptoms 12/31/2011   Fibrocystic breast disease 04/14/2011    REFERRING DIAG: right breast cancer at risk for lymphedema  THERAPY DIAG: Aftercare following surgery for neoplasm  PERTINENT HISTORY: Patient was diagnosed on 05/15/2020 with right DCIS. She underwent a right lumpectomy and sentinel node biopsy on 07/02/2020 with 2 negative nodes removed. It is ER positive and PR negative.  PRECAUTIONS: right UE Lymphedema risk, None  SUBJECTIVE: Pt returns for her 3 month L-Dex screen.   PAIN:  Are you having pain? No  SOZO SCREENING: Patient was assessed today using the SOZO machine to determine the lymphedema index score. This was compared to her baseline score. It was determined that she is within the recommended range when compared to her baseline and no further action is needed at this time. She will continue SOZO screenings. These are done every 3 months for 2 years post operatively followed by every 6 months for 2 years, and then annually.   L-DEX FLOWSHEETS - 05/04/22 1600  L-DEX LYMPHEDEMA SCREENING   Measurement Type Unilateral    L-DEX MEASUREMENT EXTREMITY Upper Extremity    POSITION  Standing    DOMINANT SIDE Right    At Risk Side Right    BASELINE SCORE (UNILATERAL) -1.2    L-DEX SCORE (UNILATERAL) -1.8    VALUE CHANGE (UNILAT) -0.6              Otelia Limes, PTA 05/04/2022, 4:26 PM

## 2022-05-06 ENCOUNTER — Ambulatory Visit: Payer: 59 | Admitting: Family Medicine

## 2022-05-12 ENCOUNTER — Other Ambulatory Visit: Payer: Self-pay | Admitting: Hematology and Oncology

## 2022-05-12 ENCOUNTER — Ambulatory Visit: Payer: 59 | Admitting: Psychiatry

## 2022-05-12 DIAGNOSIS — Z853 Personal history of malignant neoplasm of breast: Secondary | ICD-10-CM

## 2022-05-27 ENCOUNTER — Ambulatory Visit (INDEPENDENT_AMBULATORY_CARE_PROVIDER_SITE_OTHER): Payer: 59 | Admitting: Psychiatry

## 2022-05-27 DIAGNOSIS — F411 Generalized anxiety disorder: Secondary | ICD-10-CM | POA: Diagnosis not present

## 2022-05-27 NOTE — Progress Notes (Signed)
[facility] Counselor/Therapist Progress Note  Patient ID: Sarah Hall, MRN: [id number],    Date: 05/27/2022  Time Spent: 55 minutes   Treatment Type: Individual Therapy  Reported Symptoms: Anxiety, depression (both improved), motivation some better  Mental Status Exam:  Appearance:   Neat     Behavior:  Appropriate, Sharing, and Motivated  Motor:  Normal  Speech/Language:   Clear and Coherent  Affect:  Anxiety (improving), depression  Mood:  Anxious (improving), some depression  Thought process:  anxious and some depression  Thought content:    Rumination  Sensory/Perceptual disturbances:    WNL  Orientation:  oriented to person, place, time/date, situation, day of week, month of year, year, and stated date of Dec. 6, 2023  Attention:  Good  Concentration:  Fair and Poor  Memory:  Some short term memory issues  Fund of knowledge:   Good  Insight:    Good and Fair  Judgment:   Good  Impulse Control:  Good and Fair   Risk Assessment: Danger to Self:  No Self-injurious Behavior: No Danger to Others: No Duty to Warn:no Physical Aggression / Violence:No  Access to Firearms a concern: No  Gang Involvement:No   Subjective: Patient today reporting she is functioning better while dealing with multiple stressors including emotional pain related to divorce, and physical pain. Getting help for fluid retention, sciatica, and back pain and does note some improvement. Participated in a Friendsgiving event this Thanksgiving. Saw son for 4 days. Processed some sadnesses as well as some gratitude. Bought new journal an documenting some of her gratitude in it. Praying more, for acceptance and understanding. Working to lot let ex-husband's behavior impact her as strongly as it used to affect her. Working to further reduce overthinking, overanalyzing, and not to assume worst case scenarios. Also working to believe more in her strength to continue working with her goal directed  behaviors as she is showing more strength and perseverance towards her goals. Anger much more reduced. Anxiety and depression both decreasing some.Not feeling like an emotional roller coaster currently. Better self-care. More interactive with others. Not as much of an emotional roller coaster as in earlier times. Reports continued work on letting go of former husband. Still working to let go of former husband, and definitely showing increased strength.Trying "not to stay in my head too much."  Interventions: Cognitive Behavioral Therapy, Solution-Oriented/Positive Psychology, and Ego-Supportive  Long term goal: Develop healthy cognitive patterns and beliefs about self and the world that lead to alleviation and help prevent the relapse of depression symptoms. Short-term goal: Verbalize an understanding of the relationship between repressed anger and depressed mood.  Strategies: Use positive conflict resolution skills to resolve interpersonal discord and to make needs and expectations known.  Diagnosis:   ICD-10-CM   1. Generalized anxiety disorder  F41.1      Plan: Patient actively participating in session today continuing her work of healing after divorce and dealing with multiple pieces from her divorce and moving forward. Trying to leave the past and hurts behind and move forward.  Elected on the good work she did in session today and to continue with this mindset of healing and moving forward, while continuing her goal-directed behaviors and be able to reflect on her progress already made and the increased strength that she is feeling. To return in approximately 4 to 6 weeks. Encourage patient in her practice of more positive behaviors as noted in session including: Believing more in herself and her  abilities to continue making significant changes, reflect on progress already made, accentuate the positives within herself and others, continue her improved self-care and healing, stay in the present  focused on what she can change her control, look for more positives versus negatives daily, healthy nutrition and exercise, positive self talk, being around people who are supportive more often, listening to others and paying attention to "how I say what I say", saying no when she needs to say no, intentionally working more on letting go of the past so as to move forward, continue working on changes within family relationships, emphasize wise decision making, and recognize the strengths she shows working with goal-directed behaviors to move in a direction that supports her improved emotional health and overall outlook.  Goal review and progress/challenges noted with patient.  Next appointment within approximately 3 weeks..  This record has been created using [employer].  Chart creation errors have been sought, but may not always have been located and corrected.  Such creation errors do not reflect on the standard of medical care provided.   Shanon Ace, LCSW

## 2022-05-28 ENCOUNTER — Ambulatory Visit
Admission: RE | Admit: 2022-05-28 | Discharge: 2022-05-28 | Disposition: A | Discharge status: Home / Self Care | Payer: 59 | Source: Ambulatory Visit | Attending: Hematology and Oncology | Admitting: Hematology and Oncology

## 2022-05-28 DIAGNOSIS — Z853 Personal history of malignant neoplasm of breast: Secondary | ICD-10-CM

## 2022-05-29 ENCOUNTER — Other Ambulatory Visit: Payer: Self-pay | Admitting: Family Medicine

## 2022-05-29 DIAGNOSIS — J309 Allergic rhinitis, unspecified: Secondary | ICD-10-CM

## 2022-06-01 ENCOUNTER — Encounter: Payer: Self-pay | Admitting: Family Medicine

## 2022-06-01 ENCOUNTER — Ambulatory Visit: Payer: 59 | Admitting: Family Medicine

## 2022-06-01 VITALS — BP 124/80 | HR 92 | Temp 98.2°F | Wt 122.1 lb

## 2022-06-01 DIAGNOSIS — I10 Essential (primary) hypertension: Secondary | ICD-10-CM

## 2022-06-01 DIAGNOSIS — M25471 Effusion, right ankle: Secondary | ICD-10-CM

## 2022-06-01 DIAGNOSIS — M25472 Effusion, left ankle: Secondary | ICD-10-CM | POA: Diagnosis not present

## 2022-06-01 DIAGNOSIS — F900 Attention-deficit hyperactivity disorder, predominantly inattentive type: Secondary | ICD-10-CM | POA: Insufficient documentation

## 2022-06-01 MED ORDER — LISDEXAMFETAMINE DIMESYLATE 40 MG PO CAPS: 40.0000 mg | ORAL_CAPSULE | ORAL | 0 refills | Status: DC

## 2022-06-01 NOTE — Progress Notes (Signed)
   Subjective:    Patient ID: Sarah Hall, female    DOB: Apr 13, 1961, 61 y.o.   MRN: [id number]  HPI Here for several issues. First she has noticed a tiny amount of swelling in her ankles over the past 3 months. Her weight has not changed. She has no SOB. The swelling feels tight but it is not painful. Second her BP has been well controlled at home, averaging 120's over 70's. She asks if she can come off the Lisinopril. Third she has not been happy with the effects of Adderall, and she wants to try something else.    Review of Systems  Constitutional: Negative.   Respiratory: Negative.    Cardiovascular:  Positive for leg swelling. Negative for chest pain and palpitations.  Neurological: Negative.   Psychiatric/Behavioral:  Positive for decreased concentration. Negative for agitation and dysphoric mood. The patient is not nervous/anxious.        Objective:   Physical Exam Constitutional:      Appearance: Normal appearance. She is not ill-appearing.  Cardiovascular:     Rate and Rhythm: Normal rate and regular rhythm.     Pulses: Normal pulses.     Heart sounds: Normal heart sounds.  Pulmonary:     Effort: Pulmonary effort is normal.     Breath sounds: Normal breath sounds.  Musculoskeletal:     Comments: Trace edema in both ankles   Neurological:     General: No focal deficit present.     Mental Status: She is alert and oriented to person, place, and time.           Assessment & Plan:  She has very mild ankle edema with no other signs of fluid overload. We agreed that diuretics are not needed. She can elevate her feet  whenever she is sitting, and she can wear compression stockings as needed. Her HTN is very well controlled, so we will stop the Lisinopril. I asked her to monitor the BP and report back to [place] in 3-4 weeks. For the ADHD, we will stop Adderall and try Vyvanse 40 mg daily. Report back in 3-4 weeks.  Alysia Penna, MD

## 2022-06-25 ENCOUNTER — Ambulatory Visit: Payer: 59 | Admitting: Psychiatry

## 2022-07-01 ENCOUNTER — Telehealth: Payer: Self-pay | Admitting: Family Medicine

## 2022-07-01 MED ORDER — AMPHETAMINE-DEXTROAMPHETAMINE 20 MG PO TABS: 20.0000 mg | ORAL_TABLET | Freq: Two times a day (BID) | ORAL | 0 refills | Status: DC

## 2022-07-01 NOTE — Telephone Encounter (Signed)
I sent in the Adderall

## 2022-07-01 NOTE — Telephone Encounter (Signed)
Pt notified Via My Chart.

## 2022-07-01 NOTE — Telephone Encounter (Signed)
Pt does not want vyvanse anymore and would like to go back on generic adderall 20 mg twice a day  [id number] - [place], [address] AT [place] Phone: [phone]  Fax: [phone]

## 2022-07-13 ENCOUNTER — Ambulatory Visit: Payer: 59 | Admitting: Psychiatry

## 2022-07-13 DIAGNOSIS — F411 Generalized anxiety disorder: Secondary | ICD-10-CM

## 2022-07-13 NOTE — Progress Notes (Signed)
[facility] Counselor/Therapist Progress Note  Patient ID: Sarah Hall, MRN: [id number],    Date: 07/13/2022  Time Spent: 48 minutes   Treatment Type: Individual Therapy  Reported Symptoms: anxious, "short fuse" , irritable, depressed, "scatter-brained at times when more stressed", "focus can be worse as life happens", overthinkings, "poorer attention" that started around the holidays.   (Is having her physical with Dr. Sarajane Jews)  Mental Status Exam:  Appearance:   Casual and Neat     Behavior:  Appropriate, Sharing, and Motivated  Motor:  Normal  Speech/Language:   Clear and Coherent  Affect:  Anxious, depressed  Mood:  anxious and depressed  Thought process:  goal directed  Thought content:    appropriate  Sensory/Perceptual disturbances:    WNL  Orientation:  oriented to person, place, time/date, situation, day of week, month of year, year, and stated date of Jan. 22, 2024  Attention:  Poor  Concentration:  Fair and sometimes poor more recently   Memory:  "Worried about my short term memory as she feels it has worsened"  Fund of knowledge:   Good  Insight:    Good and Fair  Judgment:   Good  Impulse Control:  Good   Risk Assessment: Danger to Self:  No Self-injurious Behavior: No Danger to Others: No Duty to Warn:no Physical Aggression / Violence:No  Access to Firearms a concern: No  Gang Involvement:No   Subjective:  Patient in today reporting increased anxiety, depression, irritability, scatter-brained at times esp when stressed, focus not as good at times, overthinking, attention is more poor, "short fuse" and states her symptoms worsened around the holidays and have remained an issue for her. To see her PCP next week for her annual physical.  Concerned "that I lose my words some". More anxious today and processes this in session at length.  Book on "manifesting: 7 steps to living your best life" which she began reading more recently and first section is about  "her vision" which she "has not clarity". Processing today about her "vision". Tendency to focus on others versus myself and plans to work with some journaling along with the book. Relates strongly to her need to feel more grounded and work with behaviors that encourage groundedness, which we began to day. She intends to work with the book more and some journaling between now and next appt. Boundaries are helping me. Not on emotional roller coaster as previously.  Reporting good self-care.  Healthy interactions with others.  Continues working to believe more in herself and her own strength with goal-directed behaviors.  Trying not to let her ex-husband's behavior impact her so strongly.  Working to reduce overthinking.  Is more active with other people.  States she is having some emotional volatility now but not as much as an earlier times.  Continues the journey of letting go of former husband.  Reports good self-care.  Healthy interactions with friends.  Interventions: Cognitive Behavioral Therapy and Ego-Supportive  Long term goal: Develop healthy cognitive patterns and beliefs about self and the world that lead to alleviation and help prevent the relapse of depression symptoms. Short-term goal: Verbalize an understanding of the relationship between repressed anger and depressed mood.  Strategies: Use positive conflict resolution skills to resolve interpersonal discord and to make needs and expectations known.  Diagnosis:   ICD-10-CM   1. Generalized anxiety disorder  F41.1      Plan:  Patient today showing good participation in session and good motivation as  she continued work on her anxiety "is my main symptom", irritability, depression, and feeling scatterbrained at times especially when stressed.  Tendency to overthink and having more of a "short fuse" at times which she reports worsened during the holidays and "maybe not as bad right now".  She sees Dr. Alysia Penna for her annual physical  next week and is going to mention to him about a possible need for some antianxiety medication.  States that she is going to see if he wants to work with her on that or if he would prefer that she see a med provider here at [facility] and will let us know.  Denies any thoughts to harm self or others, and is motivated to better manage her anxiety and stress, and be able to self-calm or when anxiety is heightened.  Working to try and leave more of the past in the past and her be able to move forward.  Is having a mindset for healing and wanting to have less anxiety as she works further on this. Encouraged patient and practicing positive behaviors as discussed in session including: Believing more in herself and her abilities to continue making significant change, accentuate the positives within herself and others, continue improved self-care and healing, stay in the present focused on what she can change or control, look for more positives versus negatives daily, healthy nutrition and exercise, positive self talk, staying in contact with supportive people, paying attention to "how I say what I say" in conversations, saying no when she needs to say no, intentionally working more on letting go of the past so as to move forward, being willing to work on certain changes within family relationships, emphasize wise decision making, and realize the strength she shows working with goal-directed behaviors to move in a direction that supports her improved emotional health and wellbeing.  Goal review and progress/challenges noted with patient.  Next appointment within 4 to 6 weeks.  This record has been created using [employer].  Chart creation errors have been sought, but may not always have been located and corrected.  Such creation errors do not reflect on the standard of medical care provided.   Shanon Ace, LCSW

## 2022-07-14 ENCOUNTER — Encounter: Payer: Self-pay | Admitting: Family Medicine

## 2022-07-14 ENCOUNTER — Ambulatory Visit (INDEPENDENT_AMBULATORY_CARE_PROVIDER_SITE_OTHER): Payer: 59 | Admitting: Family Medicine

## 2022-07-14 VITALS — BP 130/98 | HR 75 | Temp 98.3°F | Ht 63.0 in | Wt 125.8 lb

## 2022-07-14 DIAGNOSIS — F419 Anxiety disorder, unspecified: Secondary | ICD-10-CM | POA: Insufficient documentation

## 2022-07-14 DIAGNOSIS — Z0000 Encounter for general adult medical examination without abnormal findings: Secondary | ICD-10-CM | POA: Diagnosis not present

## 2022-07-14 DIAGNOSIS — E039 Hypothyroidism, unspecified: Secondary | ICD-10-CM

## 2022-07-14 LAB — HEPATIC FUNCTION PANEL
ALT: 14 U/L (ref 0–35)
AST: 19 U/L (ref 0–37)
Albumin: 4.4 g/dL (ref 3.5–5.2)
Alkaline Phosphatase: 52 U/L (ref 39–117)
Bilirubin, Direct: 0.1 mg/dL (ref 0.0–0.3)
Total Bilirubin: 0.5 mg/dL (ref 0.2–1.2)
Total Protein: 6.8 g/dL (ref 6.0–8.3)

## 2022-07-14 LAB — CBC WITH DIFFERENTIAL/PLATELET
Basophils Absolute: 0 10*3/uL (ref 0.0–0.1)
Basophils Relative: 0.6 % (ref 0.0–3.0)
Eosinophils Absolute: 0 10*3/uL (ref 0.0–0.7)
Eosinophils Relative: 0.7 % (ref 0.0–5.0)
HCT: 37.6 % (ref 36.0–46.0)
Hemoglobin: 13 g/dL (ref 12.0–15.0)
Lymphocytes Relative: 36.2 % (ref 12.0–46.0)
Lymphs Abs: 1.6 10*3/uL (ref 0.7–4.0)
MCHC: 34.7 g/dL (ref 30.0–36.0)
MCV: 91.7 fL (ref 78.0–100.0)
Monocytes Absolute: 0.4 10*3/uL (ref 0.1–1.0)
Monocytes Relative: 9.7 % (ref 3.0–12.0)
Neutro Abs: 2.3 10*3/uL (ref 1.4–7.7)
Neutrophils Relative %: 52.8 % (ref 43.0–77.0)
Platelets: 195 10*3/uL (ref 150.0–400.0)
RBC: 4.1 Mil/uL (ref 3.87–5.11)
RDW: 13.4 % (ref 11.5–15.5)
WBC: 4.3 10*3/uL (ref 4.0–10.5)

## 2022-07-14 LAB — BASIC METABOLIC PANEL
BUN: 12 mg/dL (ref 6–23)
CO2: 30 meq/L (ref 19–32)
Calcium: 9.4 mg/dL (ref 8.4–10.5)
Chloride: 104 meq/L (ref 96–112)
Creatinine, Ser: 0.63 mg/dL (ref 0.40–1.20)
GFR: 97.01 mL/min (ref >60.00)
Glucose, Bld: 102 mg/dL — ABNORMAL HIGH (ref 70–99)
Potassium: 4 meq/L (ref 3.5–5.1)
Sodium: 142 meq/L (ref 135–145)

## 2022-07-14 LAB — T3, FREE: T3, Free: 2.7 pg/mL (ref 2.3–4.2)

## 2022-07-14 LAB — LIPID PANEL
Cholesterol: 189 mg/dL (ref 0–200)
HDL: 97.3 mg/dL (ref >39.00)
LDL Cholesterol: 82 mg/dL (ref 0–99)
NonHDL: 91.93
Total CHOL/HDL Ratio: 2
Triglycerides: 52 mg/dL (ref 0.0–149.0)
VLDL: 10.4 mg/dL (ref 0.0–40.0)

## 2022-07-14 LAB — HEMOGLOBIN A1C: Hgb A1c MFr Bld: 5.1 % (ref 4.6–6.5)

## 2022-07-14 LAB — TSH: TSH: 0.47 u[IU]/mL (ref 0.35–5.50)

## 2022-07-14 LAB — T4, FREE: Free T4: 0.93 ng/dL (ref 0.60–1.60)

## 2022-07-14 MED ORDER — ESCITALOPRAM OXALATE 10 MG PO TABS: 10.0000 mg | ORAL_TABLET | Freq: Every day | ORAL | 2 refills | Status: DC

## 2022-07-14 MED ORDER — LISINOPRIL 10 MG PO TABS: 10.0000 mg | ORAL_TABLET | Freq: Every day | ORAL | 3 refills | Status: DC

## 2022-07-14 NOTE — Progress Notes (Signed)
Subjective:    Patient ID: Sarah Hall, female    DOB: [birth year], 62 y.o.   MRN: [id number]  HPI Here for a well exam. She has been monitoring her BP, and since we stopped the Lisinopril this has gone back up a bit. She has been averaging 140's over 90's. Her ankle swelling has gone away since she stopped taking Tamoxifen. Her GYN, Dr. Ronita Hall, has been supplying her Levothyroxine, but she asks [place] to take over managing this. She also asks for help with anxiety. She denies any depression recently, but she feels anxiety and tension frequently. She sees a therapist regularly, and she recommended Sarah Hall talk to [place] about this. She has taken Wellbutrin, Celexa, and Rexalti in the past with mixed success.    Review of Systems  Constitutional: Negative.   HENT: Negative.    Eyes: Negative.   Respiratory: Negative.    Cardiovascular: Negative.   Gastrointestinal: Negative.   Genitourinary:  Negative for decreased urine volume, difficulty urinating, dyspareunia, dysuria, enuresis, flank pain, frequency, hematuria, pelvic pain and urgency.  Musculoskeletal: Negative.   Skin: Negative.   Neurological: Negative.  Negative for headaches.  Psychiatric/Behavioral:  Positive for sleep disturbance. Negative for agitation, behavioral problems, confusion, decreased concentration, dysphoric mood and hallucinations. The patient is nervous/anxious.        Objective:   Physical Exam Constitutional:      General: She is not in acute distress.    Appearance: Normal appearance. She is well-developed.  HENT:     Head: Normocephalic and atraumatic.     Right Ear: External ear normal.     Left Ear: External ear normal.     Nose: Nose normal.     Mouth/Throat:     Pharynx: No oropharyngeal exudate.  Eyes:     General: No scleral icterus.    Conjunctiva/sclera: Conjunctivae normal.     Pupils: Pupils are equal, round, and reactive to light.  Neck:     Thyroid: No thyromegaly.     Vascular: No JVD.   Cardiovascular:     Rate and Rhythm: Normal rate and regular rhythm.     Heart sounds: Normal heart sounds. No murmur heard.    No friction rub. No gallop.  Pulmonary:     Effort: Pulmonary effort is normal. No respiratory distress.     Breath sounds: Normal breath sounds. No wheezing or rales.  Chest:     Chest wall: No tenderness.  Abdominal:     General: Bowel sounds are normal. There is no distension.     Palpations: Abdomen is soft. There is no mass.     Tenderness: There is no abdominal tenderness. There is no guarding or rebound.  Musculoskeletal:        General: No tenderness. Normal range of motion.     Cervical back: Normal range of motion and neck supple.  Lymphadenopathy:     Cervical: No cervical adenopathy.  Skin:    General: Skin is warm and dry.     Findings: No erythema or rash.  Neurological:     Mental Status: She is alert and oriented to person, place, and time.     Cranial Nerves: No cranial nerve deficit.     Motor: No abnormal muscle tone.     Coordination: Coordination normal.     Deep Tendon Reflexes: Reflexes are normal and symmetric. Reflexes normal.  Psychiatric:        Mood and Affect: Mood normal.  Behavior: Behavior normal.        Thought Content: Thought content normal.        Judgment: Judgment normal.           Assessment & Plan:  Well exam. We discussed diet and exercise. Get fasting labs. We will assume management of her hypothyroidism, and we will check labs today. She will start back on Lisinopril 10 mg daily for the HTN. She will try Lexapro 10 mg daily for the anxiety. Report back in one month. Sarah Penna, MD

## 2022-08-10 ENCOUNTER — Ambulatory Visit: Payer: 59 | Admitting: Psychiatry

## 2022-08-10 DIAGNOSIS — F411 Generalized anxiety disorder: Secondary | ICD-10-CM

## 2022-08-10 NOTE — Progress Notes (Signed)
[facility] Counselor/Therapist Progress Note  Patient ID: Sarah Hall, MRN: [id number],    Date: 08/10/2022  Time Spent: 50 minutes  Treatment Type: Individual Therapy  Reported Symptoms: anxiety, some depression (improving) "but mostly anxiety",  She did get Lexapro Rx from her PCP and feels it is helping and "little things that used to bother me, no longer bothers me." Less worrying. Motivation not quite as good.  Mental Status Exam:  Appearance:   Casual and Neat     Behavior:  Appropriate, Sharing, and Motivated  Motor:  Normal  Speech/Language:   Clear and Coherent  Affect:  Some depression and anxiety but depression is improving  Mood:  anxious  Thought process:  goal directed  Thought content:    Rumination  Sensory/Perceptual disturbances:    WNL  Orientation:  oriented to person, place, time/date, situation, day of week, month of year, year, and stated date of Feb. 19, 2024  Attention:  Good  Concentration:  Good  Memory:  WNL  Fund of knowledge:   Good  Insight:    Good  Judgment:   Good  Impulse Control:  Good   Risk Assessment: Danger to Self:  No Self-injurious Behavior: No Danger to Others: No Duty to Warn:no Physical Aggression / Violence:No  Access to Firearms a concern: No  Gang Involvement:No   Subjective: Patient in today reporting anxiety, depression (decreased), less irritability, focus still not quite as good at times, scatter-brained at times and feels this is related more to adhd. Less of a short fuse, attention "not too good", some loss of words at times, some questionmarks about her memory and covid and is in touch with her PCP Dr. Sharlene Motts about this.  Joined a book club recently and Mel Robbin's info online and printed it out which was helpful. Also still using her book on "Manifesting: 7 steps to living your best life" which she has found helpful.Good boundaries are helpful.Not feeling like she is on roller coaster. Trying to things that  will help her feel more grounded as she works with behaviors that support groundedness and moving forward. Improving in her belief in herself, her strength, not dwelling on the need to be perfect. Wants to be more forgiving. Trying to reduce her overththinking, and not give so much power to ex-husband's behavior or words. Increasing social life. Reports less emotional volatility.Involved with friends more.   Interventions: Cognitive Behavioral Therapy and Ego-Supportive  Long term goal: Develop healthy cognitive patterns and beliefs about self and the world that lead to alleviation and help prevent the relapse of depression symptoms. Short-term goal: Verbalize an understanding of the relationship between repressed anger and depressed mood.  Strategies: Use positive conflict resolution skills to resolve interpersonal discord and to make needs and expectations known.  Diagnosis:   ICD-10-CM   1. Generalized anxiety disorder  F41.1      Plan: Patient in today showing good motivation and active participation in session as she worked on her anxiety, some relationship issues, sharing habits that she has begun that are helpful for her, noticing less depression and irritability, and being more social.  Has noticed a sense of more groundedness which is a really good thing for her.  Continues to try to decrease her overthinking and give less power to her ex-husband's words and behavior.  Showing more self-confidence and willingness to try new things which she is finding helpful.  Is making progress and needs to continue with goal-directed behaviors to keep  heading in a forward direction. Encouraged patient in her practice of positive and self affirming behaviors as discussed in session including: Accentuate the positives within herself and others, believing more in herself and her ability to continue making significant changes, continue improved self-care, stay in the present focused on what she can change or  control, look for more positives versus negatives each day, healthy nutrition and exercise, positive self talk, staying in contact with supportive people, paying attention to "how I say what I say" in conversations, saying no when she needs to say no, intentionally working more on letting go of the past so as to move forward, being willing to work on certain changes within family relationships, emphasize wise decision making, and recognize the strength she shows working with goal-directed behaviors to move in a direction that supports her improved emotional health and outlook.  Review and progress/challenges noted with patient.  Next appointment within 3 weeks.  This record has been created using [employer].  Chart creation errors have been sought, but may not always have been located and corrected.  Such creation errors do not reflect on the standard of medical care provided.   Shanon Ace, LCSW

## 2022-08-24 ENCOUNTER — Ambulatory Visit: Payer: 59 | Admitting: Psychiatry

## 2022-09-14 ENCOUNTER — Ambulatory Visit: Payer: 59 | Admitting: Psychiatry

## 2022-09-14 DIAGNOSIS — F411 Generalized anxiety disorder: Secondary | ICD-10-CM

## 2022-09-14 NOTE — Progress Notes (Signed)
[facility] Counselor/Therapist Progress Note  Patient ID: Sarah Hall, MRN: [id number],    Date: 09/14/2022  Time Spent: 50 minutes   Treatment Type: Individual Therapy  Reported Symptoms: anxiety (improving), easily distracted "but Adderall helps", worries"; issues re: son and ex-husband, worries/concerns about elderly mom; reporting progress today in each of these areas.  Mental Status Exam:  Appearance:   Casual     Behavior:  Appropriate, Sharing, and Motivated  Motor:  Normal  Speech/Language:   Clear and Coherent  Affect:  anxious  Mood:  anxious  Thought process:  goal directed  Thought content:    Some obsessive thoughts  Sensory/Perceptual disturbances:    WNL  Orientation:  oriented to person, place, time/date, situation, day of week, month of year, year, and stated date of September 14, 2022  Attention:  Fair  Concentration:  Fair  Memory:   "Some memory concerns and is talking with my PCP about it"  Fund of knowledge:   Good  Insight:    Good and Fair  Judgment:   Good  Impulse Control:  Good   Risk Assessment: Danger to Self:  No Self-injurious Behavior: No Danger to Others: No Duty to Warn:no Physical Aggression / Violence:No  Access to Firearms a concern: No  Gang Involvement:No   Subjective:   Patient today reporting anxiety, which is improving. Depression decreased significantly.  Also reporting distraction which Adderall helps, worries about elderly mother, and concerns re: adult son and ex-husband. Anxiety increased some but "am trying to use my tools we've spoken about." Needed to work on some issues of control and anxiety re: her ex-husband and adult son. Improving some in "trying to let go", trying to decrease my impulsivity, and not overly focus on ex-husband. Processed issues related relationships, control issues, and working to let-go further of ex-husband. Met someone recently who she would like to date and he has asked her out for date  later this week. 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  Progress noted in several areas.  Less of a short fuse.  Less self judgment.  Continues to be involved in a book club that encourages personal growth.  No longer feeling like she is on an emotional roller coaster.  Working to be involved with people and in activities that helps her feel more grounded.  Believing more in herself and her ability and strength to move forward.  Less perfectionism.  Some decrease in overthinking.  Intentionally increasing social life and reporting less emotional volatility and increased self esteem.  Interventions: Cognitive Behavioral Therapy and Ego-Supportive  Long term goal: Develop healthy cognitive patterns and beliefs about self and the world that lead to alleviation and help prevent the relapse of depression symptoms. Short-term goal: Verbalize an understanding of the relationship between repressed anger and depressed mood.  Strategies: Use positive conflict resolution skills to resolve interpersonal discord and to make needs and expectations known.  Diagnosis:   ICD-10-CM   1. Generalized anxiety disorder  F41.1      Plan:  Patient in today actively participating in session today and is definitely showing progress in treatment goals.  Motivation continues to be good and she is feeling good about some of her progress as well.  Needs to continue working with goal-directed  behaviors so as to keep moving in a forward direction. Encouraged patient and practicing more positive and self affirming behaviors as noted in session including: Believing more in herself and her ability to continue making significant changes, continue improved self-care, remain in the present and focus on what she can  control or change, looking for more positives versus negatives daily, healthy nutrition and exercise, positive self talk, staying in contact with supportive people, saying no when she needs to say no, intentionally working more on letting go over the past so as to move forward, emphasize wise decision making, accentuate the positives within herself and others, and recognize the strength she shows working with goal-directed behaviors to move in a direction that supports her improved emotional health and overall wellbeing.  Self rating scales: 1-10 depression scale-3 1-10 anxiety scale-6  Goal review and progress/challenges noted with patient.  Next appointment within 3 weeks.  This record has been created using [employer].  Chart creation errors have been sought, but may not always have been located and corrected.  Such creation errors do not reflect on the standard of medical care provided.   Shanon Ace, LCSW

## 2022-10-13 ENCOUNTER — Other Ambulatory Visit: Payer: Self-pay | Admitting: Family Medicine

## 2022-10-20 ENCOUNTER — Ambulatory Visit: Payer: 59 | Admitting: Psychiatry

## 2022-10-20 ENCOUNTER — Telehealth: Payer: Self-pay | Admitting: Family Medicine

## 2022-10-20 ENCOUNTER — Other Ambulatory Visit: Payer: Self-pay | Admitting: Family Medicine

## 2022-10-20 DIAGNOSIS — G4700 Insomnia, unspecified: Secondary | ICD-10-CM

## 2022-10-20 DIAGNOSIS — J309 Allergic rhinitis, unspecified: Secondary | ICD-10-CM

## 2022-10-20 NOTE — Telephone Encounter (Signed)
Prescription Request  10/20/2022  LOV: 07/14/2022  What is the name of the medication or equipment?     temazepam (RESTORIL) 30 MG capsule  Have you contacted your pharmacy to request a refill? No   Which pharmacy would you like this sent to?  [id number] - [place], [place] - [address] [facility] [address] [place] [address] Phone: [phone] Fax: [phone]    Patient notified that their request is being sent to the clinical staff for review and that they should receive a response within 2 business days.   Please advise at Mobile [phone] (mobile)

## 2022-10-21 NOTE — Telephone Encounter (Signed)
Pt LOV 07/14/22 Last refill done on 02/24/22 Please advise

## 2022-10-22 MED ORDER — TEMAZEPAM 30 MG PO CAPS: 30.0000 mg | ORAL_CAPSULE | Freq: Every day | ORAL | 1 refills | Status: DC

## 2022-10-22 NOTE — Telephone Encounter (Signed)
Done

## 2022-11-02 ENCOUNTER — Ambulatory Visit: Payer: 59

## 2022-11-17 ENCOUNTER — Ambulatory Visit (INDEPENDENT_AMBULATORY_CARE_PROVIDER_SITE_OTHER): Payer: 59 | Admitting: Psychiatry

## 2022-11-17 DIAGNOSIS — F411 Generalized anxiety disorder: Secondary | ICD-10-CM | POA: Diagnosis not present
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
## 2022-11-19 ENCOUNTER — Telehealth: Payer: Self-pay | Admitting: Family Medicine
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
## 2022-11-23 ENCOUNTER — Ambulatory Visit: Payer: 59 | Attending: Surgery

## 2022-11-23 VITALS — Wt 132.4 lb

## 2022-11-23 DIAGNOSIS — Z483 Aftercare following surgery for neoplasm: Secondary | ICD-10-CM | POA: Insufficient documentation
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
## 2022-11-24 NOTE — Telephone Encounter (Signed)
Spoke with pt stated that pharmacy did not refill 90 days, advised to call them and ave them refill since she has enough refills. Pt will call back if needs further assistant

## 2022-11-24 NOTE — Telephone Encounter (Signed)
I do not understand. On 10-22-22 I sent in for #90 (a 3 month supply) with one refill, but she only picked up #30 that day. Why?
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
## 2022-11-30 ENCOUNTER — Telehealth: Payer: Self-pay | Admitting: Family Medicine

## 2022-11-30 NOTE — Telephone Encounter (Signed)
Prescription Request  11/30/2022  LOV: 07/14/2022  What is the name of the medication or equipment? amphetamine-dextroamphetamine (ADDERALL) 20 MG tablet   Have you contacted your pharmacy to request a refill? No   Which pharmacy would you like this sent to?  [id number] - [place], [place] - [address] AT [facility] [address] [place] [address] Phone: [phone] Fax: [phone]    Patient notified that their request is being sent to the clinical staff for review and that they should receive a response within 2 business days.   Please advise at Mobile [phone] (mobile)

## 2022-12-01 NOTE — Telephone Encounter (Signed)
Pt LOV was on 07/14/22 Last refill was done on 08/30/22 Please advise

## 2022-12-02 MED ORDER — AMPHETAMINE-DEXTROAMPHETAMINE 20 MG PO TABS: 20.0000 mg | ORAL_TABLET | Freq: Two times a day (BID) | ORAL | 0 refills | Status: DC

## 2022-12-02 NOTE — Addendum Note (Signed)
Addended by: Gershon Crane A on: 12/02/2022 01:04 PM   Modules accepted: Orders

## 2022-12-02 NOTE — Telephone Encounter (Signed)
Done

## 2022-12-15 ENCOUNTER — Ambulatory Visit: Payer: 59 | Admitting: Psychiatry

## 2022-12-15 DIAGNOSIS — F411 Generalized anxiety disorder: Secondary | ICD-10-CM
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
## 2022-12-31 ENCOUNTER — Ambulatory Visit (INDEPENDENT_AMBULATORY_CARE_PROVIDER_SITE_OTHER): Payer: 59 | Admitting: Psychiatry

## 2022-12-31 DIAGNOSIS — F411 Generalized anxiety disorder: Secondary | ICD-10-CM | POA: Diagnosis not present
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
## 2023-01-06 ENCOUNTER — Other Ambulatory Visit: Payer: Self-pay | Admitting: Family Medicine

## 2023-01-12 ENCOUNTER — Ambulatory Visit: Payer: 59 | Admitting: Psychiatry

## 2023-01-18 ENCOUNTER — Ambulatory Visit (INDEPENDENT_AMBULATORY_CARE_PROVIDER_SITE_OTHER): Payer: 59 | Admitting: Psychiatry

## 2023-01-18 DIAGNOSIS — F411 Generalized anxiety disorder: Secondary | ICD-10-CM
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
## 2023-02-16 ENCOUNTER — Ambulatory Visit: Payer: 59 | Admitting: Psychiatry

## 2023-02-16 DIAGNOSIS — F411 Generalized anxiety disorder: Secondary | ICD-10-CM | POA: Diagnosis not present
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
## 2023-02-21 ENCOUNTER — Other Ambulatory Visit: Payer: Self-pay | Admitting: Family Medicine

## 2023-02-21 DIAGNOSIS — J309 Allergic rhinitis, unspecified: Secondary | ICD-10-CM

## 2023-03-30 ENCOUNTER — Ambulatory Visit: Payer: 59 | Admitting: Psychiatry

## 2023-03-30 DIAGNOSIS — F411 Generalized anxiety disorder: Secondary | ICD-10-CM | POA: Diagnosis not present
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
## 2023-04-18 ENCOUNTER — Other Ambulatory Visit: Payer: Self-pay | Admitting: Family Medicine

## 2023-04-27 ENCOUNTER — Other Ambulatory Visit: Payer: Self-pay

## 2023-04-27 ENCOUNTER — Telehealth: Payer: Self-pay | Admitting: Family Medicine

## 2023-04-27 ENCOUNTER — Other Ambulatory Visit: Payer: Self-pay | Admitting: Family Medicine

## 2023-04-27 DIAGNOSIS — G4700 Insomnia, unspecified: Secondary | ICD-10-CM

## 2023-04-27 DIAGNOSIS — J309 Allergic rhinitis, unspecified: Secondary | ICD-10-CM

## 2023-04-27 MED ORDER — AZELASTINE HCL 0.1 % NA SOLN: NASAL | 1 refills | Status: AC

## 2023-04-27 NOTE — Telephone Encounter (Signed)
Prescription Request  04/27/2023  LOV: 07/14/2022  What is the name of the medication or equipment? amphetamine-dextroamphetamine (ADDERALL) 20 MG tablet  and azelastine (ASTELIN) 0.1 % nasal spray   Have you contacted your pharmacy to request a refill? No   Which pharmacy would you like this sent to?  [id number] - [place], [place] - [address] [facility] [address] [place] [address] Phone: [phone] Fax: [phone]    Patient notified that their request is being sent to the clinical staff for review and that they should receive a response within 2 business days.   Please advise at Mobile [phone] (mobile)

## 2023-04-27 NOTE — Telephone Encounter (Signed)
Pt LOV was 07/14/22 Pt last refill was done on 12/02/22 Please advise

## 2023-04-28 MED ORDER — AMPHETAMINE-DEXTROAMPHETAMINE 20 MG PO TABS: 20.0000 mg | ORAL_TABLET | Freq: Two times a day (BID) | ORAL | 0 refills | Status: DC

## 2023-04-28 NOTE — Telephone Encounter (Signed)
Done

## 2023-04-28 NOTE — Telephone Encounter (Signed)
Pt LOV was on 07/14/22 Last refill was done on 10/22/22 Please advise

## 2023-05-11 ENCOUNTER — Ambulatory Visit: Payer: 59 | Admitting: Psychiatry

## 2023-05-11 DIAGNOSIS — F411 Generalized anxiety disorder: Secondary | ICD-10-CM | POA: Diagnosis not present
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
## 2023-05-30 ENCOUNTER — Other Ambulatory Visit: Payer: Self-pay

## 2023-05-30 ENCOUNTER — Emergency Department (HOSPITAL_COMMUNITY): Payer: 59

## 2023-05-30 ENCOUNTER — Emergency Department (HOSPITAL_COMMUNITY)
Admission: EM | Admit: 2023-05-30 | Discharge: 2023-05-30 | Disposition: A | Discharge status: Home / Self Care | Payer: 59 | Attending: Emergency Medicine | Admitting: Emergency Medicine

## 2023-05-30 DIAGNOSIS — Y9301 Activity, walking, marching and hiking: Secondary | ICD-10-CM | POA: Diagnosis not present

## 2023-05-30 DIAGNOSIS — S0990XA Unspecified injury of head, initial encounter: Secondary | ICD-10-CM

## 2023-05-30 DIAGNOSIS — W010XXA Fall on same level from slipping, tripping and stumbling without subsequent striking against object, initial encounter: Secondary | ICD-10-CM | POA: Insufficient documentation

## 2023-05-30 DIAGNOSIS — W19XXXA Unspecified fall, initial encounter: Secondary | ICD-10-CM

## 2023-05-30 DIAGNOSIS — S0181XA Laceration without foreign body of other part of head, initial encounter: Secondary | ICD-10-CM | POA: Insufficient documentation

## 2023-05-30 LAB — CBC WITH DIFFERENTIAL/PLATELET
Abs Immature Granulocytes: 0.05 10*3/uL (ref 0.00–0.07)
Basophils Absolute: 0.1 10*3/uL (ref 0.0–0.1)
Basophils Relative: 1 %
Eosinophils Absolute: 0 10*3/uL (ref 0.0–0.5)
Eosinophils Relative: 0 %
HCT: 38 % (ref 36.0–46.0)
Hemoglobin: 13.1 g/dL (ref 12.0–15.0)
Immature Granulocytes: 1 %
Lymphocytes Relative: 16 %
Lymphs Abs: 1.7 10*3/uL (ref 0.7–4.0)
MCH: 33.3 pg (ref 26.0–34.0)
MCHC: 34.5 g/dL (ref 30.0–36.0)
MCV: 96.7 fL (ref 80.0–100.0)
Monocytes Absolute: 0.5 10*3/uL (ref 0.1–1.0)
Monocytes Relative: 5 %
Neutro Abs: 8.3 10*3/uL — ABNORMAL HIGH (ref 1.7–7.7)
Neutrophils Relative %: 77 %
Platelets: 173 10*3/uL (ref 150–400)
RBC: 3.93 MIL/uL (ref 3.87–5.11)
RDW: 12.9 % (ref 11.5–15.5)
WBC: 10.7 10*3/uL — ABNORMAL HIGH (ref 4.0–10.5)
nRBC: 0 % (ref 0.0–0.2)

## 2023-05-30 LAB — BASIC METABOLIC PANEL
Anion gap: 13 (ref 5–15)
BUN: 19 mg/dL (ref 6–20)
CO2: 25 mmol/L (ref 22–32)
Calcium: 8.9 mg/dL (ref 8.9–10.3)
Chloride: 99 mmol/L (ref 98–111)
Creatinine, Ser: 0.83 mg/dL (ref 0.44–1.00)
GFR, Estimated: >60 mL/min (ref >60)
Glucose, Bld: 106 mg/dL — ABNORMAL HIGH (ref 70–99)
Potassium: 4 mmol/L (ref 3.5–5.1)
Sodium: 137 mmol/L (ref 135–145)

## 2023-05-30 MED ORDER — BACITRACIN ZINC 500 UNIT/GM EX OINT
TOPICAL_OINTMENT | Freq: Once | CUTANEOUS | Status: AC
  Filled 2023-05-30: qty 0.9

## 2023-05-30 MED ORDER — LIDOCAINE-EPINEPHRINE (PF) 2 %-1:200000 IJ SOLN
20.0000 mL | Freq: Once | INTRAMUSCULAR | Status: AC
  Administered 2023-05-30: 20 mL via INTRADERMAL
  Filled 2023-05-30: qty 20

## 2023-05-30 MED ORDER — ONDANSETRON 4 MG PO TBDP: 4.0000 mg | ORAL_TABLET | Freq: Three times a day (TID) | ORAL | 0 refills | Status: AC | PRN

## 2023-05-30 MED ORDER — ONDANSETRON HCL 4 MG/2ML IJ SOLN
4.0000 mg | Freq: Once | INTRAMUSCULAR | Status: DC
  Filled 2023-05-30: qty 2

## 2023-05-30 NOTE — ED Provider Notes (Signed)
[place] EMERGENCY DEPARTMENT AT [facility] Provider Note  CSN: [id number] Arrival date & time: 05/30/23 8295  Chief Complaint(s) Fall  HPI Sarah Hall is a 62 y.o. female who presents to the emergency department after mechanical fall this evening.  Patient was walking to the car when she tripped and fell face forward.  She did admit to drinking 3 glasses of wine this evening.  She is not anticoagulated.  She sustained laceration to the right forehead.  There was no loss of consciousness.  Patient was lifted by family and taken home.  Since the laceration would not stop bleeding, she was brought in by family.  Patient denies any headache, neck pain, back pain, chest pain, abdominal pain or extremity pain.  The history is provided by the patient.    Past Medical History No past medical history on file. There are no problems to display for this patient.  Home Medication(s) Prior to Admission medications   Medication Sig Start Date End Date Taking? Authorizing Provider  amphetamine-dextroamphetamine (ADDERALL) 20 MG tablet Take 20 mg by mouth daily. 04/28/23  Yes [provider]  Aspirin-Salicylamide-Caffeine (BC HEADACHE PO) Take 1 packet by mouth daily as needed (pain).   Yes [provider]  azelastine (ASTELIN) 0.1 % nasal spray Place 2 sprays into both nostrils 2 (two) times daily. 04/27/23  Yes [provider]  escitalopram (LEXAPRO) 10 MG tablet Take 10 mg by mouth daily. 05/24/23  Yes [provider]  levothyroxine (SYNTHROID) 88 MCG tablet Take 88 mcg by mouth daily before breakfast. 02/16/23  Yes [provider]  lisinopril (ZESTRIL) 10 MG tablet Take 10 mg by mouth daily. 05/03/23  Yes [provider]  ondansetron (ZOFRAN-ODT) 4 MG disintegrating tablet Take 1 tablet (4 mg total) by mouth every 8 (eight) hours as needed for up to 3 days for nausea or vomiting. 05/30/23 06/02/23 Yes Khylan Sawyer, Amadeo Garnet, MD   temazepam (RESTORIL) 30 MG capsule Take 30 mg by mouth at bedtime. 04/29/23  Yes [provider]  traMADol (ULTRAM) 50 MG tablet Take 50 mg by mouth 3 (three) times daily as needed for moderate pain (pain score 4-6). 04/05/23  Yes [provider]                                                                                                                                    Allergies Patient has no known allergies.  Review of Systems Review of Systems As noted in HPI  Physical Exam Vital Signs  I have reviewed the triage vital signs BP 122/80 (BP Location: Left Arm)   Pulse 70   Temp (!) 97.3 F (36.3 C) (Oral)   Resp 17   SpO2 97%   Physical Exam Constitutional:      General: She is not in acute distress.    Appearance: She is well-developed. She is not diaphoretic.  HENT:  Head: Normocephalic. Laceration present.      Right Ear: External ear normal.     Left Ear: External ear normal.     Nose: Nose normal.  Eyes:     General: No scleral icterus.       Right eye: No discharge.        Left eye: No discharge.     Conjunctiva/sclera: Conjunctivae normal.     Pupils: Pupils are equal, round, and reactive to light.  Cardiovascular:     Rate and Rhythm: Normal rate and regular rhythm.     Pulses:          Radial pulses are 2+ on the right side and 2+ on the left side.       Dorsalis pedis pulses are 2+ on the right side and 2+ on the left side.     Heart sounds: Normal heart sounds. No murmur heard.    No friction rub. No gallop.  Pulmonary:     Effort: Pulmonary effort is normal. No respiratory distress.     Breath sounds: Normal breath sounds. No stridor. No wheezing.  Abdominal:     General: There is no distension.     Palpations: Abdomen is soft.     Tenderness: There is no abdominal tenderness.  Musculoskeletal:        General: No tenderness.     Cervical back: Normal range of motion and neck supple. No bony tenderness.     Thoracic back: No  bony tenderness.     Lumbar back: No bony tenderness.     Comments: Clavicles stable. Chest stable to AP/Lat compression. Pelvis stable to Lat compression. No obvious extremity deformity. No chest or abdominal wall contusion.  Skin:    General: Skin is warm and dry.     Findings: No erythema or rash.  Neurological:     Mental Status: She is alert and oriented to person, place, and time.     Comments: Moving all extremities     ED Results and Treatments Labs (all labs ordered are listed, but only abnormal results are displayed) Labs Reviewed  CBC WITH DIFFERENTIAL/PLATELET - Abnormal; Notable for the following components:      Result Value   WBC 10.7 (*)    Neutro Abs 8.3 (*)    All other components within normal limits  BASIC METABOLIC PANEL - Abnormal; Notable for the following components:   Glucose, Bld 106 (*)    All other components within normal limits                                                                                                                         EKG  EKG Interpretation Date/Time:    Ventricular Rate:    PR Interval:    QRS Duration:    QT Interval:    QTC Calculation:   R Axis:      Text Interpretation:         Radiology CT Cervical Spine  Wo Contrast  Result Date: 05/30/2023 CLINICAL DATA:  Neck trauma, intoxicated or obtunded (Age >= 16y). Fall. EXAM: CT CERVICAL SPINE WITHOUT CONTRAST TECHNIQUE: Multidetector CT imaging of the cervical spine was performed without intravenous contrast. Multiplanar CT image reconstructions were also generated. RADIATION DOSE REDUCTION: This exam was performed according to the departmental dose-optimization program which includes automated exposure control, adjustment of the mA and/or kV according to patient size and/or use of iterative reconstruction technique. COMPARISON:  None available FINDINGS: Alignment: No subluxation Skull base and vertebrae: No acute fracture. No primary bone lesion or focal  pathologic process. Soft tissues and spinal canal: No prevertebral fluid or swelling. No visible canal hematoma. Disc levels: Degenerative disc disease changes most pronounced in the mid to lower cervical spine. Moderate to advanced degenerative facet disease, left greater than right. Upper chest: No acute findings. Other: None IMPRESSION: No acute bony abnormality. Electronically Signed   By: Charlett Nose M.D.   On: 05/30/2023 02:10   CT Head Wo Contrast  Result Date: 05/30/2023 CLINICAL DATA:  Fall. Head trauma, intracranial venous injury suspected EXAM: CT HEAD WITHOUT CONTRAST TECHNIQUE: Contiguous axial images were obtained from the base of the skull through the vertex without intravenous contrast. RADIATION DOSE REDUCTION: This exam was performed according to the departmental dose-optimization program which includes automated exposure control, adjustment of the mA and/or kV according to patient size and/or use of iterative reconstruction technique. COMPARISON:  None Available. FINDINGS: Brain: No acute intracranial abnormality. Specifically, no hemorrhage, hydrocephalus, mass lesion, acute infarction, or significant intracranial injury. Vascular: No hyperdense vessel or unexpected calcification. Skull: No acute calvarial abnormality. Sinuses/Orbits: No acute findings Other: Soft tissue swelling in the right forehead IMPRESSION: No acute intracranial abnormality. Electronically Signed   By: Charlett Nose M.D.   On: 05/30/2023 02:09    Medications Ordered in ED Medications  ondansetron (ZOFRAN) injection 4 mg (4 mg Intravenous Patient Refused/Not Given 05/30/23 0355)  bacitracin ointment (has no administration in time range)  lidocaine-EPINEPHrine (XYLOCAINE W/EPI) 2 %-1:200000 (PF) injection 20 mL (20 mLs Intradermal Given 05/30/23 0328)   Procedures .Laceration Repair  Date/Time: 05/30/2023 3:52 AM  Performed by: Nira Conn, MD Authorized by: Nira Conn, MD   Consent:     Consent obtained:  Verbal   Risks discussed:  Poor cosmetic result, pain and poor wound healing   Alternatives discussed:  Delayed treatment Universal protocol:    Imaging studies available: yes     Patient identity confirmed:  Arm band and verbally with patient Anesthesia:    Anesthesia method:  Local infiltration   Local anesthetic:  Lidocaine 2% WITH epi Laceration details:    Location:  Face   Face location:  R eyebrow   Length (cm):  1.6   Depth (mm):  4 Pre-procedure details:    Preparation:  Imaging obtained to evaluate for foreign bodies and patient was prepped and draped in usual sterile fashion Exploration:    Hemostasis achieved with:  Direct pressure   Imaging obtained: x-ray     Imaging outcome: foreign body not noted     Wound extent: fascia not violated   Treatment:    Area cleansed with:  Saline   Amount of cleaning:  Extensive   Irrigation solution:  Sterile saline   Irrigation volume:  500cc   Irrigation method:  Pressure wash   Debridement:  Minimal   Layers/structures repaired:  Deep subcutaneous Deep subcutaneous:    Suture size:  3-0   Suture material:  Vicryl   Suture technique:  Buried horizontal mattress   Number of sutures:  2 Skin repair:    Repair method:  Sutures   Suture size:  5-0   Suture technique:  Running locked   Number of sutures:  5 Approximation:    Approximation:  Close Repair type:    Repair type:  Complex Post-procedure details:    Dressing:  Non-adherent dressing and antibiotic ointment   Procedure completion:  Tolerated   (including critical care time) Medical Decision Making / ED Course   Medical Decision Making Amount and/or Complexity of Data Reviewed Labs: ordered. Radiology: ordered.  Risk Prescription drug management.    Mechanical fall resulting in right forehead laceration.  There irrigated closed as above.  CT head obtained to rule out ICH given EtOH.  CT head was negative.  Cervical spine CT also obtain  and ruled out cervical fracture.  No other injuries noted on exam requiring imaging or workup.  Labs are reassuring.    Final Clinical Impression(s) / ED Diagnoses Final diagnoses:  Fall, initial encounter  Minor head injury, initial encounter  Facial laceration, initial encounter   The patient appears reasonably screened and/or stabilized for discharge and I doubt any other medical condition or other [facility] requiring further screening, evaluation, or treatment in the ED at this time. I have discussed the findings, Dx and Tx plan with the patient/family who expressed understanding and agree(s) with the plan. Discharge instructions discussed at length. The patient/family was given strict return precautions who verbalized understanding of the instructions. No further questions at time of discharge.  Disposition: Discharge  Condition: Good  ED Discharge Orders          Ordered    ondansetron (ZOFRAN-ODT) 4 MG disintegrating tablet  Every 8 hours PRN        05/30/23 0401             Follow Up: Nelwyn Salisbury, MD [address] [place] [address] [phone]  Call  to schedule an appointment for close follow up  Peggye Form, DO [address] [place] [address] [phone]   schedule an appointment in 1 year if you desire evaluation for scar revision.    This chart was dictated using voice recognition software.  Despite best efforts to proofread,  errors can occur which can change the documentation meaning.    Nira Conn, MD 05/30/23 [phone]

## 2023-05-30 NOTE — Discharge Instructions (Addendum)
Do not let your laceration (cut) get wet for the next 48 hours. After that you may allow soapy water to drain down the wound to clean it.  Please do not scrub.  Do not submerge the wound under water for the next 2 weeks.  To minimize scarring, you can apply a vaseline based ointment for the next 2 weeks and keep it out of direct sun light. After that, you may apply sunscreen for the next several months.  Your stitches will dissolve on their own.   Return if your wound appears to be infected (see laceration care instructions).

## 2023-05-30 NOTE — ED Triage Notes (Signed)
Pt brought by the ems, from home with complaints of fall. As per pt she went to a [employer] had 3 glasses of wine and when she about to go home she tripped at the drive way and landed to her face. Hematoma and bruising at right periorbital. Not on thinners, denies loc.

## 2023-05-31 ENCOUNTER — Ambulatory Visit: Payer: 59

## 2023-06-04 ENCOUNTER — Ambulatory Visit: Payer: 59 | Admitting: Family Medicine

## 2023-06-04 ENCOUNTER — Encounter: Payer: Self-pay | Admitting: Family Medicine

## 2023-06-04 VITALS — BP 100/60 | HR 83 | Temp 97.7°F | Wt 136.0 lb

## 2023-06-04 DIAGNOSIS — S060X1D Concussion with loss of consciousness of 30 minutes or less, subsequent encounter: Secondary | ICD-10-CM

## 2023-06-04 DIAGNOSIS — S0181XD Laceration without foreign body of other part of head, subsequent encounter: Secondary | ICD-10-CM

## 2023-06-04 NOTE — Progress Notes (Signed)
   Subjective:    Patient ID: Sarah Hall, female    DOB: [birth year], 62 y.o.   MRN: [id number]  HPI Here to follow up on an ED visit on 05-30-23 for injuries from a fall. That evening she had attended a party where she drank alcohol but had not eaten all day. As she was walking to the parking lot she tripped and fell forward. Her right forehead struck something and knocked her out briefly. Her friends witness this and immediately tried to help her. Apparently she was unresponsive for a few minutes and then she came to. She was bleeding a lot so EMS was called to transport her to the ED. There her exam was normal other than the laceration. EKG and labs were normal. CT scans of her head and cervical spine were normal. The wound was closed with Vicryl sutures. Since going home she has had soreness over the right eyebrow and numbness over the right forehead. She gets lightheaded and nauseated at times. No headaches. No vision problems.    Review of Systems  Constitutional: Negative.   Respiratory: Negative.    Cardiovascular: Negative.   Musculoskeletal: Negative.   Skin:  Positive for wound.  Neurological:  Positive for light-headedness. Negative for dizziness, tremors, seizures, facial asymmetry, speech difficulty, weakness, numbness and headaches.       Objective:   Physical Exam Constitutional:      General: She is not in acute distress. HENT:     Head:     Comments: There is a laceration just superior to the right eyebrow shaped like an upside down "T". The wound is closed and clean. There are extensive ecchymoses on the right forehead, right cheek, and right jaw  Neurological:     Mental Status: She is alert.           Assessment & Plan:  She is recovering from a concussion and a facial laceration. She is to rest and avoid driving for the next week. She has been referred to Plastic Surgery for some time next week. We spent a total of ( 34  ) minutes reviewing records and  discussing these issues.  Gershon Crane, MD

## 2023-06-28 ENCOUNTER — Ambulatory Visit: Payer: 59 | Admitting: Psychiatry

## 2023-07-05 ENCOUNTER — Ambulatory Visit: Payer: 59 | Attending: Surgery

## 2023-07-05 VITALS — Wt 136.5 lb

## 2023-07-05 DIAGNOSIS — Z483 Aftercare following surgery for neoplasm: Secondary | ICD-10-CM | POA: Insufficient documentation

## 2023-07-05 NOTE — Therapy (Signed)
 OUTPATIENT PHYSICAL THERAPY SOZO SCREENING NOTE   Patient Name: Sarah Hall MRN: [id number] DOB:1960/11/21, 63 y.o., female Today's Date: 07/05/2023  PCP: Johnny Garnette LABOR, MD REFERRING PROVIDER: Belinda Cough, MD   PT End of Session - 07/05/23 1054     Visit Number 2   # unchanged due to screen only   PT Start Time 1053    PT Stop Time 1057    PT Time Calculation (min) 4 min    Activity Tolerance Patient tolerated treatment well    Behavior During Therapy WFL for tasks assessed/performed             Past Medical History:  Diagnosis Date   ADHD (attention deficit hyperactivity disorder), inattentive type    Ankle sprain    right ankle   Breast cancer (HCC)    Deep breathing    hard to take a deep breath due to thyroid  issue   Depression    Bipolar depression, sees Sonny Bray NP    Hypothyroidism    cared for by Dr. Gorge    Personal history of radiation therapy    Past Surgical History:  Procedure Laterality Date   BREAST BIOPSY Right 05/14/2020   BREAST CYST EXCISION Right 07/05/2020   Procedure: EVACUATION OF RIGHT BREAST HEMATOMA;  Surgeon: Belinda Cough, MD;  Location: [facility] OR;  Service: General;  Laterality: Right;  LMA   BREAST LUMPECTOMY Right 07/02/2020   BREAST LUMPECTOMY WITH RADIOACTIVE SEED AND SENTINEL LYMPH NODE BIOPSY Right 07/02/2020   Procedure: RIGHT BREAST LUMPECTOMY WITH RADIOACTIVE SEED AND RIGHT AXILLARY SENTINEL LYMPH NODE BIOPSY, BLUE DYE INJECTION;  Surgeon: Belinda Cough, MD;  Location: [facility];  Service: General;  Laterality: Right;  PEC BLOCK, BLUE DYE INJECTION   COLONOSCOPY  09/03/2014   per Dr. Aneita, benign polyps, repeat in 10 yrs    DILATION AND EVACUATION      2 times   scraping of uterus     20 years ago   WISDOM TOOTH EXTRACTION     Patient Active Problem List   Diagnosis Date Noted   Anxiety 07/14/2022   ADHD (attention deficit hyperactivity disorder), inattentive type 06/01/2022   Ankle edema,  bilateral 06/01/2022   IBS (irritable bowel syndrome) 06/10/2021   Raynaud's syndrome 06/10/2021   Primary hypertension 10/02/2020   Ductal carcinoma in situ (DCIS) of right breast 05/20/2020   Acquired hypothyroidism 06/09/2018   Seasonal allergies 06/09/2018   Depression, recurrent (HCC) 06/09/2018   Insomnia 01/15/2015   Low back pain 08/20/2014   Allergic rhinitis 02/22/2014   Perimenopausal vasomotor symptoms 12/31/2011   Fibrocystic breast disease 04/14/2011    REFERRING DIAG: right breast cancer at risk for lymphedema  THERAPY DIAG: Aftercare following surgery for neoplasm  PERTINENT HISTORY: Patient was diagnosed on 05/15/2020 with right DCIS. She underwent a right lumpectomy and sentinel node biopsy on 07/02/2020 with 2 negative nodes removed. It is ER positive and PR negative.  PRECAUTIONS: right UE Lymphedema risk, None  SUBJECTIVE: Pt returns for her first 6 month L-Dex screen.   PAIN:  Are you having pain? No  SOZO SCREENING: Patient was assessed today using the SOZO machine to determine the lymphedema index score. This was compared to her baseline score. It was determined that she is within the recommended range when compared to her baseline and no further action is needed at this time. She will continue SOZO screenings. These are done every 3 months for 2 years post operatively followed by every 6  months for 2 years, and then annually.   L-DEX FLOWSHEETS - 07/05/23 1000       L-DEX LYMPHEDEMA SCREENING   Measurement Type Unilateral    L-DEX MEASUREMENT EXTREMITY Upper Extremity    POSITION  Standing    DOMINANT SIDE Right    At Risk Side Right    BASELINE SCORE (UNILATERAL) -1.2    L-DEX SCORE (UNILATERAL) 0.5    VALUE CHANGE (UNILAT) 1.7            P: Cont 6 month SOZO screens next.   [name], PTA 07/05/2023, 10:57 AM

## 2023-07-18 ENCOUNTER — Other Ambulatory Visit: Payer: Self-pay | Admitting: Family Medicine

## 2023-07-25 ENCOUNTER — Other Ambulatory Visit: Payer: Self-pay | Admitting: Family Medicine

## 2023-07-28 ENCOUNTER — Other Ambulatory Visit: Payer: Self-pay | Admitting: Obstetrics and Gynecology

## 2023-07-28 DIAGNOSIS — Z9889 Other specified postprocedural states: Secondary | ICD-10-CM

## 2023-07-29 ENCOUNTER — Ambulatory Visit: Payer: 59 | Admitting: Psychiatry

## 2023-07-29 DIAGNOSIS — F411 Generalized anxiety disorder: Secondary | ICD-10-CM

## 2023-07-29 NOTE — Progress Notes (Signed)
 [facility] Counselor/Therapist Progress Note  Patient ID: Sarah Hall, MRN: [id number],    Date: 07/29/2023  Time Spent: 50 minutes   Treatment Type: Individual Therapy  Reported Symptoms: anxiety, stressed, motivational challenges, mild sadnesss, depression, difficulty focusing    Mental Status Exam:  Appearance:   Casual     Behavior:  Appropriate and Sharing  Motor:  Normal  Speech/Language:   Clear and Coherent  Affect:  Depressed and anxious  Mood:  anxious and depressed  Thought process:  goal directed  Thought content:    WNL  Sensory/Perceptual disturbances:    WNL  Orientation:  oriented to person, place, time/date, situation, day of week, month of year, year, and stated date of Feb. 6, 2025  Attention:  Fair  Concentration:  Good and Fair  Memory:  WNL  Fund of knowledge:   Good  Insight:    Good and Fair  Judgment:   Good  Impulse Control:  Good and Fair   Risk Assessment: Danger to Self:  No Self-injurious Behavior: No Danger to Others: No Duty to Warn:no Physical Aggression / Violence:No  Access to Firearms a concern: No  Gang Involvement:No   Subjective:   Patient reporting today symptoms of anxiety, depressed, stressed, lower motivation,sadness, and difficulty focusing. Depressed, emotionally vulnerable. Mother died this past week with multiple med issues that was very challenging but difficult for her due to her aging. Patient needed most of session to share and talk through a lot of grief, sadness,  and eventually focusing more on the positives and the things about her mom for which she is grateful. Hurt by adult son in midst of increased stress and death of patient's mother, as son was so self-absorbed that patient was hurt by son's lack of checking in with patient and her dying mother and son's grandmother. Good, thorough processing a lot of thoughts, emotions, disappointment, and grief. Son made more hurtful comments and has found herself  struggling with that hurt along with the grief of her mom's death.  Frustrated, angry, and was able to talk more directly with son recently regarding his decreased respect and lack of helpfulness during the past week.  Continues to consider setting healthier limits in certain family relationships.  Trying to make better choices and think things through more without being impulsive and having more acceptance in various areas of her life.  (Not all details included in this note due to patient privacy needs).  Does seem to be gaining more self-confidence and thinking things through more clearly without rushing to judgment.  Did encourage her to continue healthy participation with friends, pace herself, believe more in herself, concentrate on making good choices, trying to see more of her progress rather than what she perceives as lack of progress or negatives.  Interventions: Cognitive Behavioral Therapy and Ego-Supportive  Long term goal: Develop healthy cognitive patterns and beliefs about self and the world that lead to alleviation and help prevent the relapse of depression symptoms. Short-term goal: Verbalize an understanding of the relationship between repressed anger and depressed mood.  Strategies: Use positive conflict resolution skills to resolve interpersonal discord and to make needs and expectations known.   Diagnosis:   ICD-10-CM   1. Generalized anxiety disorder  F41.1      Plan:   Patient today in session working further on her anxiety, some depression, and motivation, but more importantly her grief and loss over the death of her mother this past week, who is  61 years old but very sharp mentally for her age and clear spoken.  Patient felt very grateful to have had time over the last week of her mother's life to interact with her and for her mom to realize that she was there with her. Reminded and encouraged patient in her personal growth and coping skills including: Believing more in  herself and her abilities to continue making significant changes, positive self talk, good self-care, stay in the present and be focused more on what she can change versus cannot, healthy nutrition and exercise, intentionally working to let go of the past in order to move forward, stay in contact with supportive people, emphasize wise decision making, saying no when she needs to say no, accentuate the positives especially within herself, and recognize the strength she shows when working with goal-directed behaviors to move in a direction that supports her improved overall emotional health and wellbeing.  She has definitely shown progress and needs to continue focusing on goal-directed behaviors in order to continue the work as stated above with goal-directed behaviors to move in a more hopeful and healthier direction.  Goal review and progress/challenges noted with patient.  Next appointment within 4 to 6 weeks.  Barnie Bunde, LCSW

## 2023-08-04 ENCOUNTER — Ambulatory Visit
Admission: RE | Admit: 2023-08-04 | Discharge: 2023-08-04 | Disposition: A | Discharge status: Home / Self Care | Payer: 59 | Source: Ambulatory Visit | Attending: Obstetrics and Gynecology | Admitting: Obstetrics and Gynecology

## 2023-08-04 DIAGNOSIS — Z9889 Other specified postprocedural states: Secondary | ICD-10-CM

## 2023-08-24 ENCOUNTER — Ambulatory Visit: Payer: 59 | Admitting: Psychiatry

## 2023-08-24 DIAGNOSIS — F411 Generalized anxiety disorder: Secondary | ICD-10-CM | POA: Diagnosis not present
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
## 2023-09-24 ENCOUNTER — Other Ambulatory Visit: Payer: Self-pay | Admitting: Family Medicine

## 2023-09-24 NOTE — Telephone Encounter (Signed)
 Copied from CRM [id number]. Topic: Clinical - Medication Refill >> Sep 24, 2023  1:58 PM Fredrich Romans wrote: Most Recent Primary Care Visit:  Provider: Gershon Crane A  Department: [facility]  Visit Type: OFFICE VISIT  Date: 06/04/2023  Medication:  amphetamine-dextroamphetamine (ADDERALL) 20 MG tablet   Has the patient contacted their pharmacy? Yes (Agent: If no, request that the patient contact the pharmacy for the refill. If patient does not wish to contact the pharmacy document the reason why and proceed with request.) (Agent: If yes, when and what did the pharmacy advise?)  Is this the correct pharmacy for this prescription? Yes If no, delete pharmacy and type the correct one.  This is the patient's preferred pharmacy:  [id number] [place], [place] - [address] AT [facility] [address] [place] [address] Phone: [phone] Fax: [phone]   Has the prescription been filled recently? Yes  Is the patient out of the medication? No(5 pills left)  Has the patient been seen for an appointment in the last year OR does the patient have an upcoming appointment? Yes  Can we respond through MyChart? Yes  Agent: Please be advised that Rx refills may take up to 3 business days. We ask that you follow-up with your pharmacy.

## 2023-09-27 MED ORDER — AMPHETAMINE-DEXTROAMPHETAMINE 20 MG PO TABS: 20.0000 mg | ORAL_TABLET | Freq: Two times a day (BID) | ORAL | 0 refills | Status: DC

## 2023-09-27 NOTE — Telephone Encounter (Signed)
 Done

## 2023-09-29 ENCOUNTER — Ambulatory Visit: Admitting: Psychiatry

## 2023-10-26 ENCOUNTER — Other Ambulatory Visit: Payer: Self-pay | Admitting: Family Medicine

## 2023-10-26 DIAGNOSIS — G4700 Insomnia, unspecified: Secondary | ICD-10-CM

## 2023-10-27 NOTE — Telephone Encounter (Signed)
 Last office visit was 06/04/2023

## 2023-11-25 ENCOUNTER — Other Ambulatory Visit: Payer: Self-pay | Admitting: Family Medicine

## 2023-11-25 DIAGNOSIS — J309 Allergic rhinitis, unspecified: Secondary | ICD-10-CM

## 2024-01-03 ENCOUNTER — Ambulatory Visit: Payer: 59 | Attending: Surgery

## 2024-01-03 VITALS — Wt 136.5 lb

## 2024-01-03 DIAGNOSIS — Z483 Aftercare following surgery for neoplasm: Secondary | ICD-10-CM | POA: Insufficient documentation
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
## 2024-03-06 NOTE — Progress Notes (Signed)
Orders placed in this encounter.

## 2024-03-17 ENCOUNTER — Other Ambulatory Visit: Payer: Self-pay | Admitting: Neurological Surgery

## 2024-03-17 DIAGNOSIS — M4317 Spondylolisthesis, lumbosacral region: Secondary | ICD-10-CM

## 2024-03-20 ENCOUNTER — Encounter: Payer: Self-pay | Admitting: Neurological Surgery

## 2024-03-27 ENCOUNTER — Ambulatory Visit
Admission: RE | Admit: 2024-03-27 | Discharge: 2024-03-27 | Disposition: A | Discharge status: Home / Self Care | Source: Ambulatory Visit | Attending: Neurological Surgery | Admitting: Neurological Surgery

## 2024-03-27 DIAGNOSIS — M4317 Spondylolisthesis, lumbosacral region: Secondary | ICD-10-CM

## 2024-04-04 ENCOUNTER — Ambulatory Visit: Admitting: Psychiatry

## 2024-04-04 DIAGNOSIS — F411 Generalized anxiety disorder: Secondary | ICD-10-CM
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
## 2024-04-14 ENCOUNTER — Other Ambulatory Visit: Payer: Self-pay | Admitting: Family Medicine

## 2024-04-14 NOTE — Telephone Encounter (Signed)
 Copied from CRM [id number]. Topic: Clinical - Medication Refill >> Apr 14, 2024  1:29 PM Ashley R wrote: Medication:  amphetamine -dextroamphetamine  (ADDERALL) 20 MG tablet   Has the patient contacted their pharmacy? Yes - completely out of refills (Agent: If no, request that the patient contact the pharmacy for the refill. If patient does not wish to contact the pharmacy document the reason why and proceed with request.) (Agent: If yes, when and what did the pharmacy advise?)  This is the patient's preferred pharmacy:  [id number] - [place], [place] - [address] AT [facility] [address] [place] [address] Phone: [phone] Fax: [phone]  Is this the correct pharmacy for this prescription? Yes If no, delete pharmacy and type the correct one.   Has the prescription been filled recently? Yes - 2 months ao  Is the patient out of the medication? Yes  Has the patient been seen for an appointment in the last year OR does the patient have an upcoming appointment? Yes  Can we respond through MyChart? No - prefer callback [phone]  Agent: Please be advised that Rx refills may take up to 3 business days. We ask that you follow-up with your pharmacy.

## 2024-04-19 ENCOUNTER — Telehealth: Payer: Self-pay | Admitting: *Deleted

## 2024-04-19 MED ORDER — AMPHETAMINE-DEXTROAMPHETAMINE 20 MG PO TABS: 20.0000 mg | ORAL_TABLET | Freq: Two times a day (BID) | ORAL | 0 refills | Status: AC

## 2024-04-19 NOTE — Telephone Encounter (Signed)
 Copied from CRM [id number]. Topic: Clinical - Medication Question >> Apr 18, 2024  4:16 PM Sarah Hall wrote: Reason for CRM: Patient called to check on the status of medication amphetamine -dextroamphetamine  (ADDERALL) 20 MG tablet being filled? Patient stated  that the request was sent in on Friday.Patient is completely out of the medication.

## 2024-04-19 NOTE — Telephone Encounter (Signed)
 Pt is requesting refill for Adderall Pt LOV was 06/04/23 Last refill done on 09/27/23 Please advise

## 2024-04-19 NOTE — Telephone Encounter (Signed)
 Done

## 2024-04-27 ENCOUNTER — Other Ambulatory Visit: Payer: Self-pay | Admitting: Family Medicine

## 2024-04-27 DIAGNOSIS — G4700 Insomnia, unspecified: Secondary | ICD-10-CM

## 2024-05-02 ENCOUNTER — Ambulatory Visit: Admitting: Psychiatry

## 2024-05-02 DIAGNOSIS — F411 Generalized anxiety disorder: Secondary | ICD-10-CM
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
## 2024-05-31 ENCOUNTER — Ambulatory Visit: Admitting: Psychiatry

## 2024-06-05 ENCOUNTER — Ambulatory Visit: Admitting: Psychiatry

## 2024-06-20 ENCOUNTER — Other Ambulatory Visit: Payer: Self-pay | Admitting: Family Medicine

## 2024-06-20 DIAGNOSIS — J309 Allergic rhinitis, unspecified: Secondary | ICD-10-CM

## 2024-06-26 ENCOUNTER — Ambulatory Visit: Admitting: Psychiatry

## 2024-06-26 DIAGNOSIS — F411 Generalized anxiety disorder: Secondary | ICD-10-CM
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
## 2024-07-03 ENCOUNTER — Ambulatory Visit: Attending: Surgery

## 2024-07-03 VITALS — Wt 131.5 lb

## 2024-07-03 DIAGNOSIS — Z483 Aftercare following surgery for neoplasm: Secondary | ICD-10-CM | POA: Insufficient documentation

## 2024-07-03 NOTE — Therapy (Signed)
 " OUTPATIENT PHYSICAL THERAPY SOZO SCREENING NOTE   Patient Name: Sarah Hall MRN: [id number] DOB:Oct 07, 1960, 64 y.o., female Today's Date: 07/03/2024  PCP: Johnny Garnette LABOR, MD REFERRING PROVIDER: Belinda Cough, MD   PT End of Session - 07/03/24 1525     Visit Number 2   # unchanged due to screen only   PT Start Time 1524    PT Stop Time 1528    PT Time Calculation (min) 4 min    Activity Tolerance Patient tolerated treatment well    Behavior During Therapy WFL for tasks assessed/performed          Past Medical History:  Diagnosis Date   ADHD (attention deficit hyperactivity disorder), inattentive type    Ankle sprain    right ankle   Breast cancer (HCC)    Deep breathing    hard to take a deep breath due to thyroid  issue   Depression    Bipolar depression, sees Sonny Bray NP    Hypothyroidism    cared for by Dr. Gorge    Personal history of radiation therapy    Past Surgical History:  Procedure Laterality Date   BREAST BIOPSY Right 05/14/2020   BREAST CYST EXCISION Right 07/05/2020   Procedure: EVACUATION OF RIGHT BREAST HEMATOMA;  Surgeon: Belinda Cough, MD;  Location: [facility] OR;  Service: General;  Laterality: Right;  LMA   BREAST LUMPECTOMY Right 07/02/2020   BREAST LUMPECTOMY WITH RADIOACTIVE SEED AND SENTINEL LYMPH NODE BIOPSY Right 07/02/2020   Procedure: RIGHT BREAST LUMPECTOMY WITH RADIOACTIVE SEED AND RIGHT AXILLARY SENTINEL LYMPH NODE BIOPSY, BLUE DYE INJECTION;  Surgeon: Belinda Cough, MD;  Location: [facility];  Service: General;  Laterality: Right;  PEC BLOCK, BLUE DYE INJECTION   COLONOSCOPY  09/03/2014   per Dr. Aneita, benign polyps, repeat in 10 yrs    DILATION AND EVACUATION      2 times   scraping of uterus     20 years ago   WISDOM TOOTH EXTRACTION     Patient Active Problem List   Diagnosis Date Noted   Anxiety 07/14/2022   ADHD (attention deficit hyperactivity disorder), inattentive type 06/01/2022   Ankle edema,  bilateral 06/01/2022   IBS (irritable bowel syndrome) 06/10/2021   Raynaud's syndrome 06/10/2021   Primary hypertension 10/02/2020   Ductal carcinoma in situ (DCIS) of right breast 05/20/2020   Acquired hypothyroidism 06/09/2018   Seasonal allergies 06/09/2018   Depression, recurrent 06/09/2018   Insomnia 01/15/2015   Low back pain 08/20/2014   Allergic rhinitis 02/22/2014   Perimenopausal vasomotor symptoms 12/31/2011   Fibrocystic breast disease 04/14/2011    REFERRING DIAG: right breast cancer at risk for lymphedema  THERAPY DIAG: Aftercare following surgery for neoplasm  PERTINENT HISTORY: Patient was diagnosed on 05/15/2020 with right DCIS. She underwent a right lumpectomy and sentinel node biopsy on 07/02/2020 with 2 negative nodes removed. It is ER positive and PR negative.  PRECAUTIONS: right UE Lymphedema risk, None  SUBJECTIVE: Pt returns for her last 6 month L-Dex screen.   PAIN:  Are you having pain? No  SOZO SCREENING: Patient was assessed today using the SOZO machine to determine the lymphedema index score. This was compared to her baseline score. It was determined that she is within the recommended range when compared to her baseline and no further action is needed at this time. She will continue SOZO screenings. These are done every 3 months for 2 years post operatively followed by every 6 months for 2  years, and then annually.   L-DEX FLOWSHEETS - 07/03/24 1500       L-DEX LYMPHEDEMA SCREENING   Measurement Type Unilateral    L-DEX MEASUREMENT EXTREMITY Upper Extremity    POSITION  Standing    DOMINANT SIDE Right    At Risk Side Right    BASELINE SCORE (UNILATERAL) -1.2    L-DEX SCORE (UNILATERAL) 2.3    VALUE CHANGE (UNILAT) 3.5         P: Transition to annual SOZO screens.  [name], PTA 07/03/2024, 3:28 PM    "

## 2024-07-26 ENCOUNTER — Other Ambulatory Visit: Payer: Self-pay

## 2024-07-27 ENCOUNTER — Other Ambulatory Visit: Payer: Self-pay

## 2024-08-03 ENCOUNTER — Encounter: Admitting: Vascular Surgery

## 2024-08-22 ENCOUNTER — Inpatient Hospital Stay (HOSPITAL_COMMUNITY): Admit: 2024-08-22

## 2024-08-24 ENCOUNTER — Ambulatory Visit: Admitting: Psychiatry

## 2025-07-02 ENCOUNTER — Ambulatory Visit
